# Patient Record
Sex: Female | Born: 1955 | Race: White | Hispanic: No | Marital: Married | Smoking: Never smoker
Health system: Southern US, Community
[De-identification: ages and names within clinical notes are randomized; demographics above are authoritative.]

## PROBLEM LIST (undated history)

## (undated) DIAGNOSIS — E119 Type 2 diabetes mellitus without complications: Secondary | ICD-10-CM

## (undated) DIAGNOSIS — Z923 Personal history of irradiation: Secondary | ICD-10-CM

## (undated) DIAGNOSIS — F319 Bipolar disorder, unspecified: Secondary | ICD-10-CM

## (undated) DIAGNOSIS — Z460 Encounter for fitting and adjustment of spectacles and contact lenses: Secondary | ICD-10-CM

## (undated) DIAGNOSIS — F419 Anxiety disorder, unspecified: Secondary | ICD-10-CM

## (undated) DIAGNOSIS — F32A Depression, unspecified: Secondary | ICD-10-CM

## (undated) DIAGNOSIS — F329 Major depressive disorder, single episode, unspecified: Secondary | ICD-10-CM

## (undated) DIAGNOSIS — C50919 Malignant neoplasm of unspecified site of unspecified female breast: Secondary | ICD-10-CM

## (undated) DIAGNOSIS — M1990 Unspecified osteoarthritis, unspecified site: Secondary | ICD-10-CM

## (undated) HISTORY — DX: Malignant neoplasm of unspecified site of unspecified female breast: C50.919

## (undated) HISTORY — DX: Anxiety disorder, unspecified: F41.9

## (undated) HISTORY — DX: Major depressive disorder, single episode, unspecified: F32.9

## (undated) HISTORY — DX: Bipolar disorder, unspecified: F31.9

## (undated) HISTORY — DX: Depression, unspecified: F32.A

## (undated) HISTORY — PX: WISDOM TOOTH EXTRACTION: SHX21

---

## 1994-07-27 HISTORY — PX: ABDOMINAL HYSTERECTOMY: SHX81

## 1999-05-22 ENCOUNTER — Encounter: Payer: Self-pay | Admitting: Gynecology

## 1999-05-22 ENCOUNTER — Ambulatory Visit (HOSPITAL_COMMUNITY): Admission: RE | Admit: 1999-05-22 | Discharge: 1999-05-22 | Payer: Self-pay | Admitting: Gynecology

## 1999-05-29 ENCOUNTER — Encounter: Payer: Self-pay | Admitting: Gynecology

## 1999-05-29 ENCOUNTER — Ambulatory Visit (HOSPITAL_COMMUNITY): Admission: RE | Admit: 1999-05-29 | Discharge: 1999-05-29 | Payer: Self-pay | Admitting: Gynecology

## 2000-06-01 ENCOUNTER — Encounter: Admission: RE | Admit: 2000-06-01 | Discharge: 2000-06-01 | Payer: Self-pay | Admitting: Gynecology

## 2000-06-01 ENCOUNTER — Encounter: Payer: Self-pay | Admitting: Gynecology

## 2000-07-27 HISTORY — PX: HARDWARE REMOVAL: SHX979

## 2000-07-27 HISTORY — PX: FOOT SURGERY: SHX648

## 2000-09-29 ENCOUNTER — Other Ambulatory Visit: Admission: RE | Admit: 2000-09-29 | Discharge: 2000-09-29 | Payer: Self-pay | Admitting: Gynecology

## 2001-01-31 ENCOUNTER — Observation Stay (HOSPITAL_COMMUNITY): Admission: RE | Admit: 2001-01-31 | Discharge: 2001-02-01 | Payer: Self-pay | Admitting: Orthopedic Surgery

## 2001-01-31 ENCOUNTER — Encounter: Payer: Self-pay | Admitting: Orthopedic Surgery

## 2001-05-03 ENCOUNTER — Ambulatory Visit (HOSPITAL_COMMUNITY): Admission: RE | Admit: 2001-05-03 | Discharge: 2001-05-03 | Payer: Self-pay | Admitting: Orthopedic Surgery

## 2001-05-03 ENCOUNTER — Encounter: Payer: Self-pay | Admitting: Orthopedic Surgery

## 2001-06-03 ENCOUNTER — Encounter: Payer: Self-pay | Admitting: Gynecology

## 2001-06-03 ENCOUNTER — Encounter: Admission: RE | Admit: 2001-06-03 | Discharge: 2001-06-03 | Payer: Self-pay | Admitting: Gynecology

## 2001-06-03 ENCOUNTER — Ambulatory Visit (HOSPITAL_COMMUNITY): Admission: RE | Admit: 2001-06-03 | Discharge: 2001-06-03 | Payer: Self-pay | Admitting: *Deleted

## 2001-11-09 ENCOUNTER — Other Ambulatory Visit: Admission: RE | Admit: 2001-11-09 | Discharge: 2001-11-09 | Payer: Self-pay | Admitting: Obstetrics & Gynecology

## 2001-11-14 ENCOUNTER — Encounter: Payer: Self-pay | Admitting: Obstetrics & Gynecology

## 2001-11-14 ENCOUNTER — Encounter: Admission: RE | Admit: 2001-11-14 | Discharge: 2001-11-14 | Payer: Self-pay | Admitting: Obstetrics & Gynecology

## 2002-11-28 ENCOUNTER — Other Ambulatory Visit: Admission: RE | Admit: 2002-11-28 | Discharge: 2002-11-28 | Payer: Self-pay | Admitting: Obstetrics & Gynecology

## 2004-01-03 ENCOUNTER — Other Ambulatory Visit: Admission: RE | Admit: 2004-01-03 | Discharge: 2004-01-03 | Payer: Self-pay | Admitting: Obstetrics & Gynecology

## 2005-05-04 ENCOUNTER — Other Ambulatory Visit: Admission: RE | Admit: 2005-05-04 | Discharge: 2005-05-04 | Payer: Self-pay | Admitting: Obstetrics & Gynecology

## 2008-11-29 ENCOUNTER — Encounter: Admission: RE | Admit: 2008-11-29 | Discharge: 2008-11-29 | Payer: Self-pay | Admitting: Obstetrics & Gynecology

## 2010-01-14 ENCOUNTER — Encounter: Admission: RE | Admit: 2010-01-14 | Discharge: 2010-01-14 | Payer: Self-pay | Admitting: Obstetrics & Gynecology

## 2010-10-14 ENCOUNTER — Other Ambulatory Visit: Payer: Self-pay | Admitting: Obstetrics & Gynecology

## 2010-10-14 DIAGNOSIS — N6320 Unspecified lump in the left breast, unspecified quadrant: Secondary | ICD-10-CM

## 2010-10-16 ENCOUNTER — Other Ambulatory Visit: Payer: Self-pay

## 2010-10-21 ENCOUNTER — Ambulatory Visit
Admission: RE | Admit: 2010-10-21 | Discharge: 2010-10-21 | Disposition: A | Discharge status: Home / Self Care | Payer: BC Managed Care – PPO | Source: Ambulatory Visit | Attending: Obstetrics & Gynecology | Admitting: Obstetrics & Gynecology

## 2010-10-21 ENCOUNTER — Other Ambulatory Visit: Payer: Self-pay | Admitting: Obstetrics & Gynecology

## 2010-10-21 DIAGNOSIS — N6320 Unspecified lump in the left breast, unspecified quadrant: Secondary | ICD-10-CM

## 2010-12-12 NOTE — Op Note (Signed)
[place]  Patient:    Caitlin Mcneil, Caitlin Mcneil                        MRN: [id number] Proc. Date: 01/31/01 Adm. Date:  [id number] Attending:  Leonides Grills A                           Operative Report  PREOPERATIVE DIAGNOSES: 1. Left hypermobile hallux valgus. 2. Left hallux interphalangeus. 3. Left second hammer toe.  POSTOPERATIVE DIAGNOSES: 1. Left hypermobile hallux valgus. 2. Left hallux interphalangeus. 3. Left second hammer toe.  OPERATION: 1. Left modified ________ procedure. 2. Left Akin osteotomy. 3. Second left dorsal MTP joint capsulotomy. 4. Second left proximal phalanx head resection. 5. Second left FDL to proximal phalanx transfer. 6. Second left EDB to EDL transfer. 7. Local bone graft.  ANESTHESIA:  General endotracheal tube.  SURGEON:  Sherri Rad, M.D.  ASSISTANT:  French Ana Shuford, P.A.-C.  ESTIMATED BLOOD LOSS:  Minimal.  TOURNIQUET TIME:  120 minutes.  COMPLICATIONS:  None.  DISPOSITION:  Stable to PR.  INDICATIONS:  The patient is a 56 year old female who has had bilateral forefoot pain.  She has had pain since February of 2000, and she has tried all conservative management which includes wider toe box shoes and anti-inflammatories.  She has had the second metatarsophalangeal joint injected before and she has had rigid-type deformity of the second hammer toe. She was consented for the above procedure.  All risks and benefits were explained which include infection, nerve or vessel injury, pain, numbness, nonunion, malunion, DVT, recurrence of deformity, and all questions were answered.  DESCRIPTION OF PROCEDURE:  The patient was brought to the operating room and placed in the supine position after adequate general endotracheal tube anesthesia was administered as well as vancomycin 1 g IV piggyback.  The left lower extremity was then prepped and draped in a sterile manner over a proximal placed thigh tourniquet.   A longitudinal incision through the first web space just lateral to the extensor hallucis longus was then made. Dissection was carried down to the first tarsometatarsal joint with an iris scissor, protecting the EHB and EHL retracted medially.  The first tarsometatarsal joint was then opened.  Cartilage was then removed on both sides of the joint.  An osteotomy was then made through the medial cuneiform laterally to remove the lateral wedge to decrease the first and second intermetatarsal angle.  Once this was done, the laminar spreader was then placed into the joint and the plantar aspect of the joint was then visualized.  The capsule was intact.  There was no bone left plantarly that would cause a dorsiflexion deformity at the tarsometatarsal joint.  The joint was then reduced.  A two point reduction clamp was then used to hold this reduction and then a 1.6 mm K-wire was then placed to hold this in the proper position.  After this was done, this was verified under C-arm guidance in the AP and lateral planes, as well as palpated to see if in fact the toe was plantar flexed enough.  It seemed enough and the forefoot seemed to be in a very well-corrected position.  We then dissected through the same incision, though distally and lateral to the first metatarsophalangeal joint capsule was then identified.  A Freer elevator was used to elevate the soft tissues off the lateral aspect of the capsule.  Adductor hallucis was identified.  Just superior to this, a capsulotomy was made laterally.  The first metatarsal head was then translated laterally through the capsular opening.  The adductor tendon was not released.  This had an excellent correction of the hallux valgus.  This had excellent correction of the hallux valgus deformity.  The first tarsometatarsal joint arthrodesis was then repositioned again by backing out the K-wire and removing the clamp, repositioned the clamp, and the K-wire  with the head in the proper position.  Once this was done, the K-wire was then fired across the holding reduction and again this was visualized under C-arm guidance.  The sesamoids were then reduced back underneath the metatarsal head.  There is still persistent interphalangeus deformity of the great toe.  There was also slight angle to the joint itself and it was decided at that point that I did not want to perform a distal metatarsal osteotomy for the fear to cause AVN of the metatarsal head.  It was at this point that an an Akin osteotomy was then made through the proximal phalanx.  This had an excellent correction of the hallux interphalangeus.  This was fixed with two 1.6 mm K-wires in a crisscross manner across the osteotomy.  Then, a bur was then used to create a notch at the base of the first metatarsal, and a ___ mm glide hole was then made into the base of the first metatarsal.  Once this was made in the first metatarsal, then a purchase hole was made into the medial cuneiform.  A 36 mm fully threaded 3.5 mm cortical screw was chosen.  After this was chosen, this was placed and had excellent compression of the first metatarsophalangeal joint fusion.  It was then at this point that the derotational screw was placed between the base of the first and second metatarsal.  This was done in a lag fashion again to close down slightly the lateral aspect of the osteotomy site.  This had held the joint in excellent position.  This again was a 3.5 mm fully threaded cortical screw was used.  We then performed a dorsal incision over the second toe.  Dissection was carried down through soft tissue with the iris scissor.  The EDB and EDL tendons to the second toe were identified.  It was cut longitudinally between the two tendons.  The EDL was tenotomized proximally and medially and the EDB was tenotomized distal laterally at the PIP joint.  The tendons were retracted laterally and medially  respectively and a dorsal capsulotomy of the second metatarsophalangeal joint was then made with collateral release both medially and laterally.   It was at this point that the head of the proximal phalanx was then removed. After that, a longitudinal incision through the plantar plane in the proximal interphalangeal joint was then made.  The flexor digitorum longus was identified, pulled through the wound, and cut.  This was then passed dorsally. A split in the midline of the tendon was then made and the slips of the tendon were passed both medially and laterally around the base of the proximal phalanx underneath the neurovascular bundle.  Once this was done, a K-wire was then placed in an antegrade manner through the middle and distal phalanx and at the tip of the toe.  It was then passed retrograde through the proximal phalanx.  It was at this point that the toe was held in reduced position.  The K-wire was then fired across the second metatarsophalangeal joint, and held in the  proper position with the ankle in neutral position.  With the ankle in neutral position at this point, the FDL tendon slips respectively were sewn together, transferring it to the proximal phalanx. the EDB was transferred then to EDL and sewn to the FDL tendon as well in this region.  The wounds were copiously irrigated with normal saline.  A ________ with bone graft obtained locally were then placed at the first metatarsophalangeal joint osteotomy site.  Final C-arm views were obtained in the AP and lateral position to verify the proper position of the fixation and alignment of the joints.  Earlier, I failed to mention this, a medial incision was made over the first metatarsophalangeal joint.  Dissection was carried down to capsule and ellipse of the capsule was made, and at the time of the end of the procedure, the ellipse was then closed in a vest-over-pants manner with further correction of the hallux  valgus slightly.  The wound was copiously irrigated with normal saline.  The subcutaneous was closed with 3-0 Vicryl.  The skin was closed with 3-0 nylon.  A sterile dressing was applied.  A Jones dressing was applied.  The patient was stable to PR.  POSTOPERATIVE COURSE:  The patient will remain on weightbearing for eight weeks and then weightbearing as tolerated in the Cam walker boot.  We will remove the K-wire of the second toe in five to six weeks.  We will remove the hardware in the proximal phalanx osteotomy as well as the derotational screw, most likely at three to four months. DD:  01/31/01 TD:  02/01/01 Job: [id number]

## 2010-12-12 NOTE — Op Note (Signed)
[facility]  Patient:    Caitlin Mcneil, Caitlin Mcneil Visit Number: [id number] MRN: [id number]          Service Type: DSU Location: DAY Attending Physician:  Sherri Rad Proc. Date: 05/03/01 Admit Date:  05/03/2001                             Operative Report  PREOPERATIVE DIAGNOSES:  Retained hardware, three months status post a left first tarsometatarsal joint correctional arthrodesis, modified McBride bunionectomy, and Aiken osteotomy.  POSTOPERATIVE DIAGNOSES:  Retained hardware, three months status post a left first tarsometatarsal joint correctional arthrodesis, modified McBride bunionectomy, and Aiken osteotomy.  OPERATION:  Derotational screw and K-wire removal deep left foot.  ANESTHESIA:  General endotracheal tube.  SURGEON:  Sherri Rad, M.D.  ASSISTANT:  French Ana Shuford, P.A.-C.  ESTIMATED BLOOD LOSS:  Minimal.  TOURNIQUET:  None.  COMPLICATIONS:  None.  DISPOSITION:  Stable to PR.  INDICATION:  This is a 55 year old female, who is now three months status post a left first tarsometatarsal joint correctional arthrodesis, modified McBride bunionectomy, and Aiken osteotomy.  She presents today for hardware removal. She was explained the risks, including infection, neurovascular injury, possible nonunion, malunion, loss of reduction, possible reimplantation, stiffness were all explained.  Questions were answered.  DESCRIPTION OF PROCEDURE:  The patient was brought to the operating room and placed in the supine position.  After adequate general endotracheal tube anesthesia had been administered as well as vancomycin IV piggyback, the left lower extremity was then prepped and draped in a sterile manner.  A thigh tourniquet was never used.  A medial incision over the head of the derotational screw through the old incision was made.  Screw was removed with the small fragment screwdriver without difficulty.  Screw was intact.  A lateral  incision through the previous wound dorsally was then made.  Toe was plantar flexed, K-wire was identified and pulled out, again, without difficulty.  It was intact without any evidence of failure.  Medial incision over the previous medial wound was then made directly to K-wire.  K-wire was then removed with the needle-nose pliers.  K-wire was intact without evidence of failure.  The wounds were copiously irrigated with normal saline.  Skin and fascia was closed with 4-0 nylon stitch.  Sterile dressing was applied.  The patient was stable to PR. Attending Physician:  Sherri Rad DD:  05/03/01 TD:  05/03/01 Job: [id number]

## 2010-12-12 NOTE — H&P (Signed)
[facility]  Patient:    Caitlin Mcneil, Caitlin Mcneil                        MRN: [id number] Adm. Date:  01/31/01 Attending:  Sherri Rad, M.D. Dictator:   Colleen P. Mahar, P.A.                         History and Physical  DATE OF BIRTH:  [birth year].  CHIEF COMPLAINT:  Left foot and toe pain.  HISTORY OF PRESENT ILLNESS:  Patient has been followed in our clinic for left foot hypermobile hallux valgus with forefoot metatarsalgia.  She obtained more comfortable shoes as well as the _____ temporary orthotic.  She has had persistent bunion pain to the point now that she had previously where it is interfering with her life to the point that she cannot do what she wants to do and needs to do.  She has tried wider shoes and sneakers without any relief. She can only walk for a short period of time at this point, and she has to sit down because of her pain.  This is interfering with her activities of daily living, and she is very frustrated at this point and ready to undergo surgical correction of this problem.  Risks and benefits were discussed with this patient by Dr. Lestine Box, and the patient still wanted to undergo the surgical procedure.  PAST MEDICAL HISTORY: 1. Chronic sinus infections. 2. Bipolar disorder.  PAST SURGICAL HISTORY: 1. Complete hysterectomy in 1996. 2. Tubal ligation in 1989.  ALLERGIES:  She has numerous allergies: 1. PENICILLIN, where she had an anaphylactic, "near-fatal reaction." 2. CEFTIN causes a rash. 3. DOXYCYCLINE causes a rash. 4. LEVAQUIN, rash. 5. ENTEX LA causes a rash. 6. SULFA causes hives and a rash. 7. ASPIRIN causes a rash. 8. ZITHROMAX causes a rash. 9. BIAXIN, rash. 10. IBUPROFEN, rash. 11. TEGRETOL, patient indicated this caused her white blood cell count to fall     drastically. 12. PIROXICAM causes a rash. 13. INDOCIN causes a rash.  MEDICATIONS:  Claritin D the 24 hour tablets one tablet q.d. as  needed, Lamictal 25 mg tablets two tablets q.h.s.  She takes a hormone, Vivelle 0.075 mg patch apply one patch two times weekly.  Estrace vaginal cream 2 g applied vaginally two times weekly.  Testosterone 4% PLO, apply small amount daily. She takes Benadryl 25 mg one time q.h.s. as needed.  FAMILY HISTORY:  Significant for Alzheimers disease, myocardial infarction, coronary artery disease, bipolar disorder, and colon cancer.  REVIEW OF SYSTEMS:  She denied any fevers, chills, night sweats, or bleeding tendencies.  Denied blurred vision, double vision, seizures, headache, or paralysis.  Denies shortness of breath, productive cough, hemoptysis.  Denied chest pain, angina, orthopnea, claudication.  Denied nausea and vomiting, constipation, diarrhea, melena, or bloody stool.  Denied dysuria, hematuria, discharge.  Denied any paresthesias or numbness in the extremities as well as any weakness in the muscles of the extremities.  SOCIAL HISTORY:  Patient denied any tobacco use, denied alcohol use.  She is married and has one 61 year old son.  PHYSICAL EXAMINATION:  VITAL SIGNS:  Blood pressure is 138/96, respirations 14 and unlabored, pulse is 84 and regular.  GENERAL:  The patient is a 55 year old white female in no acute distress.  She is alert and oriented, pleasant, and cooperative to examination.  HEENT:  Head is normocephalic, atraumatic.  Extraocular movement is  intact.  NECK:  No bruits appreciated.  Soft and supple to palpation.  No thyromegaly appreciated.  CHEST:  Clear to auscultation anterior and posterior bilaterally.  BREASTS:  Not pertinent, not examined.  CARDIAC:  Normal S1, S2, regular rate and rhythm.  No murmurs, gallops, or rubs appreciated.  ABDOMEN:  Positive bowel sounds throughout and nontender, nondistended, soft to palpation, no organomegaly appreciated.  GENITOURINARY:  Not pertinent, not examined.  EXTREMITIES:  Left foot:  She has an obvious  hypermobile hallux valgus with a prominent metatarsal head dorsal medially.  She has a transfer callus that is underneath the first metatarsal head on the left foot.  There is a slight rim callus on the tip of the left great toe.  She has a palpable dorsalis pedis and posterior tibialis pulse.  She has slight decreased sensation to light touch over the dorsal medial surface of the great toe as well as underneath the medial plantar nerve area.  The remaining portion of the foot is neurovascularly intact.  She does have a hammer toe on the second toe.  SKIN:  As noted above.  IMAGING STUDIES:  X-rays were reviewed and show a hallux valgus deformity, which has metatarsus primus varus.  There is a question if she has a DMMA angle that may need to be corrected at the time of the procedure with a distal metatarsal osteotomy.  IMPRESSION: 1. Left foot, hypermobile hallux valgus and second hammer toe. 2. Bipolar disorder. 3. Chronic sinus infections. 4. Penicillin allergy causing anaphylactic reaction. 5. Please also note that the patient did write a letter saying that due to    her significant allergy list, she has taken Keflex in the past for her    sinus infections and has not had any problems with that.  The patients    primary care physician is Dr. Georgina Pillion at [facility].  Her    psychiatrist is Dr. Archer Asa.  PLAN:  Admit to [facility] for 23-hour observation on January 31, 2001, and for the surgical correction of the above-listed problem by Dr. Leonides Grills. DD:  01/20/01 TD:  01/20/01 Job: [id number]

## 2012-07-27 DIAGNOSIS — Z923 Personal history of irradiation: Secondary | ICD-10-CM

## 2012-07-27 HISTORY — DX: Personal history of irradiation: Z92.3

## 2013-02-28 ENCOUNTER — Other Ambulatory Visit: Payer: Self-pay | Admitting: Obstetrics & Gynecology

## 2013-02-28 DIAGNOSIS — R928 Other abnormal and inconclusive findings on diagnostic imaging of breast: Secondary | ICD-10-CM

## 2013-03-15 ENCOUNTER — Ambulatory Visit
Admission: RE | Admit: 2013-03-15 | Discharge: 2013-03-15 | Disposition: A | Discharge status: Home / Self Care | Payer: BC Managed Care – PPO | Source: Ambulatory Visit | Attending: Obstetrics & Gynecology | Admitting: Obstetrics & Gynecology

## 2013-03-15 ENCOUNTER — Other Ambulatory Visit: Payer: Self-pay | Admitting: Obstetrics & Gynecology

## 2013-03-15 DIAGNOSIS — R921 Mammographic calcification found on diagnostic imaging of breast: Secondary | ICD-10-CM

## 2013-03-15 DIAGNOSIS — R928 Other abnormal and inconclusive findings on diagnostic imaging of breast: Secondary | ICD-10-CM

## 2013-03-30 ENCOUNTER — Ambulatory Visit
Admission: RE | Admit: 2013-03-30 | Discharge: 2013-03-30 | Disposition: A | Discharge status: Home / Self Care | Payer: BC Managed Care – PPO | Source: Ambulatory Visit | Attending: Obstetrics & Gynecology | Admitting: Obstetrics & Gynecology

## 2013-03-30 DIAGNOSIS — C50919 Malignant neoplasm of unspecified site of unspecified female breast: Secondary | ICD-10-CM

## 2013-03-30 DIAGNOSIS — R921 Mammographic calcification found on diagnostic imaging of breast: Secondary | ICD-10-CM

## 2013-03-30 HISTORY — PX: BREAST BIOPSY: SHX20

## 2013-03-30 HISTORY — DX: Malignant neoplasm of unspecified site of unspecified female breast: C50.919

## 2013-03-31 ENCOUNTER — Telehealth: Payer: Self-pay | Admitting: *Deleted

## 2013-03-31 ENCOUNTER — Other Ambulatory Visit: Payer: Self-pay | Admitting: Obstetrics & Gynecology

## 2013-03-31 DIAGNOSIS — D0511 Intraductal carcinoma in situ of right breast: Secondary | ICD-10-CM

## 2013-03-31 NOTE — Telephone Encounter (Signed)
Received call from patient and confirmed [facility] appt for 04/05/13 at 12N.  No questions or concerns at this time. Instructions and contact information given.

## 2013-04-04 ENCOUNTER — Ambulatory Visit
Admission: RE | Admit: 2013-04-04 | Discharge: 2013-04-04 | Disposition: A | Discharge status: Home / Self Care | Payer: BC Managed Care – PPO | Source: Ambulatory Visit | Attending: Obstetrics & Gynecology | Admitting: Obstetrics & Gynecology

## 2013-04-04 DIAGNOSIS — D0511 Intraductal carcinoma in situ of right breast: Secondary | ICD-10-CM

## 2013-04-05 ENCOUNTER — Encounter: Payer: Self-pay | Admitting: Oncology

## 2013-04-05 ENCOUNTER — Encounter: Payer: Self-pay | Admitting: *Deleted

## 2013-04-05 ENCOUNTER — Ambulatory Visit
Admission: RE | Admit: 2013-04-05 | Discharge: 2013-04-05 | Disposition: A | Discharge status: Home / Self Care | Payer: BC Managed Care – PPO | Source: Ambulatory Visit | Attending: Radiation Oncology | Admitting: Radiation Oncology

## 2013-04-05 ENCOUNTER — Ambulatory Visit (HOSPITAL_BASED_OUTPATIENT_CLINIC_OR_DEPARTMENT_OTHER): Payer: BC Managed Care – PPO | Admitting: Oncology

## 2013-04-05 ENCOUNTER — Ambulatory Visit (HOSPITAL_BASED_OUTPATIENT_CLINIC_OR_DEPARTMENT_OTHER): Payer: BC Managed Care – PPO | Admitting: Surgery

## 2013-04-05 ENCOUNTER — Ambulatory Visit (HOSPITAL_BASED_OUTPATIENT_CLINIC_OR_DEPARTMENT_OTHER): Payer: BC Managed Care – PPO

## 2013-04-05 ENCOUNTER — Ambulatory Visit: Payer: BC Managed Care – PPO | Attending: Surgery | Admitting: Physical Therapy

## 2013-04-05 ENCOUNTER — Other Ambulatory Visit: Payer: Self-pay | Admitting: Emergency Medicine

## 2013-04-05 ENCOUNTER — Telehealth: Payer: Self-pay | Admitting: Oncology

## 2013-04-05 ENCOUNTER — Other Ambulatory Visit (HOSPITAL_BASED_OUTPATIENT_CLINIC_OR_DEPARTMENT_OTHER): Payer: BC Managed Care – PPO | Admitting: Lab

## 2013-04-05 VITALS — BP 126/83 | HR 65 | Temp 98.3°F | Resp 20 | Ht 63.0 in | Wt 168.2 lb

## 2013-04-05 DIAGNOSIS — C50511 Malignant neoplasm of lower-outer quadrant of right female breast: Secondary | ICD-10-CM

## 2013-04-05 DIAGNOSIS — C50919 Malignant neoplasm of unspecified site of unspecified female breast: Secondary | ICD-10-CM

## 2013-04-05 DIAGNOSIS — IMO0001 Reserved for inherently not codable concepts without codable children: Secondary | ICD-10-CM | POA: Insufficient documentation

## 2013-04-05 DIAGNOSIS — Z01818 Encounter for other preprocedural examination: Secondary | ICD-10-CM | POA: Insufficient documentation

## 2013-04-05 DIAGNOSIS — C50519 Malignant neoplasm of lower-outer quadrant of unspecified female breast: Secondary | ICD-10-CM

## 2013-04-05 DIAGNOSIS — D0511 Intraductal carcinoma in situ of right breast: Secondary | ICD-10-CM

## 2013-04-05 DIAGNOSIS — R293 Abnormal posture: Secondary | ICD-10-CM | POA: Insufficient documentation

## 2013-04-05 DIAGNOSIS — D0590 Unspecified type of carcinoma in situ of unspecified breast: Secondary | ICD-10-CM

## 2013-04-05 LAB — COMPREHENSIVE METABOLIC PANEL (CC13)
ALT: 9 U/L (ref 0–55)
Albumin: 3.9 g/dL (ref 3.5–5.0)
Alkaline Phosphatase: 49 U/L (ref 40–150)
BUN: 22.5 mg/dL (ref 7.0–26.0)
Calcium: 9.1 mg/dL (ref 8.4–10.4)
Creatinine: 0.7 mg/dL (ref 0.6–1.1)
Glucose: 113 mg/dl (ref 70–140)
Potassium: 4.1 mEq/L (ref 3.5–5.1)

## 2013-04-05 LAB — CBC WITH DIFFERENTIAL/PLATELET
BASO%: 0.1 % (ref 0.0–2.0)
EOS%: 0.1 % (ref 0.0–7.0)
Eosinophils Absolute: 0 10*3/uL (ref 0.0–0.5)
HCT: 39.4 % (ref 34.8–46.6)
MCH: 30.7 pg (ref 25.1–34.0)
MCV: 91.6 fL (ref 79.5–101.0)
MONO#: 0.5 10*3/uL (ref 0.1–0.9)
NEUT#: 6.6 10*3/uL — ABNORMAL HIGH (ref 1.5–6.5)
NEUT%: 76.4 % (ref 38.4–76.8)
lymph#: 1.5 10*3/uL (ref 0.9–3.3)

## 2013-04-05 NOTE — Patient Instructions (Signed)
Sentinel Lymph Node Analysis in Breast Cancer Treatment WHAT IS A LYMPH NODE? Lymph nodes are little glands that lie along the lymph channels and serve to trap infections in the body. These are the small vessel-like structures that carry the fluid (lymph) from body tissues away to be filtered. These are the "glands" that feel swollen in the neck when you or your child has a sore throat. These glands in your armpit are where breast cancer tends to spread first. WHAT IS SENTINEL LYMPH NODE ANALYSIS? Sentinel lymph node study is a new cancer diagnostic procedure. It aims to look at the most likely first spread of breast cancer. It involves looking at the lymph node or nodes where breast cancer is likely to travel next.  PROCEDURE  A small amount of blue dye and radioactive tracer are injected around the tumor in the breast. The dye and tracer follow the same path that a spreading cancer would be likely to follow. With the use of a Conservation officer, nature, the radioactive tracer can be located in the lymph node that is the gatekeeper to other lymph nodes in the armpit. The sentinel lymph node is the first lymph node in a chain of lymph nodes that drain the lymph from the breast. The blue dye enables the surgeon to identify this sentinel node. This node can be removed and examined. If no cancer is found in this node, no further removal of lymph nodes is recommended. In this case, the spread of cancer to the other lymph nodes is rare. This eliminates any more surgery in the armpit and risk of complications. If the lymph node shows spread of the cancer from the breast, the other lymph nodes in the armpit are removed and analyzed. This helps the doctor and patient decide how much more surgery is needed and if chemotherapy and/or radiation treatment is necessary following the surgery.  BENEFITS  The pathology tests for this procedure are much more sensitive than was previously available.  This technique is thought to be a  major improvement in the treatment of breast cancer.  This procedure allows many patients to avoid the effects of more extensive underarm lymph node removal and risk of complications (infection, bleeding, or severe arm swelling).  Survival rates from breast cancer are better and the risk of complications isreduced. Document Released: 07/13/2005 Document Revised: 10/05/2011 Document Reviewed: 03/29/2007 [facility] Patient Information 2014 [place], [place]. Lumpectomy, Breast Conserving Surgery A lumpectomy is breast surgery that removes only part of the breast. Another name used may be partial mastectomy. The amount removed varies. Make sure you understand how much of your breast will be removed. Reasons for a lumpectomy:  Any solid breast mass.  Grouped significant nodularity that may be confused with a solitary breast mass. Lumpectomy is the most common form of breast cancer surgery today. The surgeon removes the portion of your breast which contains the tumor (cancer). This is the lump. Some normal tissue around the lump is also removed to be sure that all the tumor has been removed.  If cancer cells are found in the margins where the breast tissue was removed, your surgeon will do more surgery to remove the remaining cancer tissue. This is called re-excision surgery. Radiation and/or chemotherapy treatments are often given following a lumpectomy to kill any cancer cells that could possibly remain.  REASONS YOU MAY NOT BE ABLE TO HAVE BREAST CONSERVING SURGERY:  The tumor is located in more than one place.  Your breast is small and the  tumor is large so the breast would be disfigured.  The entire tumor removal is not successful with a lumpectomy.  You cannot commit to a full course of chemotherapy, radiation therapy or are pregnant and cannot have radiation.  You have previously had radiation to the breast to treat cancer. HOW A LUMPECTOMY IS PERFORMED If overnight nursing is not required  following a biopsy, a lumpectomy can be performed as a same-day surgery. This can be done in a hospital, clinic, or surgical center. The anesthesia used will depend on your surgeon. They will discuss this with you. A general anesthetic keeps you sleeping through the procedure. LET YOUR CAREGIVERS KNOW ABOUT THE FOLLOWING:  Allergies  Medications taken including herbs, eye drops, over the counter medications, and creams.  Use of steroids (by mouth or creams)  Previous problems with anesthetics or Novocaine.  Possibility of pregnancy, if this applies  History of blood clots (thrombophlebitis)  History of bleeding or blood problems.  Previous surgery  Other health problems BEFORE THE PROCEDURE You should be present one hour prior to your procedure unless directed otherwise.  AFTER THE PROCEDURE  After surgery, you will be taken to the recovery area where a nurse will watch and check your progress. Once you're awake, stable, and taking fluids well, barring other problems you will be allowed to go home.  Ice packs applied to your operative site may help with discomfort and keep the swelling down.  A small rubber drain may be placed in the breast for a couple of days to prevent a hematoma from developing in the breast.  A pressure dressing may be applied for 24 to 48 hours to prevent bleeding.  Keep the wound dry.  You may resume a normal diet and activities as directed. Avoid strenuous activities affecting the arm on the side of the biopsy site such as tennis, swimming, heavy lifting (more than 10 pounds) or pulling.  Bruising in the breast is normal following this procedure.  Wearing a bra - even to bed - may be more comfortable and also help keep the dressing on.  Change dressings as directed.  Only take over-the-counter or prescription medicines for pain, discomfort, or fever as directed by your caregiver. Call for your results as instructed by your surgeon. Remember it is  your responsibility to get the results of your lumpectomy if your surgeon asked you to follow-up. Do not assume everything is fine if you have not heard from your caregiver. SEEK MEDICAL CARE IF:   There is increased bleeding (more than a small spot) from the wound.  You notice redness, swelling, or increasing pain in the wound.  Pus is coming from wound.  An unexplained oral temperature above 102 F (38.9 C) develops.  You notice a foul smell coming from the wound or dressing. SEEK IMMEDIATE MEDICAL CARE IF:   You develop a rash.  You have difficulty breathing.  You have any allergic problems. Document Released: 08/24/2006 Document Revised: 10/05/2011 Document Reviewed: 11/25/2006 [facility] Patient Information 2014 [place], [place].

## 2013-04-05 NOTE — Progress Notes (Signed)
Checked in new patient with no financial issues. Mail, phone and email and I gave [employer].

## 2013-04-05 NOTE — Patient Instructions (Addendum)
Proceed with surgery first  I will see you back in 2 months  BRCA-1 and BRCA-2 BRCA-1 and BRCA-2 are 2 genes that are linked with hereditary breast and ovarian cancers. About 200,000 women are diagnosed with invasive breast cancer each year and about 23,000 with ovarian cancer (according to the American Cancer Society). Of these cancers, about 5% to 10% will be due to a mutation in one of the BRCA genes. Men can also inherit an increased risk of developing breast cancer, primarily from an alteration in the BRCA-2 gene.  Individuals with mutations in BRCA1 or BRCA2 have significantly elevated risks for breast cancer (up to 80% lifetime risk), ovarian cancer (up to 40% lifetime risk), bilateral breast cancer and other types of cancers. BRCA mutations are inherited and passed from generation to generation. One half of the time, they are passed from the father's side of the family.  The DNA in white blood cells is used to detect mutations in the BRCA genes. While the gene products (proteins) of the BRCA genes act only in breast and ovarian tissue, the genes are present in every cell of the body and blood is the most easily accessible source of that DNA. PREPARATION FOR TEST The test for BRCA mutations is done on a blood sample collected by needle from a vein in the arm. The test does not require surgical biopsy of breast or ovarian tissue.  NORMAL FINDINGS No genetic mutations. Ranges for normal findings may vary among different laboratories and hospitals. You should always check with your doctor after having lab work or other tests done to discuss the meaning of your test results and whether your values are considered within normal limits. MEANING OF TEST  Your caregiver will go over the test results with you and discuss the importance and meaning of your results, as well as treatment options and the need for additional tests if necessary. OBTAINING THE TEST RESULTS It is your responsibility to obtain  your test results. Ask the lab or department performing the test when and how you will get your results. OTHER THINGS TO KNOW Your test results may have implications for other family members. When one member of a family is tested for BRCA mutations, issues often arise about how or whether to share this information with other family members. Seek advice from a genetic counselor about communication of result with your family members.  Pre and post test consultation with a health care provider knowledgeable about genetic testing cannot be overemphasized.  There are many issues to be considered when preparing for a genetic test and upon learning the results, and a genetic counselor has the knowledge and experience to help you sort through them.  If the BRCA test is positive, the options include increased frequency of check-ups (e.g., mammography, blood tests for CA-125, or transvaginal ultrasonography); medications that could reduce risk (e.g., oral contraceptives or tamoxifen); or surgical removal of the ovaries or breasts. There are a number of variables involved and it is important to discuss your options with your doctor and genetic counselor. Research studies have reported that for every 1000 women negative for BRCA mutations, between 12 and 45 of them will develop breast cancer by age 28 and between 3 and 4 will develop ovarian cancer by age 58. The risk increases with age. The test can be ordered by a doctor, preferably by one who can also offer genetic counseling. The blood sample will be sent to a laboratory that specializes in BRCA testing. The American  Society of Clinical Oncology and the National Breast Cancer Coalition encourage women seeking the test to participate in long-term outcome studies to help gather information on the effectiveness of different check-up and treatment options. Document Released: 08/06/2004 Document Revised: 10/05/2011 Document Reviewed: 06/18/2008 [facility] Patient  Information 2014 [place], [place].  Breast Cancer, Early Stage, Surgery Choices Breast cancer is the most common cancer, except for skin cancer. It is the second most common cause of death, except for lung cancer, in women. The risk of a woman developing breast cancer is 12.5%. Breast cancer in women younger than 56 years old is rare. Most of these cancer cases have spread to the breast from another part of the body (metastatic). Finding out that you have breast cancer is an emotional shock and is difficult to understand, because it is an overwhelming, serious, and life-threatening disease. You will need to make important decisions about how you want it treated.  As a woman with early-stage breast cancer (DCIS or Stage I, IIA, IIB, IIIA, or IV) you may be able to choose which type of breast surgery to have. Your caregiver will help you understand what stage you are in. Often, your choices are:  Removal of the cancerous part(s) of the breast (breast-sparing surgery).  Lumpectomy.  Partial/Segmental mastectomy.  Removal of the whole breast (simple mastectomy). Research shows that women with early-stage breast cancer who have breast-sparing surgery along with radiation therapy live as long as those who have a mastectomy. Most women with breast cancer will lead long, healthy lives after treatment.  Reconstructive surgery. This type of surgery is to make the breast and nipple area as normal looking as it was before the mastectomy. It is usually done by a plastic surgeon at the same time as the mastectomy. There are several types of reconstructive surgery that should be discussed with your caregiver and plastic surgeon.  Lymph node surgery.  Sentinel (removing those lymph nodes in the armpit that breast cancer spreads to first).  Axillary lymph node dissection (removing all the lymph nodes in the armpit). TREATMENT Treatment for breast cancer usually begins a few weeks after diagnosis. In these weeks,  you should meet with a surgeon, learn the facts about your surgery choices, treatment options, get a second opinion, and think about what is important to you.  Treatment choices include:  Surgery.  Radiation.  Chemotherapy.  Medicines, hormones.  Reconstructive breast surgery.  Any combination of the above treatments. Choose which kind of surgery to have. If radiation, chemotherapy, or hormone therapy is considered, this also should be discussed before the surgery. Radical breast surgery is rarely performed now. Usually, lumpectomy, partial/segmental mastectomy, simple mastectomy in combination with radiation, chemotherapy, and/or hormone treatment is recommended. Clinical trial protocols (specific treatment plan with surgery and radiation, chemotherapy, and hormones) for breast cancer have been put in place in hospitals, universities, medical schools, and cancer centers all over the country. These patients are followed for several years to find out what treatment gives the best results for the protocol given, for the different stages of breast cancer.  Most women want to make this choice with help from their caregiver. After all, the kind of surgery you have will affect how you look and feel. But it is often hard to decide what to do. The more information you have, the better the decision you can make.  Talk to a surgeon about your breast cancer surgery choices. Find out what happens during surgery, types of problems that sometimes occur, and  other kinds of treatment (if any) you will need after surgery. Be sure to ask a lot of questions and learn as much as you can. You may also wish to talk with family members, friends, or others who have had breast cancer surgery, radiation therapy, chemotherapy, or hormone therapy.  After talking with a surgeon, you may want a second opinion. This means talking with another doctor or surgeon who might tell you about other treatment options. They may simply  give you information that can help you feel better about the choice you are making. Do not worry about hurting your surgeon's feelings. It is common practice to get a second opinion, and some insurance companies require it. Plus, it is better to get a second opinion than to worry that you have made the wrong choice. STAGES OF BREAST CANCER Staging of breast cancer depends on the size of the tumor, if and how far it has spread, lymph node involvement in the armpits, and whether it has spread to other parts of the body. If you are unsure of the stage of your cancer, ask your caregiver. The following are the stages of breast cancer:  Stage 0: This means you either have DCIS or LCIS.  DCIS (Ductal Carcinoma in Situ) is very early breast cancer. It is often too small to form a lump. Your caregiver may refer to DCIS as noninvasive cancer.  LCIS (Lobular Carcinoma in Situ) is not cancer, but it may increase the chance that you will get breast cancer. Talk with your caregiver about treatment options, if you are diagnosed with LCIS.  The 5 year survival rate in this stage is almost 100%.  Stage I:  Your cancer is less than 1 inch across (2 centimeters) or about the size of a quarter. The cancer is only in the breast. It has not spread to lymph nodes or other parts of your body.  Stage IIA:  No cancer is found in your breast. However, cancer is found in the lymph nodes under your arm, or  Your cancer is 1 inch (2 centimeters) or smaller. It has spread to 3 lymph nodes or less (the lymph nodes under your arm), or  Your cancer is about 1-2 inches (2-5 centimeters). It has not spread to the lymph nodes under your arm.  Stage IIB:  Your cancer is about 1-2 inches (2-5 centimeters). It has spread to the lymph nodes under your arm, or  Your cancer is larger than 2 inches (5 centimeters). It has not spread to the lymph nodes under your arm.  The 5 year survival rate is 85 to 90%.  Stage IIIA:  No  cancer is found in the breast. It is found in lymph nodes under your arm. The lymph nodes are attached to each other, or  Your cancer is 2 inches (5 centimeters) or smaller. It has spread to lymph nodes under your arm. The lymph nodes are attached to each other, or  Your cancer is larger than 2 inches (5 centimeters) and has spread to lymph nodes under your arm, and they are not attached to each other.  Your cancer is smaller than 2 inches (5 centimeters) and has spread to lymph nodes above your collarbone.  Inflammatory cancer of the breast (red rash, tender and swollen breasts) is in the stage III category.  The 5 year survival rate is 45 to 67%.  Stage IV:  Cancer cells have spread to other parts of the body.  The 5 year survival  rate is about 20%. ABOUT LYMPH NODES  Lymph nodes are part of your body's immune system, which helps fight infection and disease. Lymph nodes are small, round, and clustered (like a bunch of grapes) throughout your body.  Axillary lymph nodes are in the area under your arm. Breast cancer may spread to these lymph nodes first, even when the tumor in the breast is small. This is why most surgeons take out some of these lymph nodes at the time of breast surgery.  Lymphedema is swelling caused by a buildup of lymph fluid. You may have this type of swelling in your arm, if your lymph nodes are taken out with surgery or damaged by radiation therapy.  Lymphedema can show up soon after surgery. The symptoms are often mild and last for a short time.  Lymphedema can show up months or even years after cancer treatment is over. Often, lymphedema develops after an insect bite, minor injury, or burn on the arm where your lymph nodes were removed. Sometimes, this can be painful. One way to reduce the swelling is to work with a caregiver who specializes in rehabilitation, or with a physical therapist.  Sentinel lymph node biopsy is surgery to remove as few lymph nodes as  possible from under the arm. The surgeon first injects a dye in the breast to see which lymph nodes the breast tumor drains into. Then, he or she removes these nodes to see if they have any cancer. If there is no cancer, the surgeon may leave the other lymph nodes in place. This surgery is fairly new and is being studied experimentally. Talk with your surgeon if you want to learn more. COMPARE YOUR CHOICES  BREAST-SPARING SURGERY  Is this surgery right for me?   Breast-sparing surgery with radiation is a safe choice for most women who have early-stage breast cancer. This means that your cancer is DCIS or at Stage I, IIA, IIB, or IIIA. What are the names of the different kinds of surgery?  Lumpectomy.  Partial mastectomy.  Breast-sparing surgery.  Segmental mastectomy.  Simple mastectomy.  Sentinel lymph node removal.  Axillary lymph node excision. What doctors am I likely to see?  Oncologist.  Surgeon.  Radiation Oncologist.  Chemotherapy Oncologist. What will my breast look like after surgery?  Your breast should look a lot like it did before surgery. But if your tumor is large, your breast may look different or smaller after breast-sparing surgery. Will I have feeling in the area around my breast?  Yes. You should still have feeling in your breast, nipple, and areola (dark area around your nipple). Will I have pain after the surgery?  You may have pain after surgery. Talk with your caregiver about ways to control this pain. What other problems can I expect?  You may feel very tired after radiation therapy. You may get swelling in your arm (lymphedema). Will I need more surgery?  Maybe. You may need more surgery to remove lymph nodes from under your arm. Also, if the surgeon does not remove all your cancer the first time, you may need more surgery. What other types of treatment will I need?  You may need radiation therapy, given almost every day for 5 to 8 weeks. You  may also need chemotherapy, hormone therapy, or both. Will insurance pay for my surgery?  Check with your insurance company to find out how much it pays for breast cancer surgery and other needed treatments. Will the type of surgery I have  affect how long I live?  Women with early-stage breast cancer who have breast-sparing surgery followed by radiation live just as long as women who have a mastectomy. Most women with breast cancer will lead long, healthy lives after treatment. What are the chances that my cancer will come back after surgery?  About 10% of women who have breast-sparing surgery along with radiation therapy get cancer in the same breast within 12 years. If this happens, you will need a mastectomy, but it will not affect how long you live. MASTECTOMY SURGERY Is this surgery right for me?   Mastectomy is a safe choice for women who have early-stage breast cancer (DCIS, Stage I, IIA, IIB, or IIIA). You may need a mastectomy if:  You have small breasts and a large tumor.  You have cancer in more than one part of your breast.  The tumor is under the nipple.  You do not have access to radiation therapy. What are the names of the different kinds of surgery?  Total mastectomy.  Modified radical mastectomy (not usually done now).  Double mastectomy.  Simple mastectomy. What doctors am I likely to see?  Oncologist.  Surgeon.  Radiation Oncologist.  Chemotherapy Oncologist. What will my breast look like after surgery?  Your breast and nipple will be removed. You will have a flat chest on the side of your body where the breast was removed. Will I have feeling in the area around my breast?  Maybe. After surgery, you will have no feeling (numb) in your chest wall and maybe also under your arm. This numb feeling should go away in 1-2 years, but it will never feel like it did before. Also, the skin where your breast was may feel tight. Will I have pain after the  surgery?  You may have pain after surgery. Talk with your caregiver about ways to control this pain. What other problems can I expect?  You may have pain in your neck or back. You may feel out of balance, if you had large breasts and do not have reconstruction surgery. You may get swelling in your arm (lymphedema). Will I need more surgery?  Maybe. You may need surgery to remove lymph nodes from under your arm. Also, if you have problems after your mastectomy, you may need to see your surgeon for treatment. What other types of treatment will I need?  You may also need chemotherapy, hormone therapy, or radiation therapy. Some women get all 3 types of therapy or any combination of the 3. Will insurance pay for my surgery?  Check with your insurance company to find out how much it pays for breast cancer surgery and other needed treatments. Will the type of surgery I have affect how long I live?  Women with early-stage breast cancer who have a mastectomy live as long as women who have breast-sparing surgery followed by radiation therapy. Most women with breast cancer will lead long, healthy lives after treatment. What are the chances that my cancer will come back after surgery?  About 5% of women who have a mastectomy will get cancer on the same side of their chest within 12 years. MASTECTOMY AND BREAST RECONSTRUCTION SURGERY  Is this surgery right for me?  If you have a mastectomy, you might also want breast reconstruction surgery.  You can choose to have reconstruction surgery at the same time as your mastectomy.  You can wait and have it at a later date. What are the names of the different kinds  of surgery?  Breast implant.  Tissue flap surgery.  Reconstructive plastic surgery. What doctors am I likely to see?  Oncologist.  Surgeon.  Radiation Oncologist.  Reconstructive Plastic Surgeon.  Chemotherapy Oncologist. What will my breast look like after surgery?  Although  you will have a breast-like shape, your breast will not look the same as it did before surgery. Will I have feeling in the area around my breast?  No. The area around your breast will always be numb (have no feeling). Will I have pain after the surgery?  You are likely to have pain after major surgery, such as mastectomy and reconstruction surgery. There are many ways to deal with pain. Let your caregiver know if you need relief from pain. What other problems can I expect?  It may take you many weeks or even months to recover from breast reconstruction surgery. If you have an implant, you may get infections, pain, or hardness. Also, you may not like how your breast-like shape looks. You may need more surgery if your implant breaks or leaks. If you have tissue flap surgery, you may lose strength in the part of your body where the flap came from. You may get swelling in your arm (lymphedema). Will I need more surgery?  Yes. You will need surgery at least 2 more times to build a new breast-like shape. With implants, you may need more surgery months or years later. You may also need surgery to remove lymph nodes from under your arm. What other types of treatment will I need?  You may need chemotherapy, hormone therapy, or radiation therapy. Some women get all 3 types of therapy or any combination of the 3. Will insurance pay for my surgery?  Check with your insurance company to find out if it pays for breast reconstruction surgery. You should also ask if your insurance will pay for problems that may result from breast reconstruction surgery. Will the type of surgery I have affect how long I live?  Women with early-stage breast cancer who have a mastectomy live the same amount of time as women who have breast-sparing surgery followed by radiation therapy. Most women with breast cancer will lead long, healthy lives after treatment. What are the chances that my cancer will come back after  surgery?  About 5% of women who have a mastectomy will get cancer on the same side of their chest within 12 years. Breast reconstruction surgery does not affect the chances of your cancer coming back. Where can I learn more about coping with life after cancer? To learn more about life after cancer, you might want to read "Facing Forward: Life After Cancer Treatment." You can get this booklet at [employer] or by calling 1-800-4-CANCER. THINK ABOUT WHAT IS IMPORTANT TO YOU   After you have talked with your surgeon and learned the facts, you may also want to talk with your spouse or partner, family, friends, or other women who have had breast cancer surgery. The more you know about breast cancer, the treatment, and what happens afterward, the more informed you will be and the easier it will be for you to make good decisions that are important to you.  Think about what is important to you. Here are some questions to think about:  Do I want to get a second opinion?  How important is it to me how my breast looks after cancer surgery?  How important is it to me how my breast feels after cancer surgery?  If  I have breast-sparing surgery or more extensive surgery, am I willing to get radiation, hormone and/or chemotherapy?  If I have a mastectomy, do I also want breast reconstruction surgery?  If I have breast reconstruction surgery, do I want it at the same time as my mastectomy?  What treatment does my insurance cover, and what do I need to pay for?  Who would I like to talk with about my surgery, radiation, hormone, and chemotherapy choices?  What else do I want to know, do, or learn before I make my choice about breast cancer surgery?  After I have learned all I could and have talked with my surgeon, I will make a choice that feels right for me.  A patient who takes the time to be well-informed will feel more comfortable about her decisions regarding surgery, and will feel  better about the procedure following the surgery.  Coping and Support:  Be open and willing to talk to your family and friends about your cancer. Family and friends can be your best support group, to help you work through your concerns and worries.  Discussing your thoughts, concerns, and intimacy issues with your spouse or partner is very important and helpful.  Join support groups to share and learn how others with breast cancer cope and deal with their cancer. The American Cancer Society can help you find support groups in your area.  Breast cancer may affect your confidence and feelings about being a total woman. It may interfere with your intimate relationship with your partner. Counseling may be necessary to help you overcome these feelings.  Talk to a counselor, your clergyperson, psychologist, or psychiatrist.  Talk to a medical social worker, if you have financial questions or problems.  Do not be afraid to ask for help, especially during your treatment.  Learn to be as independent as possible, as soon as possible. Document Released: 10/03/2003 Document Revised: 10/05/2011 Document Reviewed: 06/10/2009 [facility] Patient Information 2014 [place], [place].

## 2013-04-05 NOTE — Progress Notes (Signed)
Patient ID: Kadeshia Kasparian, female   DOB: 01-11-1956, 57 y.o.   MRN: [id number]  No chief complaint on file.   HPI Bryona Foxworthy is a 57 y.o. female.  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  No other symptoms. HPI  Past Medical History  Diagnosis Date  . Bipolar 1 disorder   . Anxiety   . Depression     Past Surgical History  Procedure Laterality Date  . Abdominal hysterectomy    . Foot surgery      Family History  Problem Relation Age of Onset  . Colon cancer Father     Social History History  Substance Use Topics  . Smoking status: Never Smoker   . Smokeless tobacco: Not on file  . Alcohol Use: No    Allergies  Allergen Reactions  . Aspirin   . Penicillins     Current Outpatient Prescriptions  Medication Sig Dispense Refill  . Acetaminophen (TYLENOL ARTHRITIS PAIN PO) Take 2 tablets by mouth 2 (two) times daily.      Marland Kitchen estradiol (MINIVELLE) 0.075 MG/24HR Place 1 patch onto the skin 2 (two) times a week.      . Estradiol (VAGIFEM) 10 MCG TABS vaginal tablet Place vaginally 3 (three) times a week.      . pregabalin (LYRICA) 100 MG capsule Take 100 mg by mouth 2 (two) times daily.      . temazepam (RESTORIL) 15 MG capsule Take 30 mg by mouth at bedtime as needed for sleep.      . ziprasidone (GEODON) 40 MG capsule Take 40 mg by mouth daily with supper.      . zolpidem (AMBIEN CR) 12.5 MG CR tablet Take 12.5 mg by mouth at bedtime as needed for sleep.       No current facility-administered medications for this visit.    Review of Systems Review of Systems  Constitutional: Negative for fever, chills and unexpected weight change.  HENT: Negative for hearing loss, congestion, sore throat, trouble swallowing and voice change.   Eyes: Negative for visual disturbance.  Respiratory: Negative for cough and wheezing.   Cardiovascular: Negative for chest pain, palpitations and leg swelling.  Gastrointestinal: Negative for  nausea, vomiting, abdominal pain, diarrhea, constipation, blood in stool, abdominal distention and anal bleeding.  Genitourinary: Negative for hematuria, vaginal bleeding and difficulty urinating.  Musculoskeletal: Negative for arthralgias.  Skin: Negative for rash and wound.  Neurological: Negative for seizures, syncope and headaches.  Hematological: Negative for adenopathy. Does not bruise/bleed easily.  Psychiatric/Behavioral: Negative for confusion.    There were no vitals taken for this visit.  Physical Exam Physical Exam  Constitutional: She is oriented to person, place, and time. She appears well-developed and well-nourished.  HENT:  Head: Normocephalic and atraumatic.  Eyes: EOM are normal. Pupils are equal, round, and reactive to light.  Neck: Normal range of motion. Neck supple.  Cardiovascular: Normal rate and regular rhythm.   Pulmonary/Chest: Effort normal and breath sounds normal. Right breast exhibits no inverted nipple, no mass, no nipple discharge, no skin change and no tenderness. Left breast exhibits no inverted nipple, no mass, no nipple discharge, no skin change and no tenderness. Breasts are symmetrical.    Musculoskeletal: Normal range of motion.  Lymphadenopathy:    She has no cervical adenopathy.  Neurological: She is alert and oriented to person, place, and time.  Skin: Skin is warm and dry.  Psychiatric: She has a normal  mood and affect. Her behavior is normal. Judgment and thought content normal.    Data Reviewed Clinical Data: 57 year old female with abnormal screening  mammogram - right breast calcifications. Also palpable left breast  lump discovered on self examination.  DIGITAL DIAGNOSTIC BILATERAL MAMMOGRAM AND LEFT BREAST ULTRASOUND:  Comparison: 02/23/2013 screening mammogram and prior mammograms  dating back to 10/20/2007  Findings:  ACR Breast Density Category d: The breast tissue is extremely  dense, which lowers the sensitivity of  mammography.  Magnification views of the right breast demonstrate a 2 x 5 mm  group of heterogeneous calcifications within the anterior lower  outer right breast. No definite associated mass is noted. There is  A circumscribed oval mass within the upper left breast is present.  On physical exam, a firm palpable mobile mass is identified at the  10 o'clock position of the left breast 4 cm from the nipple.  Ultrasound is performed, showing a 1.9 x 1.6 x 1.8 cm benign simple  cyst at the 10 o'clock position of the left breast 4 cm from the  nipple, corresponding to the area of patient concern.  IMPRESSION:  Indeterminate calcifications within the anterior lower outer right  breast - tissue sampling is recommended to exclude DCIS.  Benign simple cyst in the upper inner left breast, corresponding to  the area of palpable concern.  BI-RADS CATEGORY 4: Suspicious abnormality - biopsy should be  considered.  RECOMMENDATION:  Stereotactic guided right breast biopsy, which will be scheduled by  our office.  I have discussed the findings and recommendations with the patient.  Results were also provided in writing at the conclusion of the  visit. If applicable, a reminder letter will be sent to the  patient regarding the next appointment.  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  discueed other options and reconstruction.The procedure has been discussed with the patient. Alternatives to surgery have been discussed with the patient.  Risks of surgery include bleeding,  Infection,  Seroma formation, death,  and the need for further surgery.   The patient understands and wishes to proceed.Sentinel lymph node mapping and dissection has been discussed with the patient.  Risk of bleeding,  Infection,  Seroma formation,   Additional procedures,,  Shoulder weakness ,  Shoulder stiffness,  Nerve and blood vessel injury and reaction to the mapping dyes have been discussed.  Alternatives to surgery have been discussed with the patient.  The patient agrees to proceed.       Arlee Santosuosso A. 04/05/2013, 3:16 PM

## 2013-04-05 NOTE — Telephone Encounter (Signed)
, °

## 2013-04-05 NOTE — Progress Notes (Signed)
Caitlin Mcneil [id number] July 15, 1956 57 y.o. 04/05/2013 3:41 PM  CC  Freddy Finner, MD [address] [place] [address] Dr. Harriette Bouillon Dr. Dorothy Puffer  REASON FOR CONSULTATION:  57 year old female with new diagnosis of breast cancer of lower-outer quadrant of the right breast. Patient was seen in the multidisciplinary breast clinic for discussion of treatment options.  STAGE:   Breast cancer of lower-outer quadrant of right female breast   Primary site: Breast (Right)   Staging method: AJCC 7th Edition   Clinical: Stage 0 (Tis (DCIS), N0, cM0)   Summary: Stage 0 (Tis (DCIS), N0, cM0)  REFERRING PHYSICIAN: Dr. [name] Cornett  HISTORY OF PRESENT ILLNESS:  Caitlin Mcneil is a 57 y.o. female.  With bipolar disorder and neuropathy. Patient had a screening mammogram performed that showed calcifications in the right breast. She had magnification views of the right breast that showed 2 x 5 mm of the calcifications within the lower outer quadrant. A circumscribed oval mass was also present in the upper left breast representing a benign or nipple cyst on ultrasound. 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  Patient had an MRI scan attempted but it was not performed due to claustrophobia.  Her pathology and radiology were reviewed at the multidisciplinary breast conference. She is now seen in the multidisciplinary breast clinic for discussion of treatment options.   Past Medical History: Past Medical History  Diagnosis Date  . Bipolar 1 disorder   . Anxiety   . Depression     Past Surgical History: Past Surgical History  Procedure Laterality Date  . Abdominal hysterectomy    . Foot surgery      Family History: Family History  Problem Relation Age of Onset  . Colon cancer Father     Social History History   Substance Use Topics  . Smoking status: Never Smoker   . Smokeless tobacco: Not on file  . Alcohol Use: No    Allergies: Allergies  Allergen Reactions  . Aspirin   . Penicillins     Current Medications: Current Outpatient Prescriptions  Medication Sig Dispense Refill  . Acetaminophen (TYLENOL ARTHRITIS PAIN PO) Take 2 tablets by mouth 2 (two) times daily.      Marland Kitchen estradiol (MINIVELLE) 0.075 MG/24HR Place 1 patch onto the skin 2 (two) times a week.      . Estradiol (VAGIFEM) 10 MCG TABS vaginal tablet Place vaginally 3 (three) times a week.      . pregabalin (LYRICA) 100 MG capsule Take 100 mg by mouth 2 (two) times daily.      . temazepam (RESTORIL) 15 MG capsule Take 30 mg by mouth at bedtime as needed for sleep.      . ziprasidone (GEODON) 40 MG capsule Take 40 mg by mouth daily with supper.      . zolpidem (AMBIEN CR) 12.5 MG CR tablet Take 12.5 mg by mouth at bedtime as needed for sleep.       No current facility-administered medications for this visit.    OB/GYN History:menarche at age 27 patient is postmenopausal she has been on hormone replacement therapy for 18 years. She is currently using them and I asked her to discontinue. Patient's first live birth was at 69.  Fertility Discussion:not applicable Prior History of Cancer:no prior  Health Maintenance:  Colonoscopyjust 2007 Bone Density2013 Last PAP smearup to  date  ECOG PERFORMANCE STATUS: 1 - Symptomatic but completely ambulatory  Genetic Counseling/testing:no  REVIEW OF SYSTEMS: Comprehensive review of systems was obtained. It was significant for anxiety depression phobia as bruising easily as well as arthritis. Patient also complains of fatigue pain in her feet. Remainder of the 14 point review of systems was negative.  PHYSICAL EXAMINATION: Blood pressure 126/83, pulse 65, temperature 98.3 F (36.8 C), temperature source Oral, resp. rate 20, height 5\' 3"  (1.6 m), weight 168 lb 3.2 oz (76.295  kg).  AVW:UJWJX, healthy, no distress, well nourished, well developed and anxious SKIN: skin color, texture, turgor are normal HEAD: Normocephalic EYES: PERRLA, EOMI, Conjunctiva are pink and non-injected EARS: External ears normal OROPHARYNX:no exudate, no erythema and lips, buccal mucosa, and tongue normal  NECK: no adenopathy, thyroid normal size, non-tender, without nodularity LYMPH:  no palpable lymphadenopathy, no hepatosplenomegaly BREAST:breasts appear normal, no suspicious masses, no skin or nipple changes or axillary nodes LUNGS: clear to auscultation  HEART: regular rate & rhythm ABDOMEN:abdomen soft, non-tender, obese, normal bowel sounds and no masses or organomegaly BACK: Back symmetric, no curvature., No CVA tenderness EXTREMITIES:no edema, no clubbing, no cyanosis  NEURO: alert & oriented x 3 with fluent speech, no focal motor/sensory deficits, gait normal     STUDIES/RESULTS: US Breast Left  2013-03-29   *RADIOLOGY REPORT*  Clinical Data:  57 year old female with abnormal screening mammogram - right breast calcifications.  Also palpable left breast lump discovered on self examination.  DIGITAL DIAGNOSTIC BILATERAL MAMMOGRAM  AND LEFT BREAST ULTRASOUND:  Comparison:  02/23/2013 screening mammogram and prior mammograms dating back to 10/20/2007  Findings:  ACR Breast Density Category d:  The breast tissue is extremely dense, which lowers the sensitivity of mammography.  Magnification views of the right breast demonstrate a 2 x 5 mm group of heterogeneous calcifications within the anterior lower outer right breast.  No definite associated mass is noted. There is A circumscribed oval mass within the upper left breast is present.  On physical exam, a firm palpable mobile mass is identified at the 10 o'clock position of the left breast 4 cm from the nipple.  Ultrasound is performed, showing a 1.9 x 1.6 x 1.8 cm benign simple cyst at the 10 o'clock position of the left breast 4 cm  from the nipple, corresponding to the area of patient concern.  IMPRESSION: Indeterminate calcifications within the anterior lower outer right breast - tissue sampling is recommended to exclude DCIS.  Benign simple cyst in the upper inner left breast, corresponding to the area of palpable concern.  BI-RADS CATEGORY 4:  Suspicious abnormality - biopsy should be considered.  RECOMMENDATION: Stereotactic guided right breast biopsy, which will be scheduled by our office.  I have discussed the findings and recommendations with the patient. Results were also provided in writing at the conclusion of the visit.  If applicable, a reminder letter will be sent to the patient regarding the next appointment.   Original Report Authenticated By: Harmon Pier, M.D.   Mm Digital Diagnostic Bilat  March 29, 2013   *RADIOLOGY REPORT*  Clinical Data:  57 year old female with abnormal screening mammogram - right breast calcifications.  Also palpable left breast lump discovered on self examination.  DIGITAL DIAGNOSTIC BILATERAL MAMMOGRAM  AND LEFT BREAST ULTRASOUND:  Comparison:  02/23/2013 screening mammogram and prior mammograms dating back to 10/20/2007  Findings:  ACR Breast Density Category d:  The breast tissue is extremely dense, which lowers the sensitivity of mammography.  Magnification views of the right breast  demonstrate a 2 x 5 mm group of heterogeneous calcifications within the anterior lower outer right breast.  No definite associated mass is noted. There is A circumscribed oval mass within the upper left breast is present.  On physical exam, a firm palpable mobile mass is identified at the 10 o'clock position of the left breast 4 cm from the nipple.  Ultrasound is performed, showing a 1.9 x 1.6 x 1.8 cm benign simple cyst at the 10 o'clock position of the left breast 4 cm from the nipple, corresponding to the area of patient concern.  IMPRESSION: Indeterminate calcifications within the anterior lower outer right breast -  tissue sampling is recommended to exclude DCIS.  Benign simple cyst in the upper inner left breast, corresponding to the area of palpable concern.  BI-RADS CATEGORY 4:  Suspicious abnormality - biopsy should be considered.  RECOMMENDATION: Stereotactic guided right breast biopsy, which will be scheduled by our office.  I have discussed the findings and recommendations with the patient. Results were also provided in writing at the conclusion of the visit.  If applicable, a reminder letter will be sent to the patient regarding the next appointment.   Original Report Authenticated By: Harmon Pier, M.D.   Mm Rt Breast Bx W Loc Dev 1st Lesion Image Bx Spec Stereo Guide  04/04/2013   **ADDENDUM** CREATED: 04/04/2013 16:26:18  The patient returned on 04/04/2013 after an unsuccessful attempt at MRI due to claustrophobia. She stated that she did not feel that she would be able to undergo an MRI of the breasts with medication. She returned due to a concern about the degree of bruising in her right breast and a palpable hematoma.  She also requested removal of the stitch.  On physical examination, the patient has a large area of bruising involving the majority of the inferior half of the right breast. The right breast is also approximately 50% larger than the left breast.  She reported that the breasts are normally approximately symmetrical in size.  She also has an approximately 8 x 5 cm palpable hematoma in the inferior right breast, centered slightly laterally.  Post clip placement mammogram images of the right breast were obtained today.  These demonstrate a T-shaped biopsy marker clip approximately 2 cm superior and lateral to the location of the biopsied microcalcifications.  None of the targeted calcifications remain following biopsy.  There are residual loosely grouped tiny microcalcifications in that portion of the breast, 6 mm medial and inferior to the biopsy marker clip, measuring 1.2 cm in maximum diameter.  The  stitch was removed from the lower outer portion of the right breast.  The wound demonstrates normal healing with no extrusion of fluid.  She will be seen in the Multidisciplinary Clinic tomorrow.  **END ADDENDUM** SIGNED BY: Londell Moh. Azucena Kuba, M.D.  03/31/2013   **ADDENDUM** CREATED: 03/31/2013 11:36:47  Addendum by Dr. Christiana Pellant on 03/31/2013 at 11:36 a.m.  I spoke with the patient by telephone to discuss pathology results. Pathology demonstrates high-grade DCIS, which is concordant with the imaging appearance.  Multidisciplinary clinic is scheduled 04/05/2013.  MRI appointment will be scheduled but has not yet been confirmed.  The patient will return [facility]/06/2013 to [facility] for mammograms of the right breast and removal of the stitch.  All questions were answered.  She reports no problems at the biopsy site overnight.  **END ADDENDUM** SIGNED BY: Harrel Lemon, M.D.  03/30/2013   *RADIOLOGY REPORT*  Clinical Data:  Suspicious right breast calcifications,  lower outer quadrant  STEREOTACTIC-GUIDED VACUUM ASSISTED BIOPSY OF THE RIGHT BREAST AND SPECIMEN RADIOGRAPH  Comparison: Previous exams.  I met with the patient and we discussed the procedure of stereotactic-guided biopsy, including benefits and alternatives. We discussed the high likelihood of a successful procedure. We discussed the risks of the procedure, including infection, bleeding, tissue injury, clip migration, and inadequate sampling. Informed, written consent was given. The usual time-out protocol was performed immediately prior to the procedure.  Using sterile technique and 2% Lidocaine as local anesthetic, under stereotactic guidance, a 9 gauge vacuum-assisted device was used to perform core needle biopsy of right lower outer quadrant calcifications using a lateral to medial approach.  Specimen radiograph was performed, showing calcifications in the biopsy samples.  Specimens with calcifications are identified for pathology.  At the  conclusion of the procedure, a T shaped tissue marker clip was deployed into the biopsy cavity.  Follow-up 2-view mammogram was not performed because the patient had prolonged bleeding despite manual compression and application of hemostatic topical material.  This resolved after placement of a single stitch.  IMPRESSION: Stereotactic-guided biopsy of right lower outer quadrant calcifications with T shaped clip placement.  Pathology is pending. No apparent complications.  We will contact the patient with pathology results and she has a follow-up appointment on 04/06/2013 at 3:30 p.m. to evaluate wound healing and remove the stitch.   Original Report Authenticated By: Christiana Pellant, M.D.     LABS:    Chemistry      Component Value Date/Time   NA 142 04/05/2013 1223   K 4.1 04/05/2013 1223   CO2 28 04/05/2013 1223   BUN 22.5 04/05/2013 1223   CREATININE 0.7 04/05/2013 1223      Component Value Date/Time   CALCIUM 9.1 04/05/2013 1223   ALKPHOS 49 04/05/2013 1223   AST 13 04/05/2013 1223   ALT 9 04/05/2013 1223   BILITOT 0.56 04/05/2013 1223      Lab Results  Component Value Date   WBC 8.6 04/05/2013   HGB 13.2 04/05/2013   HCT 39.4 04/05/2013   MCV 91.6 04/05/2013   PLT 146 04/05/2013   PATHOLOGY: ADDITIONAL INFORMATION: PROGNOSTIC INDICATORS - ACIS Results: IMMUNOHISTOCHEMICAL AND MORPHOMETRIC ANALYSIS BY THE AUTOMATED CELLULAR IMAGING SYSTEM (ACIS) Estrogen Receptor: 0%, NEGATIVE Progesterone Receptor: 0%, NEGATIVE COMMENT: The negative hormone receptor study(ies) in this case have an internal positive control. REFERENCE RANGE ESTROGEN RECEPTOR NEGATIVE <1% POSITIVE =>1% PROGESTERONE RECEPTOR NEGATIVE <1% POSITIVE =>1% All controls stained appropriately Pecola Leisure MD Pathologist, Electronic Signature ( Signed 04/05/2013) FINAL DIAGNOSIS Diagnosis Breast, right, needle core biopsy, LOQ - DUCTAL CARCINOMA IN SITU WITH CALCIFICATIONS. - FIBROCYSTIC CHANGES WITH  CALCIFICATIONS. - SEE COMMENT. 1 of 2 Duplicate copy FINAL for Caitlin Mcneil, Caitlin Mcneil ([id number]) Microscopic Comment The carcinoma is high grade. Estrogen receptor and progesterone receptor studies will be performed and the results reported separately. The results were called to [facility] on 03/31/2013. 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 Patient is also found to have a simple cyst in the right side. Patient is seen in the [facility] clinic for discussion of treatment options. She is a good candidate for a lumpectomy with sentinel lymph node biopsy.  #2 she was also seen by Dr. Dorothy Puffer to discuss radiation therapy adjuvantly.  #  3 because patient's tumor is estrogen receptor negative she would not be a candidate for antiestrogen therapy. But certainly she would be eligible for a clinical study NSABP B. 43. We will discuss this when she returns to [place].  Clinical Trial Eligibility: yes Multidisciplinary conference discussion yes     PLAN:    #1 patient will proceed with lumpectomy sentinel lymph node biopsy initially by Dr. Luisa Hart.  #2 I will see her back after her surgery. She will be referred to Dr. Dorothy Puffer for adjuvant radiation therapy        Discussion: Patient is being treated per NCCN breast cancer care guidelines appropriate for stage.0   Thank you so much for allowing me to participate in the care of Caitlin Mcneil. I will continue to follow up the patient with you and assist in her care.  All questions were answered. The patient knows to call the clinic with any problems, questions or concerns. We can certainly see the patient much sooner if necessary.  I spent 55 minutes counseling the patient face to face. The total  time spent in the appointment was 60 minutes.  Drue Second, MD Medical/Oncology [facility] [phone] (beeper) [phone] (Office)  04/05/2013, 3:41 PM

## 2013-04-07 ENCOUNTER — Other Ambulatory Visit: Payer: BC Managed Care – PPO

## 2013-04-09 NOTE — Progress Notes (Signed)
Radiation Oncology         [phone] ________________________________  Name: Caitlin Mcneil MRN: [id number]  Date: 04/05/2013  DOB: 08-14-1955  MV:HQIO,N RONALD, MD  Cornett, Clovis Pu., MD     Drue Second, MD  REFERRING PHYSICIAN: Luisa Hart, Clovis Pu., MD   DIAGNOSIS: The encounter diagnosis was Breast cancer of lower-outer quadrant of right female breast.   HISTORY OF PRESENT ILLNESS::Caitlin Mcneil is a 57 y.o. female who is seen for an initial consultation visit. The patient was noted to have some suspicious calcifications within the right breast on recent screening mammogram. She states that she has obtained mammograms annually in the past. Magnification views of the right breast demonstrated a 2 x 5 mm group of calcifications within the lower outer quadrant of the right breast. A circumscribed oval mass was also present in the upper left breast which represented a benign simple cyst on ultrasound. Biopsy was performed on the suspicious mass with a stereotactic biopsy. 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 Receptor studies have indicated that the tumor is estrogen negative and progesterone negative.  An MRI scan was attempted but this had to be discontinued due to claustrophobia. The patient is not interested in proceeding with another attempt. Her case was discussed this in multidisciplinary conference this morning and she is seen today in multidisciplinary clinic.   PREVIOUS RADIATION THERAPY: No   PAST MEDICAL HISTORY:  has a past medical history of Bipolar 1 disorder; Anxiety; and Depression.     PAST SURGICAL HISTORY: Past Surgical History  Procedure Laterality Date  . Abdominal hysterectomy    . Foot surgery       FAMILY HISTORY: family history includes Colon cancer in her father.   SOCIAL HISTORY:  reports that she has never smoked. She does not have any smokeless tobacco history on file. She reports that she does not drink alcohol  or use illicit drugs.   ALLERGIES: Aspirin and Penicillins   MEDICATIONS:  Current Outpatient Prescriptions  Medication Sig Dispense Refill  . Acetaminophen (TYLENOL ARTHRITIS PAIN PO) Take 2 tablets by mouth 2 (two) times daily.      Marland Kitchen estradiol (MINIVELLE) 0.075 MG/24HR Place 1 patch onto the skin 2 (two) times a week.      . Estradiol (VAGIFEM) 10 MCG TABS vaginal tablet Place vaginally 3 (three) times a week.      . pregabalin (LYRICA) 100 MG capsule Take 100 mg by mouth 2 (two) times daily.      . temazepam (RESTORIL) 15 MG capsule Take 30 mg by mouth at bedtime as needed for sleep.      . ziprasidone (GEODON) 40 MG capsule Take 40 mg by mouth daily with supper.      . zolpidem (AMBIEN CR) 12.5 MG CR tablet Take 12.5 mg by mouth at bedtime as needed for sleep.       No current facility-administered medications for this encounter.     REVIEW OF SYSTEMS:  A 15 point review of systems is documented in the electronic medical record. This was obtained by the nursing staff. However, I reviewed this with the patient to discuss relevant findings and make appropriate changes.  Pertinent items are noted in HPI.    PHYSICAL EXAM:  vitals were not taken for this visit.  General: Well-developed, in no acute distress HEENT: Normocephalic, atraumatic; oral cavity clear Neck: Supple without any lymphadenopathy Cardiovascular: Regular rate and rhythm Respiratory: Clear to auscultation bilaterally Breasts:  A hematoma  is present within the right breast with significant bruising. No skin change or nipple change/inversion. No axillary adenopathy on the right. Benign exam on the left in terms of left breast and left axilla GI: Soft, nontender, normal bowel sounds Extremities: No edema present Neuro: No focal deficits     LABORATORY DATA:  Lab Results  Component Value Date   WBC 8.6 04/05/2013   HGB 13.2 04/05/2013   HCT 39.4 04/05/2013   MCV 91.6 04/05/2013   PLT 146 04/05/2013   Lab  Results  Component Value Date   NA 142 04/05/2013   K 4.1 04/05/2013   CO2 28 04/05/2013   Lab Results  Component Value Date   ALT 9 04/05/2013   AST 13 04/05/2013   ALKPHOS 49 04/05/2013   BILITOT 0.56 04/05/2013      RADIOGRAPHY: US Breast Left  03/15/2013   *RADIOLOGY REPORT*  Clinical Data:  57 year old female with abnormal screening mammogram - right breast calcifications.  Also palpable left breast lump discovered on self examination.  DIGITAL DIAGNOSTIC BILATERAL MAMMOGRAM  AND LEFT BREAST ULTRASOUND:  Comparison:  02/23/2013 screening mammogram and prior mammograms dating back to 10/20/2007  Findings:  ACR Breast Density Category d:  The breast tissue is extremely dense, which lowers the sensitivity of mammography.  Magnification views of the right breast demonstrate a 2 x 5 mm group of heterogeneous calcifications within the anterior lower outer right breast.  No definite associated mass is noted. There is A circumscribed oval mass within the upper left breast is present.  On physical exam, a firm palpable mobile mass is identified at the 10 o'clock position of the left breast 4 cm from the nipple.  Ultrasound is performed, showing a 1.9 x 1.6 x 1.8 cm benign simple cyst at the 10 o'clock position of the left breast 4 cm from the nipple, corresponding to the area of patient concern.  IMPRESSION: Indeterminate calcifications within the anterior lower outer right breast - tissue sampling is recommended to exclude DCIS.  Benign simple cyst in the upper inner left breast, corresponding to the area of palpable concern.  BI-RADS CATEGORY 4:  Suspicious abnormality - biopsy should be considered.  RECOMMENDATION: Stereotactic guided right breast biopsy, which will be scheduled by our office.  I have discussed the findings and recommendations with the patient. Results were also provided in writing at the conclusion of the visit.  If applicable, a reminder letter will be sent to the patient regarding the  next appointment.   Original Report Authenticated By: Harmon Pier, M.D.   Mm Digital Diagnostic Bilat  03/15/2013   *RADIOLOGY REPORT*  Clinical Data:  57 year old female with abnormal screening mammogram - right breast calcifications.  Also palpable left breast lump discovered on self examination.  DIGITAL DIAGNOSTIC BILATERAL MAMMOGRAM  AND LEFT BREAST ULTRASOUND:  Comparison:  02/23/2013 screening mammogram and prior mammograms dating back to 10/20/2007  Findings:  ACR Breast Density Category d:  The breast tissue is extremely dense, which lowers the sensitivity of mammography.  Magnification views of the right breast demonstrate a 2 x 5 mm group of heterogeneous calcifications within the anterior lower outer right breast.  No definite associated mass is noted. There is A circumscribed oval mass within the upper left breast is present.  On physical exam, a firm palpable mobile mass is identified at the 10 o'clock position of the left breast 4 cm from the nipple.  Ultrasound is performed, showing a 1.9 x 1.6 x 1.8 cm benign simple  cyst at the 10 o'clock position of the left breast 4 cm from the nipple, corresponding to the area of patient concern.  IMPRESSION: Indeterminate calcifications within the anterior lower outer right breast - tissue sampling is recommended to exclude DCIS.  Benign simple cyst in the upper inner left breast, corresponding to the area of palpable concern.  BI-RADS CATEGORY 4:  Suspicious abnormality - biopsy should be considered.  RECOMMENDATION: Stereotactic guided right breast biopsy, which will be scheduled by our office.  I have discussed the findings and recommendations with the patient. Results were also provided in writing at the conclusion of the visit.  If applicable, a reminder letter will be sent to the patient regarding the next appointment.   Original Report Authenticated By: Harmon Pier, M.D.   Mm Rt Breast Bx W Loc Dev 1st Lesion Image Bx Spec Stereo Guide  04/04/2013    **ADDENDUM** CREATED: 04/04/2013 16:26:18  The patient returned on 04/04/2013 after an unsuccessful attempt at MRI due to claustrophobia. She stated that she did not feel that she would be able to undergo an MRI of the breasts with medication. She returned due to a concern about the degree of bruising in her right breast and a palpable hematoma.  She also requested removal of the stitch.  On physical examination, the patient has a large area of bruising involving the majority of the inferior half of the right breast. The right breast is also approximately 50% larger than the left breast.  She reported that the breasts are normally approximately symmetrical in size.  She also has an approximately 8 x 5 cm palpable hematoma in the inferior right breast, centered slightly laterally.  Post clip placement mammogram images of the right breast were obtained today.  These demonstrate a T-shaped biopsy marker clip approximately 2 cm superior and lateral to the location of the biopsied microcalcifications.  None of the targeted calcifications remain following biopsy.  There are residual loosely grouped tiny microcalcifications in that portion of the breast, 6 mm medial and inferior to the biopsy marker clip, measuring 1.2 cm in maximum diameter.  The stitch was removed from the lower outer portion of the right breast.  The wound demonstrates normal healing with no extrusion of fluid.  She will be seen in the Multidisciplinary Clinic tomorrow.  **END ADDENDUM** SIGNED BY: Londell Moh. Azucena Kuba, M.D.  03/31/2013   **ADDENDUM** CREATED: 03/31/2013 11:36:47  Addendum by Dr. Christiana Pellant on 03/31/2013 at 11:36 a.m.  I spoke with the patient by telephone to discuss pathology results. Pathology demonstrates high-grade DCIS, which is concordant with the imaging appearance.  Multidisciplinary clinic is scheduled 04/05/2013.  MRI appointment will be scheduled but has not yet been confirmed.  The patient will return 04/06/2013 to the [facility] for mammograms of the right breast and removal of the stitch.  All questions were answered.  She reports no problems at the biopsy site overnight.  **END ADDENDUM** SIGNED BY: Harrel Lemon, M.D.  03/30/2013   *RADIOLOGY REPORT*  Clinical Data:  Suspicious right breast calcifications, lower outer quadrant  STEREOTACTIC-GUIDED VACUUM ASSISTED BIOPSY OF THE RIGHT BREAST AND SPECIMEN RADIOGRAPH  Comparison: Previous exams.  I met with the patient and we discussed the procedure of stereotactic-guided biopsy, including benefits and alternatives. We discussed the high likelihood of a successful procedure. We discussed the risks of the procedure, including infection, bleeding, tissue injury, clip migration, and inadequate sampling. Informed, written consent was given. The usual time-out protocol was performed immediately prior  to the procedure.  Using sterile technique and 2% Lidocaine as local anesthetic, under stereotactic guidance, a 9 gauge vacuum-assisted device was used to perform core needle biopsy of right lower outer quadrant calcifications using a lateral to medial approach.  Specimen radiograph was performed, showing calcifications in the biopsy samples.  Specimens with calcifications are identified for pathology.  At the conclusion of the procedure, a T shaped tissue marker clip was deployed into the biopsy cavity.  Follow-up 2-view mammogram was not performed because the patient had prolonged bleeding despite manual compression and application of hemostatic topical material.  This resolved after placement of a single stitch.  IMPRESSION: Stereotactic-guided biopsy of right lower outer quadrant calcifications with T shaped clip placement.  Pathology is pending. No apparent complications.  We will contact the patient with pathology results and she has a follow-up appointment on 04/06/2013 at 3:30 p.m. to evaluate wound healing and remove the stitch.   Original Report Authenticated By: Christiana Pellant,  M.D.       IMPRESSION: The patient has a new diagnosis of DCIS of the right breast. This represented a small subcentimeter area on mammogram. The patient has been seen in multidisciplinary breast clinic today and is felt to be a good candidate for breast conservation treatment. She is seeing Dr. Lavenia Atlas today who has discussed proceeding with a lumpectomy with sentinel lymph node biopsy. Next  I have discussed the potential benefit of adjuvant radiotherapy for her setting in conjunction with a lumpectomy. I would anticipate a 6-1/2 week course of treatment. We discussed the possible side effects and risks of treatment and all of her questions were answered. She does wish to proceed with this treatment plan.   PLAN: The patient will proceed with a lumpectomy in the near future with Dr. Lavenia Atlas. I look forward to seeing her postoperatively to evaluate her at that time in terms of her recovery from surgery and 2 further discuss and coordinate an anticipated course of adjuvant radiotherapy.    I spent 60  minutes face to face with the patient and more than 50% of that time was spent in counseling and/or coordination of care.    ________________________________   Radene Gunning, MD, PhD

## 2013-04-18 ENCOUNTER — Telehealth: Payer: Self-pay | Admitting: *Deleted

## 2013-04-18 ENCOUNTER — Other Ambulatory Visit (INDEPENDENT_AMBULATORY_CARE_PROVIDER_SITE_OTHER): Payer: Self-pay

## 2013-04-18 DIAGNOSIS — D0511 Intraductal carcinoma in situ of right breast: Secondary | ICD-10-CM

## 2013-04-18 NOTE — Telephone Encounter (Signed)
Spoke to pt concerning [facility].  Pt had some questions pertaining to when pre-op testing would occur and if she would speak to the anesthesiologist.  Informed pt she would have testing based on the anesthesiologists recommendations.  Discussed when radiation would start approximately 4-6 weeks after surgery.  Pt denies questions regarding dx or treatment care plan.  Encourage pt to call with further needs or concerns.  Received verbal understanding.  Contact information given.

## 2013-04-25 ENCOUNTER — Encounter (HOSPITAL_BASED_OUTPATIENT_CLINIC_OR_DEPARTMENT_OTHER): Payer: Self-pay | Admitting: *Deleted

## 2013-04-25 NOTE — Progress Notes (Signed)
No labs needed-very anxious-got started on klonopin

## 2013-04-27 ENCOUNTER — Encounter: Payer: Self-pay | Admitting: *Deleted

## 2013-04-27 NOTE — Progress Notes (Addendum)
[facility] Psychosocial Distress Screening Clinical Social Work  Clinical Social Work was referred by distress screening protocol.  The patient scored a 7 on the Psychosocial Distress Thermometer which indicates moderate distress. Clinical Social Worker phoned Pt to assess for distress and other psychosocial needs. CSW left vm for Pt and awaits return phone call.    Clinical Social Worker follow up needed: yes  If yes, follow up plan: CSW will attempt to see at subsequent appointments.  CSW received return phone call. Pt anxious about radiation as she had difficulties getting her MRI. She reports to now have anti-anxiety meds to assist her. CSW attempted to problem solve with Pt and discussed ways she could de-stress prior to radiation. Pt plans to bring a picture of her granddaughter. CSW reviewed [facility] resources to assist. Pt also had concerns that she would HAVE to continue working per her MD. Pt was able to take FMLA through her work and does not feel she can to tx and work currently. Her work is fine with her decision.  Pt agrees to call us as needed and was very appreciative of support today.   Caitlin Salvage, LCSW Clinical Social Worker [facility] Wednesday, Thursday and Friday Phone: [phone] Fax: [phone]

## 2013-05-02 ENCOUNTER — Encounter (HOSPITAL_BASED_OUTPATIENT_CLINIC_OR_DEPARTMENT_OTHER): Payer: Self-pay | Admitting: Anesthesiology

## 2013-05-02 ENCOUNTER — Ambulatory Visit
Admission: RE | Admit: 2013-05-02 | Discharge: 2013-05-02 | Disposition: A | Discharge status: Home / Self Care | Payer: BC Managed Care – PPO | Source: Ambulatory Visit | Attending: Surgery | Admitting: Surgery

## 2013-05-02 ENCOUNTER — Encounter (HOSPITAL_BASED_OUTPATIENT_CLINIC_OR_DEPARTMENT_OTHER): Payer: Self-pay | Admitting: *Deleted

## 2013-05-02 ENCOUNTER — Ambulatory Visit (HOSPITAL_BASED_OUTPATIENT_CLINIC_OR_DEPARTMENT_OTHER)
Admission: RE | Admit: 2013-05-02 | Discharge: 2013-05-02 | Disposition: A | Discharge status: Home / Self Care | Payer: BC Managed Care – PPO | Source: Ambulatory Visit | Attending: Surgery | Admitting: Surgery

## 2013-05-02 ENCOUNTER — Ambulatory Visit (HOSPITAL_BASED_OUTPATIENT_CLINIC_OR_DEPARTMENT_OTHER): Payer: BC Managed Care – PPO | Admitting: Anesthesiology

## 2013-05-02 ENCOUNTER — Encounter (HOSPITAL_BASED_OUTPATIENT_CLINIC_OR_DEPARTMENT_OTHER)
Admission: RE | Disposition: A | Discharge status: Home / Self Care | Payer: Self-pay | Source: Ambulatory Visit | Attending: Surgery

## 2013-05-02 ENCOUNTER — Encounter (HOSPITAL_COMMUNITY)
Admission: RE | Admit: 2013-05-02 | Discharge: 2013-05-02 | Disposition: A | Discharge status: Home / Self Care | Payer: BC Managed Care – PPO | Source: Ambulatory Visit | Attending: Surgery | Admitting: Surgery

## 2013-05-02 DIAGNOSIS — D0511 Intraductal carcinoma in situ of right breast: Secondary | ICD-10-CM

## 2013-05-02 DIAGNOSIS — D0590 Unspecified type of carcinoma in situ of unspecified breast: Secondary | ICD-10-CM | POA: Insufficient documentation

## 2013-05-02 DIAGNOSIS — F3130 Bipolar disorder, current episode depressed, mild or moderate severity, unspecified: Secondary | ICD-10-CM | POA: Insufficient documentation

## 2013-05-02 DIAGNOSIS — F411 Generalized anxiety disorder: Secondary | ICD-10-CM | POA: Insufficient documentation

## 2013-05-02 DIAGNOSIS — Z01818 Encounter for other preprocedural examination: Secondary | ICD-10-CM | POA: Insufficient documentation

## 2013-05-02 HISTORY — DX: Encounter for fitting and adjustment of spectacles and contact lenses: Z46.0

## 2013-05-02 HISTORY — PX: BREAST LUMPECTOMY: SHX2

## 2013-05-02 HISTORY — PX: BREAST LUMPECTOMY WITH NEEDLE LOCALIZATION AND AXILLARY SENTINEL LYMPH NODE BX: SHX5760

## 2013-05-02 SURGERY — BREAST LUMPECTOMY WITH NEEDLE LOCALIZATION AND AXILLARY SENTINEL LYMPH NODE BX
Anesthesia: General | Site: Breast | Laterality: Right | Wound class: Clean

## 2013-05-02 MED ORDER — OXYCODONE HCL 5 MG/5ML PO SOLN: 5.0000 mg | Freq: Once | ORAL | Status: DC | PRN

## 2013-05-02 MED ORDER — LACTATED RINGERS IV SOLN
INTRAVENOUS | Status: DC
  Administered 2013-05-02 (×2): via INTRAVENOUS

## 2013-05-02 MED ORDER — VANCOMYCIN HCL IN DEXTROSE 1-5 GM/200ML-% IV SOLN
1000.0000 mg | INTRAVENOUS | Status: AC
  Administered 2013-05-02 (×2): 1000 mg via INTRAVENOUS

## 2013-05-02 MED ORDER — LIDOCAINE HCL (CARDIAC) 20 MG/ML IV SOLN
INTRAVENOUS | Status: DC | PRN
  Administered 2013-05-02: 60 mg via INTRAVENOUS

## 2013-05-02 MED ORDER — BUPIVACAINE-EPINEPHRINE 0.25% -1:200000 IJ SOLN
INTRAMUSCULAR | Status: DC | PRN
  Administered 2013-05-02: 10 mL

## 2013-05-02 MED ORDER — CHLORHEXIDINE GLUCONATE 4 % EX LIQD: 1.0000 "application " | Freq: Once | CUTANEOUS | Status: DC

## 2013-05-02 MED ORDER — SCOPOLAMINE 1 MG/3DAYS TD PT72
1.0000 | MEDICATED_PATCH | TRANSDERMAL | Status: DC
  Administered 2013-05-02: 1.5 mg via TRANSDERMAL

## 2013-05-02 MED ORDER — DEXAMETHASONE SODIUM PHOSPHATE 4 MG/ML IJ SOLN
INTRAMUSCULAR | Status: DC | PRN
  Administered 2013-05-02: 10 mg via INTRAVENOUS

## 2013-05-02 MED ORDER — MIDAZOLAM HCL 5 MG/5ML IJ SOLN
INTRAMUSCULAR | Status: DC | PRN
  Administered 2013-05-02: 1 mg via INTRAVENOUS

## 2013-05-02 MED ORDER — TECHNETIUM TC 99M SULFUR COLLOID FILTERED
1.0000 | Freq: Once | INTRAVENOUS | Status: AC | PRN
  Administered 2013-05-02: 1 via INTRADERMAL

## 2013-05-02 MED ORDER — EPHEDRINE SULFATE 50 MG/ML IJ SOLN
INTRAMUSCULAR | Status: DC | PRN
  Administered 2013-05-02 (×2): 10 mg via INTRAVENOUS

## 2013-05-02 MED ORDER — MIDAZOLAM HCL 2 MG/2ML IJ SOLN
1.0000 mg | INTRAMUSCULAR | Status: DC | PRN
  Administered 2013-05-02: 1 mg via INTRAVENOUS

## 2013-05-02 MED ORDER — FENTANYL CITRATE 0.05 MG/ML IJ SOLN
50.0000 ug | INTRAMUSCULAR | Status: DC | PRN
  Administered 2013-05-02: 50 ug via INTRAVENOUS

## 2013-05-02 MED ORDER — OXYCODONE HCL 5 MG PO TABS: 5.0000 mg | ORAL_TABLET | Freq: Once | ORAL | Status: DC | PRN

## 2013-05-02 MED ORDER — HYDROMORPHONE HCL PF 1 MG/ML IJ SOLN
0.2500 mg | INTRAMUSCULAR | Status: DC | PRN
  Administered 2013-05-02 (×2): 0.5 mg via INTRAVENOUS

## 2013-05-02 MED ORDER — PROPOFOL 10 MG/ML IV BOLUS
INTRAVENOUS | Status: DC | PRN
  Administered 2013-05-02: 200 mg via INTRAVENOUS

## 2013-05-02 MED ORDER — ONDANSETRON HCL 4 MG/2ML IJ SOLN
INTRAMUSCULAR | Status: DC | PRN
  Administered 2013-05-02: 4 mg via INTRAMUSCULAR

## 2013-05-02 MED ORDER — FENTANYL CITRATE 0.05 MG/ML IJ SOLN
INTRAMUSCULAR | Status: DC | PRN
  Administered 2013-05-02: 50 ug via INTRAVENOUS
  Administered 2013-05-02: 25 ug via INTRAVENOUS

## 2013-05-02 MED ORDER — OXYCODONE-ACETAMINOPHEN 5-325 MG PO TABS: 1.0000 | ORAL_TABLET | ORAL | Status: DC | PRN

## 2013-05-02 MED ORDER — METOCLOPRAMIDE HCL 5 MG/ML IJ SOLN: 10.0000 mg | Freq: Once | INTRAMUSCULAR | Status: DC | PRN

## 2013-05-02 SURGICAL SUPPLY — 60 items
ADH SKN CLS APL DERMABOND .7 (GAUZE/BANDAGES/DRESSINGS) ×2
APPLIER CLIP 9.375 MED OPEN (MISCELLANEOUS)
APR CLP MED 9.3 20 MLT OPN (MISCELLANEOUS)
BINDER BREAST LRG (GAUZE/BANDAGES/DRESSINGS) IMPLANT
BINDER BREAST MEDIUM (GAUZE/BANDAGES/DRESSINGS) IMPLANT
BINDER BREAST XLRG (GAUZE/BANDAGES/DRESSINGS) IMPLANT
BINDER BREAST XXLRG (GAUZE/BANDAGES/DRESSINGS) IMPLANT
BLADE SURG 15 STRL LF DISP TIS (BLADE) ×1 IMPLANT
BLADE SURG 15 STRL SS (BLADE) ×1
BLADE SURG ROTATE 9660 (MISCELLANEOUS) IMPLANT
CANISTER SUCTION 1200CC (MISCELLANEOUS) ×2 IMPLANT
CHLORAPREP W/TINT 26ML (MISCELLANEOUS) ×2 IMPLANT
CLIP APPLIE 9.375 MED OPEN (MISCELLANEOUS) IMPLANT
CLOTH BEACON ORANGE TIMEOUT ST (SAFETY) ×2 IMPLANT
COVER MAYO STAND STRL (DRAPES) ×2 IMPLANT
COVER PROBE W GEL 5X96 (DRAPES) ×2 IMPLANT
COVER TABLE BACK 60X90 (DRAPES) ×2 IMPLANT
DECANTER SPIKE VIAL GLASS SM (MISCELLANEOUS) IMPLANT
DERMABOND ADVANCED (GAUZE/BANDAGES/DRESSINGS) ×2
DERMABOND ADVANCED .7 DNX12 (GAUZE/BANDAGES/DRESSINGS) ×2 IMPLANT
DEVICE DUBIN W/COMP PLATE 8390 (MISCELLANEOUS) IMPLANT
DRAIN CHANNEL 19F RND (DRAIN) IMPLANT
DRAIN HEMOVAC 1/8 X 5 (WOUND CARE) IMPLANT
DRAPE LAPAROSCOPIC ABDOMINAL (DRAPES) ×2 IMPLANT
DRAPE UTILITY XL STRL (DRAPES) ×2 IMPLANT
ELECT COATED BLADE 2.86 ST (ELECTRODE) ×2 IMPLANT
ELECT REM PT RETURN 9FT ADLT (ELECTROSURGICAL) ×2
ELECTRODE REM PT RTRN 9FT ADLT (ELECTROSURGICAL) ×1 IMPLANT
EVACUATOR SILICONE 100CC (DRAIN) IMPLANT
GAUZE SPONGE 4X4 12PLY STRL LF (GAUZE/BANDAGES/DRESSINGS) IMPLANT
GLOVE BIOGEL PI IND STRL 7.0 (GLOVE) ×2 IMPLANT
GLOVE BIOGEL PI IND STRL 8 (GLOVE) ×1 IMPLANT
GLOVE BIOGEL PI INDICATOR 7.0 (GLOVE) ×2
GLOVE BIOGEL PI INDICATOR 8 (GLOVE) ×1
GLOVE ECLIPSE 6.5 STRL STRAW (GLOVE) ×2 IMPLANT
GLOVE ECLIPSE 8.0 STRL XLNG CF (GLOVE) ×2 IMPLANT
GOWN PREVENTION PLUS XLARGE (GOWN DISPOSABLE) ×2 IMPLANT
HEMOSTAT SURGICEL 2X14 (HEMOSTASIS) IMPLANT
KIT MARKER MARGIN INK (KITS) ×2 IMPLANT
NDL SAFETY ECLIPSE 18X1.5 (NEEDLE) ×1 IMPLANT
NEEDLE HYPO 18GX1.5 SHARP (NEEDLE) ×1
NEEDLE HYPO 25X1 1.5 SAFETY (NEEDLE) ×4 IMPLANT
NS IRRIG 1000ML POUR BTL (IV SOLUTION) ×2 IMPLANT
PACK BASIN DAY SURGERY FS (CUSTOM PROCEDURE TRAY) ×2 IMPLANT
PENCIL BUTTON HOLSTER BLD 10FT (ELECTRODE) ×2 IMPLANT
PIN SAFETY STERILE (MISCELLANEOUS) IMPLANT
SLEEVE SCD COMPRESS KNEE MED (MISCELLANEOUS) ×2 IMPLANT
SPONGE LAP 18X18 X RAY DECT (DISPOSABLE) IMPLANT
SPONGE LAP 4X18 X RAY DECT (DISPOSABLE) ×2 IMPLANT
STAPLER VISISTAT 35W (STAPLE) IMPLANT
SUT ETHILON 3 0 PS 1 (SUTURE) IMPLANT
SUT MNCRL AB 3-0 PS2 18 (SUTURE) ×4 IMPLANT
SUT SILK 2 0 SH (SUTURE) IMPLANT
SUT VICRYL 3-0 CR8 SH (SUTURE) ×2 IMPLANT
SYR BULB 3OZ (MISCELLANEOUS) ×2 IMPLANT
SYR CONTROL 10ML LL (SYRINGE) ×4 IMPLANT
TOWEL OR 17X24 6PK STRL BLUE (TOWEL DISPOSABLE) ×2 IMPLANT
TOWEL OR NON WOVEN STRL DISP B (DISPOSABLE) ×2 IMPLANT
TUBE CONNECTING 20X1/4 (TUBING) ×2 IMPLANT
YANKAUER SUCT BULB TIP NO VENT (SUCTIONS) ×2 IMPLANT

## 2013-05-02 NOTE — Transfer of Care (Signed)
Immediate Anesthesia Transfer of Care Note  Patient: Caitlin Mcneil  Procedure(s) Performed: Procedure(s): BREAST LUMPECTOMY WITH NEEDLE LOCALIZATION AND AXILLARY SENTINEL LYMPH NODE BX (Right)  Patient Location: PACU  Anesthesia Type:General  Level of Consciousness: awake and patient cooperative  Airway & Oxygen Therapy: Patient Spontanous Breathing and Patient connected to face mask oxygen  Post-op Assessment: Report given to PACU RN and Post -op Vital signs reviewed and stable  Post vital signs: Reviewed and stable  Complications: No apparent anesthesia complications

## 2013-05-02 NOTE — H&P (View-Only) (Signed)
Patient ID: Caitlin Mcneil, female   DOB: [birth year], 57 y.o.   MRN: [id number]  No chief complaint on file.   HPI Caitlin Mcneil is a 57 y.o. female.  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  No other symptoms. HPI  Past Medical History  Diagnosis Date  . Bipolar 1 disorder   . Anxiety   . Depression     Past Surgical History  Procedure Laterality Date  . Abdominal hysterectomy    . Foot surgery      Family History  Problem Relation Age of Onset  . Colon cancer Father     Social History History  Substance Use Topics  . Smoking status: Never Smoker   . Smokeless tobacco: Not on file  . Alcohol Use: No    Allergies  Allergen Reactions  . Aspirin   . Penicillins     Current Outpatient Prescriptions  Medication Sig Dispense Refill  . Acetaminophen (TYLENOL ARTHRITIS PAIN PO) Take 2 tablets by mouth 2 (two) times daily.      Marland Kitchen estradiol (MINIVELLE) 0.075 MG/24HR Place 1 patch onto the skin 2 (two) times a week.      . Estradiol (VAGIFEM) 10 MCG TABS vaginal tablet Place vaginally 3 (three) times a week.      . pregabalin (LYRICA) 100 MG capsule Take 100 mg by mouth 2 (two) times daily.      . temazepam (RESTORIL) 15 MG capsule Take 30 mg by mouth at bedtime as needed for sleep.      . ziprasidone (GEODON) 40 MG capsule Take 40 mg by mouth daily with supper.      . zolpidem (AMBIEN CR) 12.5 MG CR tablet Take 12.5 mg by mouth at bedtime as needed for sleep.       No current facility-administered medications for this visit.    Review of Systems Review of Systems  Constitutional: Negative for fever, chills and unexpected weight change.  HENT: Negative for hearing loss, congestion, sore throat, trouble swallowing and voice change.   Eyes: Negative for visual disturbance.  Respiratory: Negative for cough and wheezing.   Cardiovascular: Negative for chest pain, palpitations and leg swelling.  Gastrointestinal: Negative for  nausea, vomiting, abdominal pain, diarrhea, constipation, blood in stool, abdominal distention and anal bleeding.  Genitourinary: Negative for hematuria, vaginal bleeding and difficulty urinating.  Musculoskeletal: Negative for arthralgias.  Skin: Negative for rash and wound.  Neurological: Negative for seizures, syncope and headaches.  Hematological: Negative for adenopathy. Does not bruise/bleed easily.  Psychiatric/Behavioral: Negative for confusion.    There were no vitals taken for this visit.  Physical Exam Physical Exam  Constitutional: She is oriented to person, place, and time. She appears well-developed and well-nourished.  HENT:  Head: Normocephalic and atraumatic.  Eyes: EOM are normal. Pupils are equal, round, and reactive to light.  Neck: Normal range of motion. Neck supple.  Cardiovascular: Normal rate and regular rhythm.   Pulmonary/Chest: Effort normal and breath sounds normal. Right breast exhibits no inverted nipple, no mass, no nipple discharge, no skin change and no tenderness. Left breast exhibits no inverted nipple, no mass, no nipple discharge, no skin change and no tenderness. Breasts are symmetrical.    Musculoskeletal: Normal range of motion.  Lymphadenopathy:    She has no cervical adenopathy.  Neurological: She is alert and oriented to person, place, and time.  Skin: Skin is warm and dry.  Psychiatric: She has a normal  mood and affect. Her behavior is normal. Judgment and thought content normal.    Data Reviewed Clinical Data: 57 year old female with abnormal screening  mammogram - right breast calcifications. Also palpable left breast  lump discovered on self examination.  DIGITAL DIAGNOSTIC BILATERAL MAMMOGRAM AND LEFT BREAST ULTRASOUND:  Comparison: 02/23/2013 screening mammogram and prior mammograms  dating back to 10/20/2007  Findings:  ACR Breast Density Category d: The breast tissue is extremely  dense, which lowers the sensitivity of  mammography.  Magnification views of the right breast demonstrate a 2 x 5 mm  group of heterogeneous calcifications within the anterior lower  outer right breast. No definite associated mass is noted. There is  A circumscribed oval mass within the upper left breast is present.  On physical exam, a firm palpable mobile mass is identified at the  10 o'clock position of the left breast 4 cm from the nipple.  Ultrasound is performed, showing a 1.9 x 1.6 x 1.8 cm benign simple  cyst at the 10 o'clock position of the left breast 4 cm from the  nipple, corresponding to the area of patient concern.  IMPRESSION:  Indeterminate calcifications within the anterior lower outer right  breast - tissue sampling is recommended to exclude DCIS.  Benign simple cyst in the upper inner left breast, corresponding to  the area of palpable concern.  BI-RADS CATEGORY 4: Suspicious abnormality - biopsy should be  considered.  RECOMMENDATION:  Stereotactic guided right breast biopsy, which will be scheduled by  our office.  I have discussed the findings and recommendations with the patient.  Results were also provided in writing at the conclusion of the  visit. If applicable, a reminder letter will be sent to the  patient regarding the next appointment.  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  discueed other options and reconstruction.The procedure has been discussed with the patient. Alternatives to surgery have been discussed with the patient.  Risks of surgery include bleeding,  Infection,  Seroma formation, death,  and the need for further surgery.   The patient understands and wishes to proceed.Sentinel lymph node mapping and dissection has been discussed with the patient.  Risk of bleeding,  Infection,  Seroma formation,   Additional procedures,,  Shoulder weakness ,  Shoulder stiffness,  Nerve and blood vessel injury and reaction to the mapping dyes have been discussed.  Alternatives to surgery have been discussed with the patient.  The patient agrees to proceed.       Ethyn Schetter A. 04/05/2013, 3:16 PM

## 2013-05-02 NOTE — Anesthesia Preprocedure Evaluation (Signed)
Anesthesia Evaluation  Patient identified by MRN, date of birth, ID band Patient awake    Reviewed: Allergy & Precautions, H&P , NPO status , Patient's Chart, lab work & pertinent test results, reviewed documented beta blocker date and time   Airway Mallampati: II TM Distance: >3 FB Neck ROM: full    Dental   Pulmonary neg pulmonary ROS,  breath sounds clear to auscultation        Cardiovascular negative cardio ROS  Rhythm:regular     Neuro/Psych PSYCHIATRIC DISORDERS Anxiety Depression Bipolar Disorder negative neurological ROS     GI/Hepatic negative GI ROS, Neg liver ROS,   Endo/Other  negative endocrine ROS  Renal/GU negative Renal ROS  negative genitourinary   Musculoskeletal   Abdominal   Peds  Hematology negative hematology ROS (+)   Anesthesia Other Findings See surgeon's H&P   Reproductive/Obstetrics negative OB ROS                           Anesthesia Physical Anesthesia Plan  ASA: II  Anesthesia Plan: General   Post-op Pain Management:    Induction: Intravenous  Airway Management Planned: LMA  Additional Equipment:   Intra-op Plan:   Post-operative Plan:   Informed Consent: I have reviewed the patients History and Physical, chart, labs and discussed the procedure including the risks, benefits and alternatives for the proposed anesthesia with the patient or authorized representative who has indicated his/her understanding and acceptance.   Dental Advisory Given  Plan Discussed with: CRNA and Surgeon  Anesthesia Plan Comments:         Anesthesia Quick Evaluation

## 2013-05-02 NOTE — Anesthesia Postprocedure Evaluation (Signed)
Anesthesia Post Note  Patient: Caitlin Mcneil  Procedure(s) Performed: Procedure(s) (LRB): BREAST LUMPECTOMY WITH NEEDLE LOCALIZATION AND AXILLARY SENTINEL LYMPH NODE BX (Right)  Anesthesia type: General  Patient location: PACU  Post pain: Pain level controlled  Post assessment: Patient's Cardiovascular Status Stable  Last Vitals:  Filed Vitals:   05/02/13 1232  BP: 132/80  Pulse: 73  Temp: 37 C  Resp: 16    Post vital signs: Reviewed and stable  Level of consciousness: alert  Complications: No apparent anesthesia complications

## 2013-05-02 NOTE — Brief Op Note (Signed)
05/02/2013  10:58 AM  PATIENT:  Caitlin Mcneil  57 y.o. female  PRE-OPERATIVE DIAGNOSIS:  DCIS   POST-OPERATIVE DIAGNOSIS:  ductal carcinoma insitu right  PROCEDURE:  Procedure(s): BREAST LUMPECTOMY WITH NEEDLE LOCALIZATION AND AXILLARY SENTINEL LYMPH NODE BX (Right)  SURGEON:  Surgeon(s) and Role:    * Aodhan Scheidt A. Zymiere Trostle, MD - Primary    ASSISTANTS: none   ANESTHESIA:   local and general  EBL:  Total I/O In: 1400 [I.V.:1400] Out: -   BLOOD ADMINISTERED:none  DRAINS: none   LOCAL MEDICATIONS USED:  BUPIVICAINE   SPECIMEN:  Source of Specimen:  right breast and axilla  DISPOSITION OF SPECIMEN:  PATHOLOGY  COUNTS:  YES  TOURNIQUET:  * No tourniquets in log *  DICTATION: .Other Dictation: Dictation Number [id number]  PLAN OF CARE: Discharge to home after PACU  PATIENT DISPOSITION:  PACU - hemodynamically stable.   Delay start of Pharmacological VTE agent (>24hrs) due to surgical blood loss or risk of bleeding: not applicable

## 2013-05-02 NOTE — Progress Notes (Signed)
Emotional support during breast injections °

## 2013-05-02 NOTE — Interval H&P Note (Signed)
History and Physical Interval Note:  05/02/2013 9:08 AM  Caitlin Mcneil  has presented today for surgery, with the diagnosis of DCIS   The various methods of treatment have been discussed with the patient and family. After consideration of risks, benefits and other options for treatment, the patient has consented to  Procedure(s): BREAST LUMPECTOMY WITH NEEDLE LOCALIZATION AND AXILLARY SENTINEL LYMPH NODE BX (Right) as a surgical intervention .  The patient's history has been reviewed, patient examined, no change in status, stable for surgery.  I have reviewed the patient's chart and labs.  Questions were answered to the patient's satisfaction.     Sanchez Hemmer A.

## 2013-05-03 ENCOUNTER — Encounter (HOSPITAL_BASED_OUTPATIENT_CLINIC_OR_DEPARTMENT_OTHER): Payer: Self-pay | Admitting: Surgery

## 2013-05-03 LAB — POCT HEMOGLOBIN-HEMACUE: Hemoglobin: 14.3 g/dL (ref 12.0–15.0)

## 2013-05-03 NOTE — Op Note (Signed)
Caitlin Mcneil, Caitlin Mcneil               ACCOUNT NO.:  [id number]  MEDICAL RECORD NO.:  [id number]  LOCATION:  [facility]                          FACILITY:  [facility]  PHYSICIAN:  [name] A. Marquavis Hannen, M.D.DATE OF BIRTH:  [birth year]  DATE OF PROCEDURE:  05/02/2013 DATE OF DISCHARGE:                              OPERATIVE REPORT   PREOPERATIVE DIAGNOSIS:  Right breast ductal carcinoma in situ, high grade.  POSTOPERATIVE DIAGNOSIS:  Right breast ductal carcinoma in situ, high grade.  PROCEDURE: 1. Right breast needle localized lumpectomy. 2. Right axillary sentinel lymph node mapping.  SURGEON:  [name] A. Otilia Kareem, MD  ANESTHESIA:  LMA with 0.25% bupivacaine with epinephrine 20 mL used.  EBL:  Minimal.  SPECIMEN: 1. Right breast mass. 2. Two right axillary sentinel nodes to pathology.  DRAINS:  None.  EBL:  Minimal.  IV FLUIDS:  Approximately 800 mL.  INDICATIONS FOR PROCEDURE:  The patient is a 57 year old female found to have a 1.2 cm focus on her right breast ductal carcinoma in situ.  This was high grade.  We discussed options of breast conservation versus mastectomy.  We discussed the utility of lymph node mapping with circumstance.  Given that she was high grade DCIS I recommended doing a sentinel lymph node at the same time.  Risk, benefits, and alternative therapies were discussed in this portion of the consultation.  Risk of bleeding, infection, nerve injury, numbness, deformity of the breast, seroma formation, arm stiffness, right arm swelling, decreased range of motion requiring physical therapy were all discussed preoperatively. She understood all of the above and agreed to proceed.  DESCRIPTION OF PROCEDURE:  The patient was met in the holding area. Questions were answered.  She underwent wire localization at the [facility] [place], and subsequent injection of technetium sulfur colloid in the holding area under sedation.  She was  then taken back to the operating  room after the correct site was marked.  She was placed supine and general anesthesia was initiated.  A time-out was done.  The right breast was then prepped and draped in sterile fashion.  Neoprobe was used and a hot spot was identified in the right axilla.  A 2 cm incision was made in the right axilla and dissection was carried down and 2 hot nodes were identified.  Backgrounds were approached after that.  Irrigation was used.  These were sent to pathology for evaluation.  Lumpectomy was then done.  Wire into the breast just under the nipple under 6 o'clock position.  Curvilinear incision was made along the border of the nipple.  All tissue around the wires was excised and sent to pathology in the lower central quadrant.  Irrigation was used.  There was an old hematoma there.  Radiograph revealed the clip and wire to be in the specimen with the calcifications.  Clips were placed.  This was then irrigated and found to be hemostatic.  It was closed with 3-0 Vicryl and 4-0 Monocryl.  The axilla was examined and found to be hemostatic.  Surgicel was placed into the cavity.  It was then closed in the deep layer with 3-0 Vicryl and 4-0 Monocryl stitch. Dermabond applied.  Breast binder  applied.  All final counts of sponge, needle, and instruments were found to be correct at this portion of the case.  A 20 mL of local anesthesia was used with both incisions.  The patient was then extubated and taken to recovery in satisfactory condition.     Alin Hutchins A. Aubrianne Molyneux, M.D.     TAC/MEDQ  D:  05/02/2013  T:  05/03/2013  Job:  [id number]

## 2013-05-04 ENCOUNTER — Encounter (INDEPENDENT_AMBULATORY_CARE_PROVIDER_SITE_OTHER): Payer: Self-pay | Admitting: Surgery

## 2013-05-08 ENCOUNTER — Encounter: Payer: Self-pay | Admitting: *Deleted

## 2013-05-08 NOTE — Progress Notes (Signed)
Mailed after appt letter to pt. 

## 2013-05-11 ENCOUNTER — Encounter: Payer: Self-pay | Admitting: *Deleted

## 2013-05-11 NOTE — Progress Notes (Unsigned)
Location of Breast Cancer:Right lower outer quadrant   Histology per Pathology Report: 03/30/13:DiagnosisBreast, right, needle core biopsy, LOQ- DUCTAL CARCINOMA IN SITU WITH CALCIFICATIONS.- FIBROCYSTIC CHANGES WITH CALCIFICATIONS  Receptor Status: ER(-), PR (-), Her2-neu (- )  Did patient present with symptoms (if so, please note symptoms) or was this found on screening mammography?:screening  Past/Anticipated interventions by surgeon, if any:  05/02/13:Diagnosis1. Lymph node, sentinel, biopsy, Right axillary, #1- THERE IS NO EVIDENCE OF CARCINOMA IN 1 OF 1 LYMPH NODE. (0/1).- SEE COMMENT. 2. Lymph node, sentinel, biopsy, Right axillary, #2- THERE IS NO EVIDENCE OF CARCINOMA IN 1 OF 1 LYMPH NODE. (0/1).- SEE COMMENT.3. Lymph node, sentinel, biopsy, Right axillary, #3- THERE IS NO EVIDENCE OF CARCINOMA IN 1 OF 1 LYMPH NODE (0/1).- SEE COMMENT.4. Breast, lumpectomy, Right- INVASIVE DUCTAL CARCINOMA, GRADE I/III, SPANNING AT LEAST 0.35 CM.- DUCTAL CARCINOMA IN SITU, HIGH GRADE. - THE SURGICAL RESECTION MARGINS ARE NEGATIVE FOR CARCINOMA.Dr.Thomas Cornett  Post=op check appt 05/15/13   Past/Anticipated interventions by medical oncology, if any: Chemotherapy seen [facility] 04/05/13 Dr.khan, clinical trial NSABP B.43, follow up Dr.Khan 06/12/13  Lymphedema issues, if any:  no  Pain issues, if AVW:UJWJ, soreness slight  SAFETY ISSUES:  Prior radiation? No  Pacemaker/ICD? no  Possible current pregnancy?no  Is the patient on methotrexate? no  Current Complaints / other details: seen in [facility] 04/05/13:   Hx, Bi-polar disorder, anxiety/depression  Claustrophobia,  Father colon cancer,non smoker Tieasha Larsen, Gloriann Loan, RN 05/11/2013,3:25 PM

## 2013-05-15 ENCOUNTER — Ambulatory Visit
Admission: RE | Admit: 2013-05-15 | Discharge: 2013-05-15 | Disposition: A | Discharge status: Home / Self Care | Payer: BC Managed Care – PPO | Source: Ambulatory Visit | Attending: Radiation Oncology | Admitting: Radiation Oncology

## 2013-05-15 ENCOUNTER — Encounter: Payer: Self-pay | Admitting: Radiation Oncology

## 2013-05-15 ENCOUNTER — Encounter (INDEPENDENT_AMBULATORY_CARE_PROVIDER_SITE_OTHER): Payer: Self-pay | Admitting: Surgery

## 2013-05-15 ENCOUNTER — Ambulatory Visit (INDEPENDENT_AMBULATORY_CARE_PROVIDER_SITE_OTHER): Payer: BC Managed Care – PPO | Admitting: Surgery

## 2013-05-15 VITALS — BP 123/83 | HR 76 | Temp 98.3°F | Resp 20 | Ht 63.25 in | Wt 172.0 lb

## 2013-05-15 VITALS — BP 110/80 | HR 76 | Temp 97.0°F | Resp 14 | Ht 63.25 in | Wt 170.2 lb

## 2013-05-15 DIAGNOSIS — Z79899 Other long term (current) drug therapy: Secondary | ICD-10-CM | POA: Insufficient documentation

## 2013-05-15 DIAGNOSIS — C50511 Malignant neoplasm of lower-outer quadrant of right female breast: Secondary | ICD-10-CM

## 2013-05-15 DIAGNOSIS — Z9889 Other specified postprocedural states: Secondary | ICD-10-CM

## 2013-05-15 DIAGNOSIS — Z171 Estrogen receptor negative status [ER-]: Secondary | ICD-10-CM | POA: Insufficient documentation

## 2013-05-15 DIAGNOSIS — C50919 Malignant neoplasm of unspecified site of unspecified female breast: Secondary | ICD-10-CM | POA: Insufficient documentation

## 2013-05-15 NOTE — Progress Notes (Signed)
Patient returns after right breast lumpectomy. She also had sentinel lymph node mapping done. She is doing well. Final pathology showsEstrogen Receptor: 0%, NEGATIVE Progesterone Receptor: 0%, NEGATIVE Proliferation Marker Ki67: 14% COMMENT: The negative hormone receptor study(ies) in this case have an internal positive control. REFERENCE RANGE ESTROGEN RECEPTOR NEGATIVE <1% POSITIVE =>1% PROGESTERONE RECEPTOR NEGATIVE <1% POSITIVE =>1% All controls stained appropriately Pecola Leisure MD Pathologist, Electronic Signature ( Signed 05/10/2013) 4. CHROMOGENIC IN-SITU HYBRIDIZATION Results: HER-2/NEU BY CISH - NO AMPLIFICATION OF HER-2 DETECTED. RESULT RATIO OF HER2: CEP 17 SIGNALS 1.67 1 of 4 FINAL for Caitlin Mcneil, Caitlin Mcneil [phone]) ADDITIONAL INFORMATION:(continued) AVERAGE HER2 COPY NUMBER PER CELL 2.00 REFERENCE RANGE NEGATIVE HER2/Chr17 Ratio <2.0 and Average HER2 copy number <4.0 EQUIVOCAL HER2/Chr17 Ratio <2.0 and Average HER2 copy number 4.0 and <6.0 POSITIVE HER2/Chr17 Ratio >=2.0 and/or Average HER2 copy number >=6.0 Pecola Leisure MD Pathologist, Electronic Signature ( Signed 05/09/2013) FINAL DIAGNOSIS Diagnosis 1. Lymph node, sentinel, biopsy, Right axillary, #1 - THERE IS NO EVIDENCE OF CARCINOMA IN 1 OF 1 LYMPH NODE. (0/1). - SEE COMMENT. 2. Lymph node, sentinel, biopsy, Right axillary, #2 - THERE IS NO EVIDENCE OF CARCINOMA IN 1 OF 1 LYMPH NODE. (0/1). - SEE COMMENT. 3. Lymph node, sentinel, biopsy, Right axillary, #3 - THERE IS NO EVIDENCE OF CARCINOMA IN 1 OF 1 LYMPH NODE (0/1). - SEE COMMENT. 4. Breast, lumpectomy, Right - INVASIVE DUCTAL CARCINOMA, GRADE I/III, SPANNING AT LEAST 0.35 CM. - DUCTAL CARCINOMA IN SITU, HIGH GRADE. - THE SURGICAL RESECTION MARGINS ARE NEGATIVE FOR CARCINOMA. - SEE ONCOLOGY TABLE BELOW. Microscopic Comment 4. BREAST, INVASIVE TUMOR, WITH LYMPH NODE SAMPLING Specimen, including laterality and lymph node sampling (sentinel,  non-sentinel): Right breast and three sentinel nodes. Procedure: Needle localized lumpectomy and sentinel lymph node resection. Histologic type: Ductal. Grade: II Tubule formation: 3 Nuclear pleomorphism: 2 Mitotic:1 Tumor size (glass slide measurement): 0.35 cm, see comment. Margins: Negative for carcinoma. Invasive, distance to closest margin: Greater than 0.2 cm to all margins (gross measurement). In-situ, distance to closest margin: Greater than 0.2 cm to all margins (gross measurement). Lymphovascular invasion: Not identified. Ductal carcinoma in situ: Present. Grade: High grade. Extensive intraductal component: Yes. Lobular neoplasia: Not identified. Tumor focality: Unifocal. 2 of 4 FINAL for Caitlin Mcneil, Caitlin Mcneil ([id number]) Microscopic Comment(continued) Treatment effect: N/A. Extent of tumor: Tumor confined to breast parenchyma. Lymph nodes: Examined: 3 Sentinel 0 Non-sentinel 3 Total Lymph nodes with metastasis: 0 Breast prognostic profile: Will be performed on the invasive component of the current case and results reported separately. Non-neoplastic breast: Healing biopsy site. TNM: pT1a, pN0 Comments: The invasive tumor cells are positive for cytokeratin AE1/AE3 and E-Cadherin, supporting the above diagnosis. The invasive component spans at least 0.35 cm, but is present at a non-margin tissue edge, and therefore the span may be greater. Immunohistochemical stains performed on parts 1-3 fail to highlight the presence of cytokeratin positive tumor cells. (JBK:ecj 05/03/2013) Pecola Leisure MD Pathologist, Electronic Signature (Case signed 05/04/2013)   Exam: Incisions clean dry and intact right breast. Right axilla without signs of swelling or infection.  Impression: T1 A. N0 MX triple negative right breast cancer with background of DCIS  Plan: She is seeing radiation oncology today. She'll need to see oncology as well. Return in 3 months.

## 2013-05-15 NOTE — Progress Notes (Signed)
Radiation Oncology         [id number]) [phone] ________________________________  Name: Caitlin Mcneil MRN: [id number]  Date: 05/15/2013  DOB: 05-Apr-1956  Follow-Up Visit Note  CC: Freddy Finner, MD  Cornett, Clovis Pu., MD Regino Bellow, MD  Diagnosis:   Invasive ductal carcinoma of the right breast, T1 A. N0 M0, triple negative  Interval Since Last Radiation:  Not applicable   Narrative:  The patient returns today for routine follow-up.  The patient was originally seen in multidisciplinary breast clinic. The patient was diagnosed originally with DCIS of the right breast and proceeded with a lumpectomy on 05/02/2013. Final pathology revealed a small area of invasive ductal carcinoma, grade 1, in the setting of high grade DCIS. The surgical margins were negative. The invasive disease measured 0.35 cm. Receptor studies have indicated that the tumor is ER negative, PR negative, HER-2/neu negative.   The patient has been recovering well from surgery and presents today for discussion of adjuvant radiotherapy. She is scheduled to see Dr. Park Breed in medical oncology on 06/02/2013.                               ALLERGIES:  is allergic to penicillins and aspirin.  Meds: Current Outpatient Prescriptions  Medication Sig Dispense Refill  . Acetaminophen (TYLENOL ARTHRITIS PAIN PO) Take 2 tablets by mouth 2 (two) times daily.      . cholecalciferol (VITAMIN D) 1000 UNITS tablet Take 1,000 Units by mouth daily.      . clonazePAM (KLONOPIN) 0.5 MG tablet Take 0.5 mg by mouth 2 (two) times daily as needed for anxiety.      . Estradiol (VAGIFEM) 10 MCG TABS vaginal tablet Place vaginally 3 (three) times a week.      . Loperamide HCl (IMODIUM PO) Take 2 tablets by mouth daily as needed.       . pregabalin (LYRICA) 100 MG capsule Take 100 mg by mouth 2 (two) times daily.      . temazepam (RESTORIL) 15 MG capsule Take 30 mg by mouth at bedtime as needed for sleep.      . ziprasidone (GEODON) 40 MG capsule Take 40  mg by mouth daily with supper.      . zolpidem (AMBIEN CR) 12.5 MG CR tablet Take 12.5 mg by mouth at bedtime as needed for sleep.      . pseudoephedrine (SUDAFED) 30 MG tablet Take 30 mg by mouth every 4 (four) hours as needed for congestion.       No current facility-administered medications for this encounter.    Physical Findings: The patient is in no acute distress. Patient is alert and oriented.  height is 5' 3.25" (1.607 m) and weight is 172 lb (78.019 kg). Her oral temperature is 98.3 F (36.8 C). Her blood pressure is 123/83 and her pulse is 76. Her respiration is 20. .   A well-healed surgical incision is present within the right breast and the right axilla. No sign of infection.  Lab Findings: Lab Results  Component Value Date   WBC 8.6 04/05/2013   HGB 14.3 05/02/2013   HCT 39.4 04/05/2013   MCV 91.6 04/05/2013   PLT 146 04/05/2013     Radiographic Findings: Nm Sentinel Node Inj-no Rpt (breast)  05/02/2013   CLINICAL DATA: DCIS   Sulfur colloid was injected intradermally by the nuclear medicine  technologist for breast cancer sentinel node localization.    Mm Rt  Plc Breast Loc Dev   1st Lesion  Inc Mammo Guide  05/02/2013   *RADIOLOGY REPORT*  Clinical Data: Preoperative localization for surgical excision of right breast DCIS.  NEEDLE LOCALIZATION WITH MAMMOGRAPHIC GUIDANCE AND SPECIMEN RADIOGRAPH  Comparison: Previous exam(s)  Patient presents for needle localization prior to surgical excision of right breast DCIS. I met with the patient and we discussed the procedure of needle localization including benefits and alternatives. We discussed the high likelihood of a successful procedure. We discussed the risks of the procedure, including infection, bleeding, tissue injury, and further surgery. Informed, written consent was given. The usual time-out protocol was performed immediately prior to the procedure.  Using mammographic guidance, sterile technique, 2% lidocaine and a 5 cm  modified Kopans needle, the clip and residual calcifications were localized using a lateral approach. The films are marked for Dr. Luisa Hart.  Specimen radiograph confirms the clip, residual calcifications, and intact wire to be present in the tissue sample. The specimen is marked for pathology.  IMPRESSION: Needle localization of the right breast as discussed above. No apparent complications.   Original Report Authenticated By: Rolla Plate, M.D.    Impression:    The patient is status post a lumpectomy - final pathology revealed a small triple negative invasive ductal carcinoma, T1 A. N0 M0. The patient was originally seen for DCIS. She is appropriate to proceed with adjuvant radiotherapy at the appropriate time. We discussed this including the expected benefit once again of adjuvant radiotherapy as well as the possible side effects and risks. All of her questions were answered.  The patient's stage did increase to stage I and because this is triple negative we need to get a final decision on chemotherapy prior to proceeding with treatment.  Plan:   I will contact Dr. Park Breed about the patient's final pathology and then we can schedule the patient to return to our clinic at the appropriate time. I anticipate treating the patient with 6-1/2 weeks of radiotherapy. She may benefit from a prone breast technique although she is not sure that she can tolerate this. We will see how she does at the time of simulation.    Radene Gunning, M.D., Ph.D.

## 2013-05-15 NOTE — Patient Instructions (Addendum)
Return 3 months.  No restrictions.

## 2013-05-15 NOTE — Progress Notes (Signed)
Please see the Nurse Progress Note in the MD Initial Consult Encounter for this patient. 

## 2013-05-15 NOTE — Progress Notes (Signed)
No c/o pain, low energy but not working now,vitals wnl, 99% room air, slight soreness right breast, incisions well healed, seen by Dr. Jannette Spanner this am 1:28 PM

## 2013-05-18 ENCOUNTER — Telehealth: Payer: Self-pay | Admitting: Emergency Medicine

## 2013-05-18 NOTE — Telephone Encounter (Signed)
Left message with patient for appointment on 10/27 at 1:00 with Dr Welton Flakes.

## 2013-05-22 ENCOUNTER — Ambulatory Visit (HOSPITAL_BASED_OUTPATIENT_CLINIC_OR_DEPARTMENT_OTHER): Payer: BC Managed Care – PPO | Admitting: Oncology

## 2013-05-22 ENCOUNTER — Encounter: Payer: Self-pay | Admitting: Oncology

## 2013-05-22 VITALS — BP 130/84 | HR 78 | Temp 97.7°F | Resp 18

## 2013-05-22 DIAGNOSIS — C50519 Malignant neoplasm of lower-outer quadrant of unspecified female breast: Secondary | ICD-10-CM

## 2013-05-22 DIAGNOSIS — Z170 Estrogen receptor positive status [ER+]: Secondary | ICD-10-CM

## 2013-05-22 DIAGNOSIS — C50511 Malignant neoplasm of lower-outer quadrant of right female breast: Secondary | ICD-10-CM

## 2013-05-22 NOTE — Progress Notes (Signed)
Caitlin Mcneil [id number] 08/26/1955 57 y.o. 05/22/2013 2:08 PM  CC  Freddy Finner, MD [address] [place] [address] Dr. Harriette Bou[address] Dr. 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 Patient was seen in the multidisciplinary breast clinic for discussion of treatment options.  STAGE:   Breast cancer of lower-outer quadrant of right female breast   Primary site: Breast (Right)   Staging method: AJCC 7th Edition   Clinical: Stage 0 (Tis (DCIS), N0, cM0)   Summary: Stage 0 (Tis (DCIS), N0, cM0) Pathologic staging: T1 N0(stage I)  REFERRING PHYSICIAN: Dr. [name] Cornett  HISTORY OF PRESENT ILLNESS:  Caitlin Mcneil is a 57 y.o. female.  With bipolar disorder and neuropathy.   #1 Patient had a screening mammogram performed that showed calcifications in the right breast. She had magnification views of the right breast that showed 2 x 5 mm of the calcifications within the lower outer quadrant. A circumscribed oval mass was also present in the upper left breast representing a benign or nipple cyst on ultrasound. 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  #2 Patient had an MRI scan attempted but it was not performed due to claustrophobia.  Her pathology and radiology were reviewed at the multidisciplinary breast conference. She is now seen in the multidisciplinary breast clinic for discussion of treatment options.  #3 patient is now status post right lumpectomy with sentinel lymph node biopsy on 05/02/2013. Pathology did reveal a T1 N0 disease. The invasive component was 0.35 cm. Intermediate grade. 3 sentinel nodes were negative. Tumor was triple negative.  Interval history: Patient is seen in followup after her surgery. She is accompanied by  her husband. Overall she feels well. She tolerated the surgery well she's had a good cosmetic result. She denies any nausea vomiting fevers chills night sweats headaches shortness of breath chest pains palpitations no myalgias and arthralgias. Remainder of the 10 point review of systems is negative.  Current therapy: Proceed with radiation therapy  Past Medical History: Past Medical History  Diagnosis Date  . Bipolar 1 disorder   . Anxiety   . Depression   . Contact lens/glasses fitting     wears contacts or glasses  . Breast cancer 03/30/13    right breast    Past Surgical History: Past Surgical History  Procedure Laterality Date  . Foot surgery  2002    lt hammer toe-reconstruction  . Abdominal hysterectomy  1996  . Wisdom tooth extraction    . Hardware removal  2002    lt  foot hardware out  . Breast lumpectomy with needle localization and axillary sentinel lymph node bx Right 05/02/2013    Procedure: BREAST LUMPECTOMY WITH NEEDLE LOCALIZATION AND AXILLARY SENTINEL LYMPH NODE BX;  Surgeon: Clovis Pu. Cornett, MD;  Location: [facility];  Service: General;  Laterality: Right;  . Breast biopsy Right 03/30/13    Ductal carcinoma in situ/calcifications,loq    Family History: Family History  Problem Relation Age of Onset  . Colon cancer Father     Social History History  Substance Use Topics  . Smoking status: Never Smoker   . Smokeless tobacco: Never Used  . Alcohol Use: No    Allergies: Allergies  Allergen Reactions  . Penicillins Anaphylaxis  . Aspirin Rash  Current Medications: Current Outpatient Prescriptions  Medication Sig Dispense Refill  . Acetaminophen (TYLENOL ARTHRITIS PAIN PO) Take 2 tablets by mouth 2 (two) times daily.      . cholecalciferol (VITAMIN D) 1000 UNITS tablet Take 1,000 Units by mouth daily.      . clonazePAM (KLONOPIN) 0.5 MG tablet Take 0.5 mg by mouth 2 (two) times daily as needed for anxiety.      . Estradiol (VAGIFEM)  10 MCG TABS vaginal tablet Place vaginally 3 (three) times a week.      . pregabalin (LYRICA) 100 MG capsule Take 100 mg by mouth 2 (two) times daily.      . temazepam (RESTORIL) 15 MG capsule Take 30 mg by mouth at bedtime as needed for sleep.      . ziprasidone (GEODON) 40 MG capsule Take 40 mg by mouth daily with supper.      . zolpidem (AMBIEN CR) 12.5 MG CR tablet Take 12.5 mg by mouth at bedtime as needed for sleep.      . Loperamide HCl (IMODIUM PO) Take 2 tablets by mouth daily as needed.       . pseudoephedrine (SUDAFED) 30 MG tablet Take 30 mg by mouth every 4 (four) hours as needed for congestion.       No current facility-administered medications for this visit.    OB/GYN History:menarche at age 89 patient is postmenopausal she has been on hormone replacement therapy for 18 years. She is currently using them and I asked her to discontinue. Patient's first live birth was at 55.  Fertility Discussion:not applicable Prior History of Cancer:no prior  Health Maintenance:  Colonoscopyjust 2007 Bone Density2013 Last PAP smearup to date  ECOG PERFORMANCE STATUS: 1 - Symptomatic but completely ambulatory  Genetic Counseling/testing:no  REVIEW OF SYSTEMS: Comprehensive review of systems was obtained. It was significant for anxiety depression phobia as bruising easily as well as arthritis. Patient also complains of fatigue pain in her feet. Remainder of the 14 point review of systems was negative.  PHYSICAL EXAMINATION: Blood pressure 130/84, pulse 78, temperature 97.7 F (36.5 C), resp. rate 18, SpO2 98.00%.  WUJ:WJXBJ, healthy, no distress, well nourished, well developed and anxious SKIN: skin color, texture, turgor are normal HEAD: Normocephalic EYES: PERRLA, EOMI, Conjunctiva are pink and non-injected EARS: External ears normal OROPHARYNX:no exudate, no erythema and lips, buccal mucosa, and tongue normal  NECK: no adenopathy, thyroid normal size, non-tender, without  nodularity LYMPH:  no palpable lymphadenopathy, no hepatosplenomegaly BREAST:breasts appear normal, no suspicious masses, no skin or nipple changes or axillary nodes LUNGS: clear to auscultation  HEART: regular rate & rhythm ABDOMEN:abdomen soft, non-tender, obese, normal bowel sounds and no masses or organomegaly BACK: Back symmetric, no curvature., No CVA tenderness EXTREMITIES:no edema, no clubbing, no cyanosis  NEURO: alert & oriented x 3 with fluent speech, no focal motor/sensory deficits, gait normal     STUDIES/RESULTS: US Breast Left  30-Mar-2013   *RADIOLOGY REPORT*  Clinical Data:  57 year old female with abnormal screening mammogram - right breast calcifications.  Also palpable left breast lump discovered on self examination.  DIGITAL DIAGNOSTIC BILATERAL MAMMOGRAM  AND LEFT BREAST ULTRASOUND:  Comparison:  02/23/2013 screening mammogram and prior mammograms dating back to 10/20/2007  Findings:  ACR Breast Density Category d:  The breast tissue is extremely dense, which lowers the sensitivity of mammography.  Magnification views of the right breast demonstrate a 2 x 5 mm group of heterogeneous calcifications within the anterior lower outer right breast.  No  definite associated mass is noted. There is A circumscribed oval mass within the upper left breast is present.  On physical exam, a firm palpable mobile mass is identified at the 10 o'clock position of the left breast 4 cm from the nipple.  Ultrasound is performed, showing a 1.9 x 1.6 x 1.8 cm benign simple cyst at the 10 o'clock position of the left breast 4 cm from the nipple, corresponding to the area of patient concern.  IMPRESSION: Indeterminate calcifications within the anterior lower outer right breast - tissue sampling is recommended to exclude DCIS.  Benign simple cyst in the upper inner left breast, corresponding to the area of palpable concern.  BI-RADS CATEGORY 4:  Suspicious abnormality - biopsy should be considered.   RECOMMENDATION: Stereotactic guided right breast biopsy, which will be scheduled by our office.  I have discussed the findings and recommendations with the patient. Results were also provided in writing at the conclusion of the visit.  If applicable, a reminder letter will be sent to the patient regarding the next appointment.   Original Report Authenticated By: Harmon Pier, M.D.   Mm Digital Diagnostic Bilat  03/15/2013   *RADIOLOGY REPORT*  Clinical Data:  57 year old female with abnormal screening mammogram - right breast calcifications.  Also palpable left breast lump discovered on self examination.  DIGITAL DIAGNOSTIC BILATERAL MAMMOGRAM  AND LEFT BREAST ULTRASOUND:  Comparison:  02/23/2013 screening mammogram and prior mammograms dating back to 10/20/2007  Findings:  ACR Breast Density Category d:  The breast tissue is extremely dense, which lowers the sensitivity of mammography.  Magnification views of the right breast demonstrate a 2 x 5 mm group of heterogeneous calcifications within the anterior lower outer right breast.  No definite associated mass is noted. There is A circumscribed oval mass within the upper left breast is present.  On physical exam, a firm palpable mobile mass is identified at the 10 o'clock position of the left breast 4 cm from the nipple.  Ultrasound is performed, showing a 1.9 x 1.6 x 1.8 cm benign simple cyst at the 10 o'clock position of the left breast 4 cm from the nipple, corresponding to the area of patient concern.  IMPRESSION: Indeterminate calcifications within the anterior lower outer right breast - tissue sampling is recommended to exclude DCIS.  Benign simple cyst in the upper inner left breast, corresponding to the area of palpable concern.  BI-RADS CATEGORY 4:  Suspicious abnormality - biopsy should be considered.  RECOMMENDATION: Stereotactic guided right breast biopsy, which will be scheduled by our office.  I have discussed the findings and recommendations with the  patient. Results were also provided in writing at the conclusion of the visit.  If applicable, a reminder letter will be sent to the patient regarding the next appointment.   Original Report Authenticated By: Harmon Pier, M.D.   Mm Rt Breast Bx W Loc Dev 1st Lesion Image Bx Spec Stereo Guide  04/04/2013   **ADDENDUM** CREATED: 04/04/2013 16:26:18  The patient returned on 04/04/2013 after an unsuccessful attempt at MRI due to claustrophobia. She stated that she did not feel that she would be able to undergo an MRI of the breasts with medication. She returned due to a concern about the degree of bruising in her right breast and a palpable hematoma.  She also requested removal of the stitch.  On physical examination, the patient has a large area of bruising involving the majority of the inferior half of the right breast. The right breast is  also approximately 50% larger than the left breast.  She reported that the breasts are normally approximately symmetrical in size.  She also has an approximately 8 x 5 cm palpable hematoma in the inferior right breast, centered slightly laterally.  Post clip placement mammogram images of the right breast were obtained today.  These demonstrate a T-shaped biopsy marker clip approximately 2 cm superior and lateral to the location of the biopsied microcalcifications.  None of the targeted calcifications remain following biopsy.  There are residual loosely grouped tiny microcalcifications in that portion of the breast, 6 mm medial and inferior to the biopsy marker clip, measuring 1.2 cm in maximum diameter.  The stitch was removed from the lower outer portion of the right breast.  The wound demonstrates normal healing with no extrusion of fluid.  She will be seen in the Multidisciplinary Clinic tomorrow.  **END ADDENDUM** SIGNED BY: Londell Moh. Azucena Kuba, M.D.  03/31/2013   **ADDENDUM** CREATED: 03/31/2013 11:36:47  Addendum by Dr. Christiana Pellant on 03/31/2013 at 11:36 a.m.  I spoke with the  patient by telephone to discuss pathology results. Pathology demonstrates high-grade DCIS, which is concordant with the imaging appearance.  Multidisciplinary clinic is scheduled 04/05/2013.  MRI appointment will be scheduled but has not yet been confirmed.  The patient will return 04/06/2013 to the [facility] for mammograms of the right breast and removal of the stitch.  All questions were answered.  She reports no problems at the biopsy site overnight.  **END ADDENDUM** SIGNED BY: Harrel Lemon, M.D.  03/30/2013   *RADIOLOGY REPORT*  Clinical Data:  Suspicious right breast calcifications, lower outer quadrant  STEREOTACTIC-GUIDED VACUUM ASSISTED BIOPSY OF THE RIGHT BREAST AND SPECIMEN RADIOGRAPH  Comparison: Previous exams.  I met with the patient and we discussed the procedure of stereotactic-guided biopsy, including benefits and alternatives. We discussed the high likelihood of a successful procedure. We discussed the risks of the procedure, including infection, bleeding, tissue injury, clip migration, and inadequate sampling. Informed, written consent was given. The usual time-out protocol was performed immediately prior to the procedure.  Using sterile technique and 2% Lidocaine as local anesthetic, under stereotactic guidance, a 9 gauge vacuum-assisted device was used to perform core needle biopsy of right lower outer quadrant calcifications using a lateral to medial approach.  Specimen radiograph was performed, showing calcifications in the biopsy samples.  Specimens with calcifications are identified for pathology.  At the conclusion of the procedure, a T shaped tissue marker clip was deployed into the biopsy cavity.  Follow-up 2-view mammogram was not performed because the patient had prolonged bleeding despite manual compression and application of hemostatic topical material.  This resolved after placement of a single stitch.  IMPRESSION: Stereotactic-guided biopsy of right lower outer quadrant  calcifications with T shaped clip placement.  Pathology is pending. No apparent complications.  We will contact the patient with pathology results and she has a follow-up appointment on 04/06/2013 at 3:30 p.m. to evaluate wound healing and remove the stitch.   Original Report Authenticated By: Christiana Pellant, M.D.     LABS:    Chemistry      Component Value Date/Time   NA 142 04/05/2013 1223   K 4.1 04/05/2013 1223   CO2 28 04/05/2013 1223   BUN 22.5 04/05/2013 1223   CREATININE 0.7 04/05/2013 1223      Component Value Date/Time   CALCIUM 9.1 04/05/2013 1223   ALKPHOS 49 04/05/2013 1223   AST 13 04/05/2013 1223   ALT 9 04/05/2013  1223   BILITOT 0.56 04/05/2013 1223      Lab Results  Component Value Date   WBC 8.6 04/05/2013   HGB 14.3 05/02/2013   HCT 39.4 04/05/2013   MCV 91.6 04/05/2013   PLT 146 04/05/2013   ADDITIONAL INFORMATION: 4. PROGNOSTIC INDICATORS - ACIS Results: IMMUNOHISTOCHEMICAL AND MORPHOMETRIC ANALYSIS BY THE AUTOMATED CELLULAR IMAGING SYSTEM (ACIS) Estrogen Receptor: 0%, NEGATIVE Progesterone Receptor: 0%, NEGATIVE Proliferation Marker Ki67: 14% COMMENT: The negative hormone receptor study(ies) in this case have an internal positive control. REFERENCE RANGE ESTROGEN RECEPTOR NEGATIVE <1% POSITIVE =>1% PROGESTERONE RECEPTOR NEGATIVE <1% POSITIVE =>1% All controls stained appropriately Pecola Leisure MD Pathologist, Electronic Signature ( Signed 05/10/2013) 4. CHROMOGENIC IN-SITU HYBRIDIZATION Results: HER-2/NEU BY CISH - NO AMPLIFICATION OF HER-2 DETECTED. RESULT RATIO OF HER2: CEP 17 SIGNALS 1.67 1 of 4 FINAL for SIEARRA, AMBERG [phone]) ADDITIONAL INFORMATION:(continued) AVERAGE HER2 COPY NUMBER PER CELL 2.00 REFERENCE RANGE NEGATIVE HER2/Chr17 Ratio <2.0 and Average HER2 copy number <4.0 EQUIVOCAL HER2/Chr17 Ratio <2.0 and Average HER2 copy number 4.0 and <6.0 POSITIVE HER2/Chr17 Ratio >=2.0 and/or Average HER2 copy number >=6.0 Pecola Leisure  MD Pathologist, Electronic Signature ( Signed 05/09/2013) FINAL DIAGNOSIS Diagnosis 1. Lymph node, sentinel, biopsy, Right axillary, #1 - THERE IS NO EVIDENCE OF CARCINOMA IN 1 OF 1 LYMPH NODE. (0/1). - SEE COMMENT. 2. Lymph node, sentinel, biopsy, Right axillary, #2 - THERE IS NO EVIDENCE OF CARCINOMA IN 1 OF 1 LYMPH NODE. (0/1). - SEE COMMENT. 3. Lymph node, sentinel, biopsy, Right axillary, #3 - THERE IS NO EVIDENCE OF CARCINOMA IN 1 OF 1 LYMPH NODE (0/1). - SEE COMMENT. 4. Breast, lumpectomy, Right - INVASIVE DUCTAL CARCINOMA, GRADE I/III, SPANNING AT LEAST 0.35 CM. - DUCTAL CARCINOMA IN SITU, HIGH GRADE. - THE SURGICAL RESECTION MARGINS ARE NEGATIVE FOR CARCINOMA. - SEE ONCOLOGY TABLE BELOW. Microscopic Comment 4. BREAST, INVASIVE TUMOR, WITH LYMPH NODE SAMPLING Specimen, including laterality and lymph node sampling (sentinel, non-sentinel): Right breast and three sentinel nodes. Procedure: Needle localized lumpectomy and sentinel lymph node resection. Histologic type: Ductal. Grade: II Tubule formation: 3 Nuclear pleomorphism: 2 Mitotic:1 Tumor size (glass slide measurement): 0.35 cm, see comment. Margins: Negative for carcinoma. Invasive, distance to closest margin: Greater than 0.2 cm to all margins (gross measurement). In-situ, distance to closest margin: Greater than 0.2 cm to all margins (gross measurement). Lymphovascular invasion: Not identified. Ductal carcinoma in situ: Present. Grade: High grade. Extensive intraductal component: Yes. Lobular neoplasia: Not identified. Tumor focality: Unifocal. 2 of 4 FINAL for SALIA, CANGEMI ([id number]) Microscopic Comment(continued) Treatment effect: N/A. Extent of tumor: Tumor confined to breast parenchyma. Lymph nodes: Examined: 3 Sentinel 0 Non-sentinel 3 Total Lymph nodes with metastasis: 0 Breast prognostic profile: Will be performed on the invasive component of the current case and results reported  separately. Non-neoplastic breast: Healing biopsy site. TNM: pT1a, pN0 Comments: The invasive tumor cells are positive for cytokeratin AE1/AE3 and E-Cadherin, supporting the above diagnosis. The invasive component spans at least 0.35 cm, but is present at a non-margin tissue edge, and therefore the span may be greater. Immunohistochemical stains performed on parts 1-3 fail to highlight the presence of cytokeratin positive tumor cells. 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 Patient is also found to have a simple cyst in the right side. Patient  is seen in the [facility] clinic for discussion of treatment options. She is a good candidate for a lumpectomy with sentinel lymph node biopsy.  #2 patient is now status post lumpectomy with sentinel lymph node biopsy. Her final pathology did reveal 0.35 centimeter and focus of invasive ductal carcinoma tumor was ER negative PR negative HER-2 negative with a proliferation marker Ki-[id number]%. 3 sentinel nodes were negative for metastatic disease.  #3 patient is not need chemotherapy due to small focus of invasive cancer. I have recommended that she proceed with radiation therapy     PLAN:     #1 proceed with radiation therapy. I have sent a message to Dr. Mitzi Hansen.  #2 I will see the patient back in 2 months time in followup.    Thank you so much for allowing me to participate in the care of Mardene Speak. I will continue to follow up the patient with you and assist in her care.  All questions were answered. The patient knows to call the clinic with any problems, questions or concerns. We can certainly see the patient much sooner if necessary.  I spent 15 minutes counseling the patient face to face. The total time spent in the  appointment was 25 minutes.  Drue Second, MD Medical/Oncology [facility] [phone] (beeper) [phone] (Office)  05/22/2013, 2:08 PM

## 2013-05-22 NOTE — Patient Instructions (Signed)
#  1 proceed with radiation therapy  #2 I will see you back in about 2 months time for followup

## 2013-05-22 NOTE — Telephone Encounter (Signed)
appts made and printed...td 

## 2013-05-25 NOTE — Telephone Encounter (Signed)
Spoke with patient and per Dr Milta Deiters instructions I notified patient that she does need to discontinue the Vagifem. Patient verbalized understanding and denied any further questions or concerns at this time.

## 2013-05-26 ENCOUNTER — Ambulatory Visit
Admission: RE | Admit: 2013-05-26 | Discharge: 2013-05-26 | Disposition: A | Discharge status: Home / Self Care | Payer: BC Managed Care – PPO | Source: Ambulatory Visit | Attending: Radiation Oncology | Admitting: Radiation Oncology

## 2013-05-26 DIAGNOSIS — IMO0002 Reserved for concepts with insufficient information to code with codable children: Secondary | ICD-10-CM | POA: Insufficient documentation

## 2013-05-26 DIAGNOSIS — Y842 Radiological procedure and radiotherapy as the cause of abnormal reaction of the patient, or of later complication, without mention of misadventure at the time of the procedure: Secondary | ICD-10-CM | POA: Insufficient documentation

## 2013-05-26 DIAGNOSIS — C50511 Malignant neoplasm of lower-outer quadrant of right female breast: Secondary | ICD-10-CM

## 2013-05-26 DIAGNOSIS — L539 Erythematous condition, unspecified: Secondary | ICD-10-CM | POA: Insufficient documentation

## 2013-05-26 DIAGNOSIS — Z510 Encounter for antineoplastic radiation therapy: Secondary | ICD-10-CM | POA: Insufficient documentation

## 2013-05-26 DIAGNOSIS — Z79899 Other long term (current) drug therapy: Secondary | ICD-10-CM | POA: Insufficient documentation

## 2013-05-26 DIAGNOSIS — L589 Radiodermatitis, unspecified: Secondary | ICD-10-CM | POA: Insufficient documentation

## 2013-05-26 DIAGNOSIS — Y836 Removal of other organ (partial) (total) as the cause of abnormal reaction of the patient, or of later complication, without mention of misadventure at the time of the procedure: Secondary | ICD-10-CM | POA: Insufficient documentation

## 2013-05-26 DIAGNOSIS — C50919 Malignant neoplasm of unspecified site of unspecified female breast: Secondary | ICD-10-CM | POA: Insufficient documentation

## 2013-05-26 NOTE — Progress Notes (Signed)
  Radiation Oncology         [phone]) [phone] ________________________________  Name: Caitlin Mcneil MRN: [id number]  Date: 05/26/2013  DOB: [birth year]  SIMULATION AND TREATMENT PLANNING NOTE  The patient presented for simulation prior to beginning her course of radiation treatment for her diagnosis of right-sided breast cancer. The patient was placed in a supine position on a breast board. A customized accuform device was also constructed and this complex treatment device will be used on a daily basis during her treatment. In this fashion, a CT scan was obtained through the chest area and an isocenter was placed near the chest wall within the right breast.  The patient will be planned to receive a course of radiation initially to a dose of 50.4 gray. This will consist of a whole breast radiotherapy technique. To accomplish this, 2 customized blocks have been designed which will correspond to medial and lateral whole breast tangent fields. This treatment will be accomplished at 1.8 gray per fraction. A complex isodose plan is requested to ensure that the breast target area is adequately covered dosimetrically. A forward planning technique will also be evaluated to determine if this approach improves the plan. It is anticipated that the patient will then receive a 10 gray boost to the seroma cavity which has been contoured. This will be accomplished at 2 gray per fraction. The final anticipated total dose therefore will correspond to 60.4 gray.   _______________________________   Radene Gunning, MD, PhD

## 2013-06-01 ENCOUNTER — Other Ambulatory Visit: Payer: Self-pay

## 2013-06-02 ENCOUNTER — Ambulatory Visit: Payer: BC Managed Care – PPO | Admitting: Radiation Oncology

## 2013-06-02 ENCOUNTER — Encounter: Payer: Self-pay | Admitting: Radiation Oncology

## 2013-06-02 ENCOUNTER — Ambulatory Visit
Admission: RE | Admit: 2013-06-02 | Discharge: 2013-06-02 | Disposition: A | Discharge status: Home / Self Care | Payer: BC Managed Care – PPO | Source: Ambulatory Visit | Attending: Radiation Oncology | Admitting: Radiation Oncology

## 2013-06-02 NOTE — Progress Notes (Signed)
Simulation Verification Note Outpatient; Right breast   The patient was brought to the treatment unit and placed in the planned treatment position. The clinical setup was verified. Then port films were obtained and uploaded to the radiation oncology medical record software.  The treatment beams were carefully compared against the planned radiation fields. The position location and shape of the radiation fields was reviewed. They targeted volume of tissue appears to be appropriately covered by the radiation beams. Organs at risk appear to be excluded as planned.  Based on my personal review, I approved the simulation verification. The patient's treatment will proceed as planned.  -----------------------------------  Caitlin Graham, MD  

## 2013-06-05 ENCOUNTER — Ambulatory Visit: Payer: BC Managed Care – PPO | Admitting: Radiation Oncology

## 2013-06-05 ENCOUNTER — Encounter: Payer: Self-pay | Admitting: Radiation Oncology

## 2013-06-05 ENCOUNTER — Ambulatory Visit
Admission: RE | Admit: 2013-06-05 | Discharge: 2013-06-05 | Disposition: A | Discharge status: Home / Self Care | Payer: BC Managed Care – PPO | Source: Ambulatory Visit | Attending: Radiation Oncology | Admitting: Radiation Oncology

## 2013-06-05 DIAGNOSIS — C50511 Malignant neoplasm of lower-outer quadrant of right female breast: Secondary | ICD-10-CM

## 2013-06-05 MED ORDER — ALRA NON-METALLIC DEODORANT (RAD-ONC)
1.0000 "application " | Freq: Once | TOPICAL | Status: AC
  Administered 2013-06-05: 1 via TOPICAL

## 2013-06-05 MED ORDER — RADIAPLEXRX EX GEL
Freq: Once | CUTANEOUS | Status: AC
  Administered 2013-06-05: 15:00:00 via TOPICAL

## 2013-06-05 NOTE — Progress Notes (Signed)
Patient education done, radiation therapy and you book given, my business card, radiaplex gel and alra deodorant  Given to patient, discusses skin irritation, fatigue, pain, will see MD weekly/prn,  Can call for any questions/concerns,teach back done 11:45 AM

## 2013-06-06 ENCOUNTER — Ambulatory Visit
Admission: RE | Admit: 2013-06-06 | Discharge: 2013-06-06 | Disposition: A | Discharge status: Home / Self Care | Payer: BC Managed Care – PPO | Source: Ambulatory Visit | Attending: Radiation Oncology | Admitting: Radiation Oncology

## 2013-06-06 ENCOUNTER — Ambulatory Visit: Payer: BC Managed Care – PPO

## 2013-06-07 ENCOUNTER — Ambulatory Visit: Payer: BC Managed Care – PPO

## 2013-06-07 ENCOUNTER — Ambulatory Visit
Admission: RE | Admit: 2013-06-07 | Discharge: 2013-06-07 | Disposition: A | Discharge status: Home / Self Care | Payer: BC Managed Care – PPO | Source: Ambulatory Visit | Attending: Radiation Oncology | Admitting: Radiation Oncology

## 2013-06-08 ENCOUNTER — Encounter: Payer: Self-pay | Admitting: Genetic Counselor

## 2013-06-08 ENCOUNTER — Other Ambulatory Visit: Payer: BC Managed Care – PPO | Admitting: Lab

## 2013-06-08 ENCOUNTER — Ambulatory Visit (HOSPITAL_BASED_OUTPATIENT_CLINIC_OR_DEPARTMENT_OTHER): Payer: BC Managed Care – PPO | Admitting: Genetic Counselor

## 2013-06-08 ENCOUNTER — Ambulatory Visit
Admission: RE | Admit: 2013-06-08 | Discharge: 2013-06-08 | Disposition: A | Discharge status: Home / Self Care | Payer: BC Managed Care – PPO | Source: Ambulatory Visit | Attending: Radiation Oncology | Admitting: Radiation Oncology

## 2013-06-08 ENCOUNTER — Ambulatory Visit: Payer: BC Managed Care – PPO

## 2013-06-08 DIAGNOSIS — C50519 Malignant neoplasm of lower-outer quadrant of unspecified female breast: Secondary | ICD-10-CM

## 2013-06-08 DIAGNOSIS — Z800 Family history of malignant neoplasm of digestive organs: Secondary | ICD-10-CM

## 2013-06-08 DIAGNOSIS — IMO0002 Reserved for concepts with insufficient information to code with codable children: Secondary | ICD-10-CM

## 2013-06-08 DIAGNOSIS — Z171 Estrogen receptor negative status [ER-]: Secondary | ICD-10-CM

## 2013-06-08 DIAGNOSIS — C50511 Malignant neoplasm of lower-outer quadrant of right female breast: Secondary | ICD-10-CM

## 2013-06-08 NOTE — Progress Notes (Signed)
Dr.  Drue Second requested a consultation for genetic counseling and risk assessment for Caitlin Mcneil, a 57 y.o. female, for discussion of her personal history of breast cancer and family history of colon cancer.  She presents to clinic today to discuss the possibility of a genetic predisposition to cancer, and to further clarify her risks, as well as her family members' risks for cancer.   HISTORY OF PRESENT ILLNESS: In 2014, at the age of 58, Caitlin Mcneil was diagnosed with triple negative breast cancer. This was treated with lumpectomy and radiation.  She is not having chemotherapy.  The patient has had a colonoscopy and did not have polyps.      Past Medical History  Diagnosis Date  . Bipolar 1 disorder   . Anxiety   . Depression   . Contact lens/glasses fitting     wears contacts or glasses  . Breast cancer 03/30/13    right breast    Past Surgical History  Procedure Laterality Date  . Foot surgery  2002    lt hammer toe-reconstruction  . Abdominal hysterectomy  1996  . Wisdom tooth extraction    . Hardware removal  2002    lt  foot hardware out  . Breast lumpectomy with needle localization and axillary sentinel lymph node bx Right 05/02/2013    Procedure: BREAST LUMPECTOMY WITH NEEDLE LOCALIZATION AND AXILLARY SENTINEL LYMPH NODE BX;  Surgeon: Clovis Pu. Cornett, MD;  Location: [facility];  Service: General;  Laterality: Right;  . Breast biopsy Right 03/30/13    Ductal carcinoma in situ/calcifications,loq    History   Social History  . Marital Status: Married    Spouse Name: N/A    Number of Children: 1  . Years of Education: N/A   Occupational History  .     Social History Main Topics  . Smoking status: Never Smoker   . Smokeless tobacco: Never Used  . Alcohol Use: No  . Drug Use: No  . Sexual Activity: Not Currently   Other Topics Concern  . None   Social History Narrative  . None    REPRODUCTIVE HISTORY AND PERSONAL RISK ASSESSMENT  FACTORS: Menarche was at age 12.   postmenopausal Uterus Intact: no Ovaries Intact: no G1P1A0, first live birth at age 9  She has not previously undergone treatment for infertility.   Oral Contraceptive use: 0 years   She has used HRT in the past.    FAMILY HISTORY:  We obtained a detailed, 4-generation family history.  Significant diagnoses are listed below: Family History  Problem Relation Age of Onset  . Colon cancer Father 48  . Alzheimer's disease Mother   . Alzheimer's disease Maternal Grandmother   . Cancer Maternal Grandfather     Cancer - NOS  . Breast cancer Other     maternal great aunt    Patient's maternal ancestors are of [place] and [place] descent, and paternal ancestors are of [place] descent. There is no reported Ashkenazi Jewish ancestry. There is no known consanguinity.  GENETIC COUNSELING ASSESSMENT: Caitlin Mcneil is a 57 y.o. female with a personal history of triple negative breast cancer which somewhat suggestive of a hereditary cancer syndrome and predisposition to cancer. We, therefore, discussed and recommended the following at today's visit.   DISCUSSION: We reviewed the characteristics, features and inheritance patterns of hereditary cancer syndromes. We also discussed genetic testing, including the appropriate family members to test, the process of testing, insurance coverage  and turn-around-time for results. We reviewed the most common forms of hereditary cancer syndromes based on triple negative status, including BRCA mutations and PALB2 mutations.  The patient and her husband are concerned about the combination of breast and colon cancer, which can sometimes be associated with CHEK2 mutations or Lynch syndrome.  We did discuss that the diagnosis of colon cancer occurred at a typical age of onset of cancer and therefore was not too concerning the light of a lack of family history of colon cancer.  Based on the triple negative breast cancer we  recommend the breast/ovarian cancer panel.  PLAN: After considering the risks, benefits, and limitations, Caitlin Mcneil provided informed consent to pursue genetic testing and the blood sample will be sent to [facility] for analysis of the Breast/Ovarian Cancer Panel. We discussed the implications of a positive, negative and/ or variant of uncertain significance genetic test result. Results should be available within approximately 3 weeks' time, at which point they will be disclosed by telephone to Caitlin Mcneil, as will any additional recommendations warranted by these results. Caitlin Mcneil will receive a summary of her genetic counseling visit and a copy of her results once available. This information will also be available in Epic. We encouraged Caitlin Mcneil to remain in contact with cancer genetics annually so that we can continuously update the family history and inform her of any changes in cancer genetics and testing that may be of benefit for her family. Caitlin Mcneil's questions were answered to her satisfaction today. Our contact information was provided should additional questions or concerns arise.  The patient was seen for a total of 60 minutes, greater than 50% of which was spent face-to-face counseling.  This note will also be sent to the referring provider via the electronic medical record. The patient will be supplied with a summary of this genetic counseling discussion as well as educational information on the discussed hereditary cancer syndromes following the conclusion of their visit.   Patient was discussed with Dr. Drue Second.   _______________________________________________________________________ For Office Staff:  Number of people involved in session: 2 Was an Intern/ student involved with case: no

## 2013-06-09 ENCOUNTER — Ambulatory Visit
Admission: RE | Admit: 2013-06-09 | Discharge: 2013-06-09 | Disposition: A | Discharge status: Home / Self Care | Payer: BC Managed Care – PPO | Source: Ambulatory Visit | Attending: Radiation Oncology | Admitting: Radiation Oncology

## 2013-06-09 ENCOUNTER — Ambulatory Visit: Payer: BC Managed Care – PPO

## 2013-06-09 VITALS — BP 119/83 | HR 79 | Temp 98.0°F | Wt 174.3 lb

## 2013-06-09 DIAGNOSIS — C50511 Malignant neoplasm of lower-outer quadrant of right female breast: Secondary | ICD-10-CM

## 2013-06-09 NOTE — Progress Notes (Signed)
Weekly under treat of radiation to right breast.Completed 5 of 28 treatments.No skin changes.

## 2013-06-09 NOTE — Progress Notes (Signed)
   Department of Radiation Oncology  Phone:  [phone] Fax:        [phone]  Weekly Treatment Note    Name: Caitlin Mcneil Date: 06/09/2013 MRN: [id number] DOB: 21-Aug-1955   Current dose: 9 Gy  Current fraction: 5   MEDICATIONS: Current Outpatient Prescriptions  Medication Sig Dispense Refill  . Acetaminophen (TYLENOL ARTHRITIS PAIN PO) Take 2 tablets by mouth 2 (two) times daily.      . cholecalciferol (VITAMIN D) 1000 UNITS tablet Take 1,000 Units by mouth daily.      . clonazePAM (KLONOPIN) 0.5 MG tablet Take 0.5 mg by mouth 2 (two) times daily as needed for anxiety.      . hyaluronate sodium (RADIAPLEXRX) GEL Apply 1 application topically 2 (two) times daily. Apply to affected area after rad txs and bedtime      . Loperamide HCl (IMODIUM PO) Take 2 tablets by mouth daily as needed.       . non-metallic deodorant Thornton Papas) MISC Apply 1 application topically daily as needed. Apply daily after rad txs      . pregabalin (LYRICA) 100 MG capsule Take 100 mg by mouth 2 (two) times daily.      . pseudoephedrine (SUDAFED) 30 MG tablet Take 30 mg by mouth every 4 (four) hours as needed for congestion.      . temazepam (RESTORIL) 15 MG capsule Take 30 mg by mouth at bedtime as needed for sleep.      . ziprasidone (GEODON) 40 MG capsule Take 40 mg by mouth daily with supper.      . zolpidem (AMBIEN CR) 12.5 MG CR tablet Take 12.5 mg by mouth at bedtime as needed for sleep.       No current facility-administered medications for this encounter.     ALLERGIES: Penicillins and Aspirin   LABORATORY DATA:  Lab Results  Component Value Date   WBC 8.6 04/05/2013   HGB 14.3 05/02/2013   HCT 39.4 04/05/2013   MCV 91.6 04/05/2013   PLT 146 04/05/2013   Lab Results  Component Value Date   NA 142 04/05/2013   K 4.1 04/05/2013   CO2 28 04/05/2013   Lab Results  Component Value Date   ALT 9 04/05/2013   AST 13 04/05/2013   ALKPHOS 49 04/05/2013   BILITOT 0.56 04/05/2013      NARRATIVE: Caitlin Mcneil was seen today for weekly treatment management. The chart was checked and the patient's films were reviewed. The patient did very well with her first week of treatment. No complaints. All of her questions were answered.  PHYSICAL EXAMINATION: weight is 174 lb 4.8 oz (79.062 kg). Her temperature is 98 F (36.7 C). Her blood pressure is 119/83 and her pulse is 79.        ASSESSMENT: The patient is doing satisfactorily with treatment.  PLAN: We will continue with the patient's radiation treatment as planned.

## 2013-06-12 ENCOUNTER — Ambulatory Visit: Payer: BC Managed Care – PPO

## 2013-06-12 ENCOUNTER — Ambulatory Visit
Admission: RE | Admit: 2013-06-12 | Discharge: 2013-06-12 | Disposition: A | Discharge status: Home / Self Care | Payer: BC Managed Care – PPO | Source: Ambulatory Visit | Attending: Radiation Oncology | Admitting: Radiation Oncology

## 2013-06-12 ENCOUNTER — Ambulatory Visit: Payer: BC Managed Care – PPO | Admitting: Oncology

## 2013-06-13 ENCOUNTER — Ambulatory Visit: Payer: BC Managed Care – PPO

## 2013-06-13 ENCOUNTER — Ambulatory Visit
Admission: RE | Admit: 2013-06-13 | Discharge: 2013-06-13 | Disposition: A | Discharge status: Home / Self Care | Payer: BC Managed Care – PPO | Source: Ambulatory Visit | Attending: Radiation Oncology | Admitting: Radiation Oncology

## 2013-06-14 ENCOUNTER — Ambulatory Visit
Admission: RE | Admit: 2013-06-14 | Discharge: 2013-06-14 | Disposition: A | Discharge status: Home / Self Care | Payer: BC Managed Care – PPO | Source: Ambulatory Visit | Attending: Radiation Oncology | Admitting: Radiation Oncology

## 2013-06-14 ENCOUNTER — Ambulatory Visit: Payer: BC Managed Care – PPO

## 2013-06-15 ENCOUNTER — Ambulatory Visit: Payer: BC Managed Care – PPO

## 2013-06-15 ENCOUNTER — Ambulatory Visit
Admission: RE | Admit: 2013-06-15 | Discharge: 2013-06-15 | Disposition: A | Discharge status: Home / Self Care | Payer: BC Managed Care – PPO | Source: Ambulatory Visit | Attending: Radiation Oncology | Admitting: Radiation Oncology

## 2013-06-16 ENCOUNTER — Ambulatory Visit
Admission: RE | Admit: 2013-06-16 | Discharge: 2013-06-16 | Disposition: A | Discharge status: Home / Self Care | Payer: BC Managed Care – PPO | Source: Ambulatory Visit | Attending: Radiation Oncology | Admitting: Radiation Oncology

## 2013-06-16 ENCOUNTER — Ambulatory Visit: Payer: BC Managed Care – PPO

## 2013-06-16 ENCOUNTER — Encounter: Payer: Self-pay | Admitting: Radiation Oncology

## 2013-06-16 VITALS — BP 114/77 | HR 77 | Temp 97.7°F | Resp 20 | Wt 174.4 lb

## 2013-06-16 DIAGNOSIS — C50511 Malignant neoplasm of lower-outer quadrant of right female breast: Secondary | ICD-10-CM

## 2013-06-16 NOTE — Progress Notes (Signed)
   Department of Radiation Oncology  Phone:  [phone] Fax:        [phone]  Weekly Treatment Note    Name: Caitlin Mcneil Date: 06/16/2013 MRN: [id number] DOB: 19-Nov-1955   Current dose: 18 Gy  Current fraction: 10   MEDICATIONS: Current Outpatient Prescriptions  Medication Sig Dispense Refill  . Acetaminophen (TYLENOL ARTHRITIS PAIN PO) Take 2 tablets by mouth 2 (two) times daily.      . cholecalciferol (VITAMIN D) 1000 UNITS tablet Take 1,000 Units by mouth daily.      . clonazePAM (KLONOPIN) 0.5 MG tablet Take 0.5 mg by mouth 2 (two) times daily as needed for anxiety.      . hyaluronate sodium (RADIAPLEXRX) GEL Apply 1 application topically 2 (two) times daily. Apply to affected area after rad txs and bedtime      . Loperamide HCl (IMODIUM PO) Take 2 tablets by mouth daily as needed.       . non-metallic deodorant Thornton Papas) MISC Apply 1 application topically daily as needed. Apply daily after rad txs      . pregabalin (LYRICA) 100 MG capsule Take 100 mg by mouth 2 (two) times daily.      . pseudoephedrine (SUDAFED) 30 MG tablet Take 30 mg by mouth every 4 (four) hours as needed for congestion.      . temazepam (RESTORIL) 15 MG capsule Take 30 mg by mouth at bedtime as needed for sleep.      . ziprasidone (GEODON) 40 MG capsule Take 40 mg by mouth daily with supper.      . zolpidem (AMBIEN CR) 12.5 MG CR tablet Take 12.5 mg by mouth at bedtime as needed for sleep.       No current facility-administered medications for this encounter.     ALLERGIES: Penicillins and Aspirin   LABORATORY DATA:  Lab Results  Component Value Date   WBC 8.6 04/05/2013   HGB 14.3 05/02/2013   HCT 39.4 04/05/2013   MCV 91.6 04/05/2013   PLT 146 04/05/2013   Lab Results  Component Value Date   NA 142 04/05/2013   K 4.1 04/05/2013   CO2 28 04/05/2013   Lab Results  Component Value Date   ALT 9 04/05/2013   AST 13 04/05/2013   ALKPHOS 49 04/05/2013   BILITOT 0.56 04/05/2013      NARRATIVE: Caitlin Mcneil was seen today for weekly treatment management. The chart was checked and the patient's films were reviewed. The patient states that she is doing very well. Minimal skin change. She is using skin cream daily.  PHYSICAL EXAMINATION: weight is 174 lb 6.4 oz (79.107 kg). Her oral temperature is 97.7 F (36.5 C). Her blood pressure is 114/77 and her pulse is 77. Her respiration is 20.        ASSESSMENT: The patient is doing satisfactorily with treatment.  PLAN: We will continue with the patient's radiation treatment as planned.

## 2013-06-16 NOTE — Progress Notes (Signed)
Weekly rad txs 10/33 completed, very little skin changes, uses radiaplex bid, eating and staying hydrated, no c/o pain 11:05 AM

## 2013-06-18 ENCOUNTER — Ambulatory Visit
Admission: RE | Admit: 2013-06-18 | Discharge: 2013-06-18 | Disposition: A | Discharge status: Home / Self Care | Payer: BC Managed Care – PPO | Source: Ambulatory Visit | Attending: Radiation Oncology | Admitting: Radiation Oncology

## 2013-06-19 ENCOUNTER — Encounter: Payer: Self-pay | Admitting: Radiation Oncology

## 2013-06-19 ENCOUNTER — Ambulatory Visit: Payer: BC Managed Care – PPO

## 2013-06-19 ENCOUNTER — Ambulatory Visit
Admission: RE | Admit: 2013-06-19 | Discharge: 2013-06-19 | Disposition: A | Discharge status: Home / Self Care | Payer: BC Managed Care – PPO | Source: Ambulatory Visit | Attending: Radiation Oncology | Admitting: Radiation Oncology

## 2013-06-19 VITALS — BP 111/68 | HR 78 | Temp 98.2°F | Ht 63.25 in | Wt 175.9 lb

## 2013-06-19 DIAGNOSIS — C50511 Malignant neoplasm of lower-outer quadrant of right female breast: Secondary | ICD-10-CM

## 2013-06-19 MED ORDER — RADIAPLEXRX EX GEL
Freq: Once | CUTANEOUS | Status: AC
  Administered 2013-06-19: 12:00:00 via TOPICAL

## 2013-06-19 NOTE — Progress Notes (Signed)
   Department of Radiation Oncology  Phone:  [phone] Fax:        [phone]  Weekly Treatment Note    Name: [name] Date: 06/19/2013 MRN: [id number] DOB: 12-14-1955   Current dose: 21.6 Gy  Current fraction: 12   MEDICATIONS: Current Outpatient Prescriptions  Medication Sig Dispense Refill  . Acetaminophen (TYLENOL ARTHRITIS PAIN PO) Take 2 tablets by mouth 2 (two) times daily.      . cholecalciferol (VITAMIN D) 1000 UNITS tablet Take 1,000 Units by mouth daily.      . clonazePAM (KLONOPIN) 0.5 MG tablet Take 0.5 mg by mouth 2 (two) times daily as needed for anxiety.      . hyaluronate sodium (RADIAPLEXRX) GEL Apply 1 application topically 2 (two) times daily. Apply to affected area after rad txs and bedtime      . Loperamide HCl (IMODIUM PO) Take 2 tablets by mouth daily as needed.       . non-metallic deodorant Thornton Papas) MISC Apply 1 application topically daily as needed. Apply daily after rad txs      . pregabalin (LYRICA) 100 MG capsule Take 100 mg by mouth 2 (two) times daily.      . pseudoephedrine (SUDAFED) 30 MG tablet Take 30 mg by mouth every 4 (four) hours as needed for congestion.      . temazepam (RESTORIL) 15 MG capsule Take 30 mg by mouth at bedtime as needed for sleep.      . ziprasidone (GEODON) 40 MG capsule Take 40 mg by mouth daily with supper.      . zolpidem (AMBIEN CR) 12.5 MG CR tablet Take 12.5 mg by mouth at bedtime as needed for sleep.       No current facility-administered medications for this encounter.     ALLERGIES: Penicillins and Aspirin   LABORATORY DATA:  Lab Results  Component Value Date   WBC 8.6 04/05/2013   HGB 14.3 05/02/2013   HCT 39.4 04/05/2013   MCV 91.6 04/05/2013   PLT 146 04/05/2013   Lab Results  Component Value Date   NA 142 04/05/2013   K 4.1 04/05/2013   CO2 28 04/05/2013   Lab Results  Component Value Date   ALT 9 04/05/2013   AST 13 04/05/2013   ALKPHOS 49 04/05/2013   BILITOT 0.56 04/05/2013      NARRATIVE: Caitlin Mcneil was seen today for weekly treatment management. The chart was checked and the patient's films were reviewed. The patient is doing well. She really has not noticed any changes in the breast and far. No problems with treatment otherwise.  PHYSICAL EXAMINATION: height is 5' 3.25" (1.607 m) and weight is 175 lb 14.4 oz (79.788 kg). Her temperature is 98.2 F (36.8 C). Her blood pressure is 111/68 and her pulse is 78.      some diffuse erythema is emerging in the treatment area.  ASSESSMENT: The patient is doing satisfactorily with treatment.  PLAN: We will continue with the patient's radiation treatment as planned.

## 2013-06-19 NOTE — Addendum Note (Signed)
Encounter addended by: Delynn Flavin, RN on: 06/19/2013 12:32 PM<BR>     Documentation filed: Inpatient MAR, Orders

## 2013-06-19 NOTE — Progress Notes (Signed)
Ms. Bockrath has received 12 fractions to her right breast.  Note erythema on breast and in right axilla.  Skin remains soft and intact.  Using Radiaplex Gel BID.  She denies any fatigue 4 but notes weight gain since her activity level is not the same.

## 2013-06-20 ENCOUNTER — Ambulatory Visit: Payer: BC Managed Care – PPO

## 2013-06-20 ENCOUNTER — Ambulatory Visit
Admission: RE | Admit: 2013-06-20 | Discharge: 2013-06-20 | Disposition: A | Discharge status: Home / Self Care | Payer: BC Managed Care – PPO | Source: Ambulatory Visit | Attending: Radiation Oncology | Admitting: Radiation Oncology

## 2013-06-21 ENCOUNTER — Ambulatory Visit: Payer: BC Managed Care – PPO

## 2013-06-21 ENCOUNTER — Ambulatory Visit
Admission: RE | Admit: 2013-06-21 | Discharge: 2013-06-21 | Disposition: A | Discharge status: Home / Self Care | Payer: BC Managed Care – PPO | Source: Ambulatory Visit | Attending: Radiation Oncology | Admitting: Radiation Oncology

## 2013-06-21 ENCOUNTER — Encounter: Payer: Self-pay | Admitting: Genetic Counselor

## 2013-06-21 ENCOUNTER — Telehealth: Payer: Self-pay | Admitting: Genetic Counselor

## 2013-06-21 NOTE — Telephone Encounter (Signed)
Revealed negative genetic testing on the breast/ovarian cancer panel.   

## 2013-06-26 ENCOUNTER — Ambulatory Visit
Admission: RE | Admit: 2013-06-26 | Discharge: 2013-06-26 | Disposition: A | Discharge status: Home / Self Care | Payer: BC Managed Care – PPO | Source: Ambulatory Visit | Attending: Radiation Oncology | Admitting: Radiation Oncology

## 2013-06-27 ENCOUNTER — Ambulatory Visit
Admission: RE | Admit: 2013-06-27 | Discharge: 2013-06-27 | Disposition: A | Discharge status: Home / Self Care | Payer: BC Managed Care – PPO | Source: Ambulatory Visit | Attending: Radiation Oncology | Admitting: Radiation Oncology

## 2013-06-28 ENCOUNTER — Ambulatory Visit
Admission: RE | Admit: 2013-06-28 | Discharge: 2013-06-28 | Disposition: A | Discharge status: Home / Self Care | Payer: BC Managed Care – PPO | Source: Ambulatory Visit | Attending: Radiation Oncology | Admitting: Radiation Oncology

## 2013-06-29 ENCOUNTER — Encounter: Payer: Self-pay | Admitting: Radiation Oncology

## 2013-06-29 ENCOUNTER — Ambulatory Visit
Admission: RE | Admit: 2013-06-29 | Discharge: 2013-06-29 | Disposition: A | Discharge status: Home / Self Care | Payer: BC Managed Care – PPO | Source: Ambulatory Visit | Attending: Radiation Oncology | Admitting: Radiation Oncology

## 2013-06-29 VITALS — BP 119/78 | HR 88 | Temp 98.6°F | Resp 20 | Wt 177.0 lb

## 2013-06-29 DIAGNOSIS — C50511 Malignant neoplasm of lower-outer quadrant of right female breast: Secondary | ICD-10-CM

## 2013-06-29 NOTE — Progress Notes (Signed)
Weekly trad txs rt breast, slight erythema,skin intact, no c/o pain  No fatigue 10:54 AM

## 2013-06-29 NOTE — Progress Notes (Signed)
   Department of Radiation Oncology  Phone:  [phone] Fax:        [phone]  Weekly Treatment Note    Name: [name] Date: 06/29/2013 MRN: [id number] DOB: Jul 18, 1956   Current dose: 32.4 Gy  Current fraction: 18   MEDICATIONS: Current Outpatient Prescriptions  Medication Sig Dispense Refill  . Acetaminophen (TYLENOL ARTHRITIS PAIN PO) Take 2 tablets by mouth 2 (two) times daily.      . cholecalciferol (VITAMIN D) 1000 UNITS tablet Take 1,000 Units by mouth daily.      . clonazePAM (KLONOPIN) 0.5 MG tablet Take 0.5 mg by mouth 2 (two) times daily as needed for anxiety.      . hyaluronate sodium (RADIAPLEXRX) GEL Apply 1 application topically 2 (two) times daily. Apply to affected area after rad txs and bedtime      . Loperamide HCl (IMODIUM PO) Take 2 tablets by mouth daily as needed.       . non-metallic deodorant Thornton Papas) MISC Apply 1 application topically daily as needed. Apply daily after rad txs      . pregabalin (LYRICA) 100 MG capsule Take 100 mg by mouth 2 (two) times daily.      . pseudoephedrine (SUDAFED) 30 MG tablet Take 30 mg by mouth every 4 (four) hours as needed for congestion.      . temazepam (RESTORIL) 15 MG capsule Take 30 mg by mouth at bedtime as needed for sleep.      . ziprasidone (GEODON) 40 MG capsule Take 40 mg by mouth daily with supper.      . zolpidem (AMBIEN CR) 12.5 MG CR tablet Take 12.5 mg by mouth at bedtime as needed for sleep.       No current facility-administered medications for this encounter.     ALLERGIES: Penicillins and Aspirin   LABORATORY DATA:  Lab Results  Component Value Date   WBC 8.6 04/05/2013   HGB 14.3 05/02/2013   HCT 39.4 04/05/2013   MCV 91.6 04/05/2013   PLT 146 04/05/2013   Lab Results  Component Value Date   NA 142 04/05/2013   K 4.1 04/05/2013   CO2 28 04/05/2013   Lab Results  Component Value Date   ALT 9 04/05/2013   AST 13 04/05/2013   ALKPHOS 49 04/05/2013   BILITOT 0.56 04/05/2013      NARRATIVE: Caitlin Mcneil was seen today for weekly treatment management. The chart was checked and the patient's films were reviewed. The patient is doing well. No complaints of pain at this time.  PHYSICAL EXAMINATION: weight is 177 lb (80.287 kg). Her oral temperature is 98.6 F (37 C). Her blood pressure is 119/78 and her pulse is 88. Her respiration is 20.      slight erythema in the treatment area. Overall her skin looks good.  ASSESSMENT: The patient is doing satisfactorily with treatment.  PLAN: We will continue with the patient's radiation treatment as planned.

## 2013-06-30 ENCOUNTER — Ambulatory Visit
Admission: RE | Admit: 2013-06-30 | Discharge: 2013-06-30 | Disposition: A | Discharge status: Home / Self Care | Payer: BC Managed Care – PPO | Source: Ambulatory Visit | Attending: Radiation Oncology | Admitting: Radiation Oncology

## 2013-07-03 ENCOUNTER — Ambulatory Visit
Admission: RE | Admit: 2013-07-03 | Discharge: 2013-07-03 | Disposition: A | Discharge status: Home / Self Care | Payer: BC Managed Care – PPO | Source: Ambulatory Visit | Attending: Radiation Oncology | Admitting: Radiation Oncology

## 2013-07-04 ENCOUNTER — Ambulatory Visit
Admission: RE | Admit: 2013-07-04 | Discharge: 2013-07-04 | Disposition: A | Discharge status: Home / Self Care | Payer: BC Managed Care – PPO | Source: Ambulatory Visit | Attending: Radiation Oncology | Admitting: Radiation Oncology

## 2013-07-05 ENCOUNTER — Ambulatory Visit
Admission: RE | Admit: 2013-07-05 | Discharge: 2013-07-05 | Disposition: A | Discharge status: Home / Self Care | Payer: BC Managed Care – PPO | Source: Ambulatory Visit | Attending: Radiation Oncology | Admitting: Radiation Oncology

## 2013-07-06 ENCOUNTER — Ambulatory Visit
Admission: RE | Admit: 2013-07-06 | Discharge: 2013-07-06 | Disposition: A | Discharge status: Home / Self Care | Payer: BC Managed Care – PPO | Source: Ambulatory Visit | Attending: Radiation Oncology | Admitting: Radiation Oncology

## 2013-07-06 DIAGNOSIS — C50511 Malignant neoplasm of lower-outer quadrant of right female breast: Secondary | ICD-10-CM

## 2013-07-07 ENCOUNTER — Ambulatory Visit
Admission: RE | Admit: 2013-07-07 | Discharge: 2013-07-07 | Disposition: A | Discharge status: Home / Self Care | Payer: BC Managed Care – PPO | Source: Ambulatory Visit | Attending: Radiation Oncology | Admitting: Radiation Oncology

## 2013-07-07 ENCOUNTER — Encounter: Payer: Self-pay | Admitting: Radiation Oncology

## 2013-07-07 VITALS — BP 117/78 | HR 87 | Temp 97.8°F | Resp 20 | Wt 180.0 lb

## 2013-07-07 DIAGNOSIS — C50511 Malignant neoplasm of lower-outer quadrant of right female breast: Secondary | ICD-10-CM

## 2013-07-07 MED ORDER — RADIAPLEXRX EX GEL
Freq: Once | CUTANEOUS | Status: AC
  Administered 2013-07-07: 11:00:00 via TOPICAL

## 2013-07-07 NOTE — Progress Notes (Signed)
   Department of Radiation Oncology  Phone:  [phone] Fax:        [phone]  Weekly Treatment Note    Name: Caitlin Mcneil Date: 07/07/2013 MRN: [id number] DOB: 1956-07-12   Current dose: 43.2 Gy  Current fraction: 24   MEDICATIONS: Current Outpatient Prescriptions  Medication Sig Dispense Refill  . Acetaminophen (TYLENOL ARTHRITIS PAIN PO) Take 2 tablets by mouth 2 (two) times daily.      . cholecalciferol (VITAMIN D) 1000 UNITS tablet Take 1,000 Units by mouth daily.      . clonazePAM (KLONOPIN) 0.5 MG tablet Take 0.5 mg by mouth 2 (two) times daily as needed for anxiety.      . hyaluronate sodium (RADIAPLEXRX) GEL Apply 1 application topically 2 (two) times daily. Apply to affected area after rad txs and bedtime      . Loperamide HCl (IMODIUM PO) Take 2 tablets by mouth daily as needed.       . non-metallic deodorant Thornton Papas) MISC Apply 1 application topically daily as needed. Apply daily after rad txs      . pregabalin (LYRICA) 100 MG capsule Take 100 mg by mouth 2 (two) times daily.      . pseudoephedrine (SUDAFED) 30 MG tablet Take 30 mg by mouth every 4 (four) hours as needed for congestion.      . temazepam (RESTORIL) 15 MG capsule Take 30 mg by mouth at bedtime as needed for sleep.      . ziprasidone (GEODON) 40 MG capsule Take 40 mg by mouth daily with supper.      . zolpidem (AMBIEN CR) 12.5 MG CR tablet Take 12.5 mg by mouth at bedtime as needed for sleep.       No current facility-administered medications for this encounter.     ALLERGIES: Penicillins and Aspirin   LABORATORY DATA:  Lab Results  Component Value Date   WBC 8.6 04/05/2013   HGB 14.3 05/02/2013   HCT 39.4 04/05/2013   MCV 91.6 04/05/2013   PLT 146 04/05/2013   Lab Results  Component Value Date   NA 142 04/05/2013   K 4.1 04/05/2013   CO2 28 04/05/2013   Lab Results  Component Value Date   ALT 9 04/05/2013   AST 13 04/05/2013   ALKPHOS 49 04/05/2013   BILITOT 0.56 04/05/2013      NARRATIVE: Caitlin Mcneil was seen today for weekly treatment management. The chart was checked and the patient's films were reviewed. The patient states that she is doing well. She has noticed a little bit of redness emerging in the treatment area. No pain.  PHYSICAL EXAMINATION: weight is 180 lb (81.647 kg). Her oral temperature is 97.8 F (36.6 C). Her blood pressure is 117/78 and her pulse is 87. Her respiration is 20.      diffuse erythema/radiation dermatitis emerging which is mild for where she is in her treatment.  ASSESSMENT: The patient is doing satisfactorily with treatment.  PLAN: We will continue with the patient's radiation treatment as planned.

## 2013-07-07 NOTE — Progress Notes (Signed)
Weekly rad txs, 24/33 right breast erythema only,skin intact, radiaplex bid, itchy some, no pain 10:45 AM

## 2013-07-07 NOTE — Progress Notes (Signed)
  Radiation Oncology         [phone]) [phone] ________________________________  Name: Caitlin Mcneil MRN: [id number]  Date: 07/06/2013  DOB: 1955/11/25  Complex simulation note  The patient has undergone complex simulation for her upcoming boost treatment for her diagnosis of breast cancer. The patient has initially been planned to receive 50.4 Gy. The patient will now receive a 10 Gy boost to the seroma cavity which has been contoured. This will be accomplished using an en face electron field. Based on the depth of the target area, 18 MeV electrons will be used and this field has been normalized to the 95 % isodose line. The patient's final total dose therefore will be 60.4 Gy. A special port plan is requested for the boost treatment.   _______________________________  Radene Gunning, MD, PhD

## 2013-07-10 ENCOUNTER — Ambulatory Visit
Admission: RE | Admit: 2013-07-10 | Discharge: 2013-07-10 | Disposition: A | Discharge status: Home / Self Care | Payer: BC Managed Care – PPO | Source: Ambulatory Visit | Attending: Radiation Oncology | Admitting: Radiation Oncology

## 2013-07-11 ENCOUNTER — Ambulatory Visit
Admission: RE | Admit: 2013-07-11 | Discharge: 2013-07-11 | Disposition: A | Discharge status: Home / Self Care | Payer: BC Managed Care – PPO | Source: Ambulatory Visit | Attending: Radiation Oncology | Admitting: Radiation Oncology

## 2013-07-12 ENCOUNTER — Ambulatory Visit
Admission: RE | Admit: 2013-07-12 | Discharge: 2013-07-12 | Disposition: A | Discharge status: Home / Self Care | Payer: BC Managed Care – PPO | Source: Ambulatory Visit | Attending: Radiation Oncology | Admitting: Radiation Oncology

## 2013-07-13 ENCOUNTER — Ambulatory Visit
Admission: RE | Admit: 2013-07-13 | Discharge: 2013-07-13 | Disposition: A | Discharge status: Home / Self Care | Payer: BC Managed Care – PPO | Source: Ambulatory Visit | Attending: Radiation Oncology | Admitting: Radiation Oncology

## 2013-07-14 ENCOUNTER — Ambulatory Visit
Admission: RE | Admit: 2013-07-14 | Discharge: 2013-07-14 | Disposition: A | Discharge status: Home / Self Care | Payer: BC Managed Care – PPO | Source: Ambulatory Visit | Attending: Radiation Oncology | Admitting: Radiation Oncology

## 2013-07-14 VITALS — BP 123/87 | HR 78 | Temp 98.0°F | Ht 63.0 in | Wt 180.5 lb

## 2013-07-14 DIAGNOSIS — C50511 Malignant neoplasm of lower-outer quadrant of right female breast: Secondary | ICD-10-CM

## 2013-07-14 NOTE — Progress Notes (Signed)
   Department of Radiation Oncology  Phone:  [phone] Fax:        [phone]  Weekly Treatment Note    Name: Caitlin Mcneil Date: 07/14/2013 MRN: [id number] DOB: 03/19/1956   Current dose: 52.4 Gy  Current fraction: 29   MEDICATIONS: Current Outpatient Prescriptions  Medication Sig Dispense Refill  . Acetaminophen (TYLENOL ARTHRITIS PAIN PO) Take 2 tablets by mouth 2 (two) times daily.      . cholecalciferol (VITAMIN D) 1000 UNITS tablet Take 1,000 Units by mouth daily.      . clonazePAM (KLONOPIN) 0.5 MG tablet Take 0.5 mg by mouth 2 (two) times daily as needed for anxiety.      . hyaluronate sodium (RADIAPLEXRX) GEL Apply 1 application topically 2 (two) times daily. Apply to affected area after rad txs and bedtime      . Loperamide HCl (IMODIUM PO) Take 2 tablets by mouth daily as needed.       . non-metallic deodorant Thornton Papas) MISC Apply 1 application topically daily as needed. Apply daily after rad txs      . pregabalin (LYRICA) 100 MG capsule Take 100 mg by mouth 2 (two) times daily.      . temazepam (RESTORIL) 15 MG capsule Take 30 mg by mouth at bedtime as needed for sleep.      . ziprasidone (GEODON) 40 MG capsule Take 40 mg by mouth daily with supper.      . zolpidem (AMBIEN CR) 12.5 MG CR tablet Take 12.5 mg by mouth at bedtime as needed for sleep.      . pseudoephedrine (SUDAFED) 30 MG tablet Take 30 mg by mouth every 4 (four) hours as needed for congestion.       No current facility-administered medications for this encounter.     ALLERGIES: Penicillins and Aspirin   LABORATORY DATA:  Lab Results  Component Value Date   WBC 8.6 04/05/2013   HGB 14.3 05/02/2013   HCT 39.4 04/05/2013   MCV 91.6 04/05/2013   PLT 146 04/05/2013   Lab Results  Component Value Date   NA 142 04/05/2013   K 4.1 04/05/2013   CO2 28 04/05/2013   Lab Results  Component Value Date   ALT 9 04/05/2013   AST 13 04/05/2013   ALKPHOS 49 04/05/2013   BILITOT 0.56 04/05/2013      NARRATIVE: Caitlin Mcneil was seen today for weekly treatment management. The chart was checked and the patient's films were reviewed. The patient states that she is doing very well. She denies any pain or fatigue. Her skin she states really has not been bothering her.  PHYSICAL EXAMINATION: height is 5\' 3"  (1.6 m) and weight is 180 lb 8 oz (81.874 kg). Her temperature is 98 F (36.7 C). Her blood pressure is 123/87 and her pulse is 78.      some diffuse erythema present in the treatment area. No desquamation. Overall her skin looks excellent.  ASSESSMENT: The patient is doing satisfactorily with treatment.  PLAN: We will continue with the patient's radiation treatment as planned. The patient will continue her skin care.

## 2013-07-14 NOTE — Progress Notes (Signed)
Caitlin Mcneil has had 29 fractions to her right breast.  She denies pain and fatigue.  The skin on her right breast is red.  She said it is occasional itchy.  She is using radiaplex gel.

## 2013-07-17 ENCOUNTER — Ambulatory Visit
Admission: RE | Admit: 2013-07-17 | Discharge: 2013-07-17 | Disposition: A | Discharge status: Home / Self Care | Payer: BC Managed Care – PPO | Source: Ambulatory Visit | Attending: Radiation Oncology | Admitting: Radiation Oncology

## 2013-07-18 ENCOUNTER — Ambulatory Visit
Admission: RE | Admit: 2013-07-18 | Discharge: 2013-07-18 | Disposition: A | Discharge status: Home / Self Care | Payer: BC Managed Care – PPO | Source: Ambulatory Visit | Attending: Radiation Oncology | Admitting: Radiation Oncology

## 2013-07-19 ENCOUNTER — Ambulatory Visit
Admission: RE | Admit: 2013-07-19 | Discharge: 2013-07-19 | Disposition: A | Discharge status: Home / Self Care | Payer: BC Managed Care – PPO | Source: Ambulatory Visit | Attending: Radiation Oncology | Admitting: Radiation Oncology

## 2013-07-21 ENCOUNTER — Ambulatory Visit
Admission: RE | Admit: 2013-07-21 | Discharge: 2013-07-21 | Disposition: A | Discharge status: Home / Self Care | Payer: BC Managed Care – PPO | Source: Ambulatory Visit | Attending: Radiation Oncology | Admitting: Radiation Oncology

## 2013-07-21 ENCOUNTER — Encounter: Payer: Self-pay | Admitting: Radiation Oncology

## 2013-07-21 ENCOUNTER — Ambulatory Visit: Payer: BC Managed Care – PPO

## 2013-07-21 VITALS — BP 130/86 | HR 80 | Temp 98.5°F | Resp 20 | Wt 182.6 lb

## 2013-07-21 DIAGNOSIS — C50511 Malignant neoplasm of lower-outer quadrant of right female breast: Secondary | ICD-10-CM

## 2013-07-21 NOTE — Progress Notes (Signed)
  Radiation Oncology         [phone] ________________________________  Name: Caitlin Mcneil MRN: [id number]  Date: 07/21/2013  DOB: September 19, 1955  Weekly Radiation Therapy Management  Current Dose: 60.4 Gy     Planned Dose:  60.4 Gy  Narrative . . . . . . . . The patient presents for the final under treatment assessment.                                          The patient has had some continuation of previously noted symptoms using radiaplex gel bid, no c/o                                 Set-up films were reviewed.                                 The chart was checked. Physical Findings. . . Weight essentially stable.  dry peeling under axilla, erythema elsewhere on breast No significant changes. Impression . . . . . . . The patient tolerated radiation relatively well. Plan . . . . . . . . . . . . Complete radiation today as scheduled, and follow-up in one month. The patient was encouraged to call or return to the clinic in the interim for any worsening symptoms.  ________________________________  Artist Pais Kathrynn Running, M.D.

## 2013-07-21 NOTE — Progress Notes (Signed)
wekly rad txs rt breast, 33/33, dry peeling under axilla, erythema elsewhere on breast, using radiaplex gel bid, no c/o . 1 month f/u appt card  given  11:55 AM

## 2013-07-24 ENCOUNTER — Ambulatory Visit: Payer: BC Managed Care – PPO

## 2013-07-30 NOTE — Progress Notes (Signed)
  Radiation Oncology         [phone]) [phone] ________________________________  Name: Caitlin Mcneil MRN: [id number]  Date: 07/21/2013  DOB: [birth year]  End of Treatment Note  Diagnosis:   Invasive ductal carcinoma of the right breast     Indication for treatment:  Curative       Radiation treatment dates:   06/05/2013 through 07/21/2013  Site/dose:   She initially was treated to the right breast using whole breast tangent fields. In this manner the patient was treated to 50.4 gray at 1.8 gray per fraction. The patient then received a boost using an en face electron field for an additional 10 gray at 2 gray per fraction using 18 MeV electrons. The patient's final dose was 60.4 gray.   Narrative: The patient tolerated radiation treatment relatively well.   The patient had moderate skin irritation during the course of her treatment. The patient's skin is expected to heal well within several weeks after completing her course of radiation treatment.  Plan: The patient has completed radiation treatment. The patient will return to radiation oncology clinic for routine followup in one month. I advised the patient to call or return sooner if they have any questions or concerns related to their recovery or treatment. ________________________________  Jodelle Gross, M.D., Ph.D.

## 2013-08-07 ENCOUNTER — Ambulatory Visit (HOSPITAL_BASED_OUTPATIENT_CLINIC_OR_DEPARTMENT_OTHER): Payer: BC Managed Care – PPO | Admitting: Oncology

## 2013-08-07 ENCOUNTER — Telehealth: Payer: Self-pay | Admitting: Oncology

## 2013-08-07 ENCOUNTER — Encounter: Payer: Self-pay | Admitting: Oncology

## 2013-08-07 VITALS — BP 128/82 | HR 81 | Temp 98.0°F | Resp 20 | Ht 63.0 in | Wt 182.7 lb

## 2013-08-07 DIAGNOSIS — D0590 Unspecified type of carcinoma in situ of unspecified breast: Secondary | ICD-10-CM

## 2013-08-07 DIAGNOSIS — C50511 Malignant neoplasm of lower-outer quadrant of right female breast: Secondary | ICD-10-CM

## 2013-08-07 DIAGNOSIS — Z171 Estrogen receptor negative status [ER-]: Secondary | ICD-10-CM

## 2013-08-07 NOTE — Patient Instructions (Signed)
Breast Cancer Survivor Follow-Up Breast cancer begins when cells in the breast divide too rapidly. The extra cells form a lump (tumor). When the cancer is treated, the goal is to get rid of all cancer cells. However, sometimes a few cells survive. These cancer cells can then grow. They become recurrent cancer. This means the cancer comes back after treatment.  Most cases of recurrent breast cancer develop 3 to 5 years after treatment. However, sometimes it comes back just a few months after treatment. Other times, it does not come back until years later. If the cancer comes back in the same area as the first breast cancer, it is called a local recurrence. If the cancer comes back somewhere else in the body, it is called regional recurrence if the site is fairly near the breast or distant recurrence if it is far from the breast. Your caregiver may also use the term metastasize to indicate a cancer that has gone to another part of your body. Treatment is still possible after either kind of recurrence. The cancer can still be controlled.  CAUSES OF RECURRENT CANCER No one knows exactly why breast cancer starts in the first place. Why the cancer comes back after treatment is also not clear. It is known that certain conditions, called risk factors, can make this more likely. They include:  Developing breast cancer for the first time before age 60.  Having breast cancer that involves the lymph nodes. These are small, round pieces of tissue found all over the body. Their job is to help fight infections.  Having a large tumor. Cancer is more apt to come back if the first tumor was bigger than 2 inches (5 cm).  Having certain types of breast cancer, such as:  Inflammatory breast cancer. This rare type grows rapidly and causes the breast to become red and swollen.  A high-grade tumor. The grade of a tumor indicates how fast it will grow and spread. High-grade tumors grow more quickly than other types.  HER2  cancer. This refers to the tumor's genetic makeup. Tumors that have this type of gene are more likely to come back after treatment.  Having close tumor margins. This refers to the space between the tumor and normal, noncancerous cells. If the space is small, the tumor has a greater chance of coming back.  Having treatment involving a surgery to remove the tumor but not the entire breast (lumpectomy) and no radiation therapy. CARE AFTER BREAST CANCER Home Monitoring Women who have had breast cancer should continue to examine their breasts every month. The goal is to catch the cancer quickly if it comes back. Many women find it helpful to do so on the same day each month and to mark the calendar as a reminder. Let your caregiver know immediately if you have any signs of recurrent breast cancer. Symptoms will vary, depending on where the cancer recurs. The original type of treatment can also make a difference. Symptoms of local recurrence after a lumpectomy or a recurrence in the opposite breast may include:  A new lump or thickening in the breast.  A change in the way the skin looks on the breast (such as a rash, dimpling, or wrinkling).  Redness or swelling of the breast.  Changes in the nipple (such as being red, puckered, swollen, or leaking fluid). Symptoms of a recurrence after a breast removal surgery (mastectomy) may include:  A lump or thickening under the skin.  A thickening around the mastectomy scar. Symptoms   of regional recurrence in the lymph nodes near the breast may include:  A lump under the arm or above the collarbone.  Swelling of the arm.  Pain in the arm, shoulder, or chest.  Numbness in the hand or arm. Symptoms of distant recurrence may include:  A cough that does not go away.  Trouble breathing or shortness of breath.  Pain in the bones or the chest. This is pain that lasts or does not respond to rest and medicine.  Headaches.  Sudden vision  problems.  Dizziness.  Nausea or vomiting.  Losing weight without trying to.  Persistent abdominal pain.  Changes in bowel movements or blood in the stool.  Yellowing of the skin or eyes (jaundice).  Blood in the urine or bloody vaginal discharge. Clinical Monitoring  It is helpful to keep a schedule of appointments for needed tests and exams. This includes physical exams, breast exams, exams of the lymph nodes, and general exams.  For the first 3 years after being treated for breast cancer, see your caregiver every 3 to 6 months.  For years 4 and 5 after breast cancer, see your caregiver every 6 to 12 months.  After 5 years, see your caregiver at least once a year.  Regular breast X-rays (mammograms) should continue even if you had a mastectomy.  A mammogram should be done 1 year after the mammogram that first detected breast cancer.  A mammogram should be done every 6 to 12 months after that. Follow your caregiver's advice.  A pelvic exam done by your caregiver checks whether female organs are the normal size and shape. The exam is usually done every year. Ask your caregiver if that schedule is right for you.  Women taking tamoxifen should report any vaginal bleeding immediately to their caregiver. Tamoxifen is often given to women with a certain type of breast cancer. It has been shown to help prevent recurrence.  You will need to decide who your primary caregiver will be.  Most people continue to see their cancer specialist (oncologist) every 3 to 6 months for the first year after cancer treatment.  At some point, you may want to go back to seeing your family caregiver. You would no longer see your oncologist for regular checkups. Many women do this about 1 year after their first diagnosis of breast cancer.  You will still need to be seen every so often by your oncologist. Ask how often that should be. Coordinate this with your family or primary caregiver.  Think about  having genetic counseling. This would provide information on traits that can be passed or inherited from one generation to the next. In some cases, breast cancer runs in families. Tell your caregiver if you:  Are of Ashkenazi Jewish heritage.  Have any family member who has had ovarian cancer.  Have a mother, sister, or daughter who had breast cancer before age 7.  Have 2 or more close relatives who have had breast cancer. This means a mother, sister, daughter, aunt, or grandmother.  Had breast cancer in both breasts.  Have a female relative who has had breast cancer.  Some tests are not recommended for routine screening. Someone recovering from breast cancer does not need to have these tests if there are no problems. The tests have risks, such as radiation exposure, and can be costly. The risks of these tests are thought to be greater than the benefits:  Blood tests.  Chest X-rays.  Bone scans.  Liver ultrasound.  Computed tomography (CT scan).  Positron emission tomography (PET scan).  Magnetic resonance imaging (MRI scan). DIAGNOSIS OF RECURRENT CANCER Recurrent breast cancer may be suspected for various reasons. A mammogram may not look normal. You might feel a lump or have other symptoms. Your caregiver may find something unusual during an exam. To be sure, your caregiver will probably order some tests. The tests are needed because there are symptoms or hints of a problem. They could include:  Blood tests, including a test to check how well the liver is working. The liver is a common site for a distant cancer recurrence.  Imaging tests that create pictures of the inside of the body. These tests include:  Chest X-rays to show if the cancer has come back in the lungs.  CT scans to create detailed pictures of various areas of the body and help find a distant recurrence.  MRI scans to find anything unusual in the breast, chest, or lymph nodes.  Breast ultrasound tests to  examine the breasts.  Bone scans to create a picture of your whole skeleton and find cancer in bony areas.  PET scans to create an image of the whole body. PET scans can be used together with CT scans to show more detail.  Biopsy. A small sample of tissue is taken and checked under a microscope. If cancer cells are found, they may be tested to see if they contain the HER2 gene or the hormones estrogen and progesterone. This will help your caregiver decide how to treat the recurrent cancer. TREATMENT  How recurrent breast cancer is treated depends on where the new cancer is found. The type of treatment that was used for the first breast cancer makes a difference, too. A combination of treatments may be used. Options include:  Surgery.  If the cancer comes back in the breast that was not treated before, you may need a lumpectomy or mastectomy.  If the cancer comes back in the breast that was treated before, you may need a mastectomy.  The lymph nodes under the arm may need to be removed.  Radiation therapy.  For a local recurrence, radiation may be used if it was not used during the first treatment.  For a distance recurrence, radiation is sometimes used.  Chemotherapy.  This may be used before surgery to treat recurrent breast cancer.  This may be used to treat recurrent cancer that cannot be treated with surgery.  This may be used to treat a distant recurrence.  Hormone therapy.  Women with the HER2 gene may be given hormone therapy to attack this gene. Document Released: 03/11/2011 Document Revised: 10/05/2011 Document Reviewed: 03/11/2011 [employer] Patient Information 2014 [employer]  

## 2013-08-07 NOTE — Progress Notes (Signed)
Caitlin Mcneil [id number] 1956/06/28 58 y.o. 08/07/2013 11:47 AM  CC  Caitlin Fus, MD [address] [place] [address] Dr. Erroll Luna Dr. 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 Patient was seen in the multidisciplinary breast clinic for discussion of treatment options.  STAGE:   Breast cancer of lower-outer quadrant of right female breast   Primary site: Breast (Right)   Staging method: AJCC 7th Edition   Clinical: Stage 0 (Tis (DCIS), N0, cM0)   Summary: Stage 0 (Tis (DCIS), N0, cM0) Pathologic staging: T1 N0(stage I)  REFERRING PHYSICIAN: Dr. Marcello Moores Cornett  HISTORY OF PRESENT ILLNESS:  Caitlin Mcneil is a 58 y.o. female.  With bipolar disorder and neuropathy.   #1 Patient had a screening mammogram performed that showed calcifications in the right breast. She had magnification views of the right breast that showed 2 x 5 mm of the calcifications within the lower outer quadrant. A circumscribed oval mass was also present in the upper left breast representing a benign or nipple cyst on ultrasound. 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  #2 Patient had an MRI scan attempted but it was not performed due to claustrophobia.  Her pathology and radiology were reviewed at the multidisciplinary breast conference. She is now seen in the multidisciplinary breast clinic for discussion of treatment options.  #3 patient is now status post right lumpectomy with sentinel lymph node biopsy on 05/02/2013. Pathology did reveal a T1 N0 disease. The invasive component was 0.35 cm. Intermediate grade. 3 sentinel nodes were negative. Tumor was triple negative.   #4 status post radiation therapy to the right breast administered 06/06/2013 through  07/21/2013  Interval history: Patient is seen in followup visit today. She has now completed all of her radiation therapy. She is healing quite nicely. She has very little redness in her right breast. She denies any headaches double vision blurring of vision fevers chills night sweats no shortness of breath chest pains or palpitations. Remainder of the 10 point review of systems is negative.  Current therapy: Observation  Past Medical History: Past Medical History  Diagnosis Date  . Bipolar 1 disorder   . Anxiety   . Depression   . Contact lens/glasses fitting     wears contacts or glasses  . Breast cancer 03/30/13    right breast    Past Surgical History: Past Surgical History  Procedure Laterality Date  . Foot surgery  2002    lt hammer toe-reconstruction  . Abdominal hysterectomy  1996  . Wisdom tooth extraction    . Hardware removal  2002    lt  foot hardware out  . Breast lumpectomy with needle localization and axillary sentinel lymph node bx Right 05/02/2013    Procedure: BREAST LUMPECTOMY WITH NEEDLE LOCALIZATION AND AXILLARY SENTINEL LYMPH NODE BX;  Surgeon: Joyice Faster. Cornett, MD;  Location: [facility];  Service: General;  Laterality: Right;  . Breast biopsy Right 03/30/13    Ductal carcinoma in situ/calcifications,loq    Family History: Family History  Problem Relation Age of Onset  . Colon cancer Father 6  . Alzheimer's disease Mother   . Alzheimer's disease Maternal Grandmother   . Cancer Maternal Grandfather     Cancer - NOS  . Breast cancer Other  maternal great aunt    Social History History  Substance Use Topics  . Smoking status: Never Smoker   . Smokeless tobacco: Never Used  . Alcohol Use: No    Allergies: Allergies  Allergen Reactions  . Penicillins Anaphylaxis  . Aspirin Rash    Current Medications: Current Outpatient Prescriptions  Medication Sig Dispense Refill  . Acetaminophen (TYLENOL ARTHRITIS PAIN PO) Take 2  tablets by mouth 2 (two) times daily.      . cholecalciferol (VITAMIN D) 1000 UNITS tablet Take 1,000 Units by mouth daily.      . clonazePAM (KLONOPIN) 0.5 MG tablet Take 0.5 mg by mouth 2 (two) times daily as needed for anxiety.      . Loperamide HCl (IMODIUM PO) Take 2 tablets by mouth daily as needed.       . pregabalin (LYRICA) 100 MG capsule Take 100 mg by mouth 2 (two) times daily.      . pseudoephedrine (SUDAFED) 30 MG tablet Take 30 mg by mouth every 4 (four) hours as needed for congestion.      . temazepam (RESTORIL) 15 MG capsule Take 30 mg by mouth at bedtime as needed for sleep.      . ziprasidone (GEODON) 40 MG capsule Take 40 mg by mouth daily with supper.      . zolpidem (AMBIEN CR) 12.5 MG CR tablet Take 12.5 mg by mouth at bedtime as needed for sleep.       No current facility-administered medications for this visit.    OB/GYN History:menarche at age 85 patient is postmenopausal she has been on hormone replacement therapy for 18 years. She is currently using them and I asked her to discontinue. Patient's first live birth was at 59.  Fertility Discussion:not applicable Prior History of Cancer:no prior  Health Maintenance:  Colonoscopyjust 2007 Bone Density2013 Last PAP smearup to date  ECOG PERFORMANCE STATUS: 1 - Symptomatic but completely ambulatory  Genetic Counseling/testing:no  REVIEW OF SYSTEMS: Comprehensive review of systems was obtained. It was significant for anxiety depression phobia as bruising easily as well as arthritis. Patient also complains of fatigue pain in her feet. Remainder of the 14 point review of systems was negative.  PHYSICAL EXAMINATION: Blood pressure 128/82, pulse 81, temperature 98 F (36.7 C), temperature source Oral, resp. rate 20, height _0  (1.6 m), weight 182 lb 11.2 oz (82.872 kg).  DTO:IZTIW, healthy, no distress, well nourished, well developed and anxious SKIN: skin color, texture, turgor are normal HEAD:  Normocephalic EYES: PERRLA, EOMI, Conjunctiva are pink and non-injected EARS: External ears normal OROPHARYNX:no exudate, no erythema and lips, buccal mucosa, and tongue normal  NECK: no adenopathy, thyroid normal size, non-tender, without nodularity LYMPH:  no palpable lymphadenopathy, no hepatosplenomegaly BREAST:breasts appear normal, no suspicious masses, no skin or nipple changes or axillary nodes LUNGS: clear to auscultation  HEART: regular rate & rhythm ABDOMEN:abdomen soft, non-tender, obese, normal bowel sounds and no masses or organomegaly BACK: Back symmetric, no curvature., No CVA tenderness EXTREMITIES:no edema, no clubbing, no cyanosis  NEURO: alert & oriented x 3 with fluent speech, no focal motor/sensory deficits, gait normal     STUDIES/RESULTS: US Breast Left  Apr 09, 2013   *RADIOLOGY REPORT*  Clinical Data:  58 year old female with abnormal screening mammogram - right breast calcifications.  Also palpable left breast lump discovered on self examination.  DIGITAL DIAGNOSTIC BILATERAL MAMMOGRAM  AND LEFT BREAST ULTRASOUND:  Comparison:  02/23/2013 screening mammogram and prior mammograms dating back to 10/20/2007  Findings:  ACR  Breast Density Category d:  The breast tissue is extremely dense, which lowers the sensitivity of mammography.  Magnification views of the right breast demonstrate a 2 x 5 mm group of heterogeneous calcifications within the anterior lower outer right breast.  No definite associated mass is noted. There is A circumscribed oval mass within the upper left breast is present.  On physical exam, a firm palpable mobile mass is identified at the 10 o'clock position of the left breast 4 cm from the nipple.  Ultrasound is performed, showing a 1.9 x 1.6 x 1.8 cm benign simple cyst at the 10 o'clock position of the left breast 4 cm from the nipple, corresponding to the area of patient concern.  IMPRESSION: Indeterminate calcifications within the anterior lower outer  right breast - tissue sampling is recommended to exclude DCIS.  Benign simple cyst in the upper inner left breast, corresponding to the area of palpable concern.  BI-RADS CATEGORY 4:  Suspicious abnormality - biopsy should be considered.  RECOMMENDATION: Stereotactic guided right breast biopsy, which will be scheduled by our office.  I have discussed the findings and recommendations with the patient. Results were also provided in writing at the conclusion of the visit.  If applicable, a reminder letter will be sent to the patient regarding the next appointment.   Original Report Authenticated By: Margarette Canada, M.D.   Mm Digital Diagnostic Bilat  03/15/2013   *RADIOLOGY REPORT*  Clinical Data:  58 year old female with abnormal screening mammogram - right breast calcifications.  Also palpable left breast lump discovered on self examination.  DIGITAL DIAGNOSTIC BILATERAL MAMMOGRAM  AND LEFT BREAST ULTRASOUND:  Comparison:  02/23/2013 screening mammogram and prior mammograms dating back to 10/20/2007  Findings:  ACR Breast Density Category d:  The breast tissue is extremely dense, which lowers the sensitivity of mammography.  Magnification views of the right breast demonstrate a 2 x 5 mm group of heterogeneous calcifications within the anterior lower outer right breast.  No definite associated mass is noted. There is A circumscribed oval mass within the upper left breast is present.  On physical exam, a firm palpable mobile mass is identified at the 10 o'clock position of the left breast 4 cm from the nipple.  Ultrasound is performed, showing a 1.9 x 1.6 x 1.8 cm benign simple cyst at the 10 o'clock position of the left breast 4 cm from the nipple, corresponding to the area of patient concern.  IMPRESSION: Indeterminate calcifications within the anterior lower outer right breast - tissue sampling is recommended to exclude DCIS.  Benign simple cyst in the upper inner left breast, corresponding to the area of palpable  concern.  BI-RADS CATEGORY 4:  Suspicious abnormality - biopsy should be considered.  RECOMMENDATION: Stereotactic guided right breast biopsy, which will be scheduled by our office.  I have discussed the findings and recommendations with the patient. Results were also provided in writing at the conclusion of the visit.  If applicable, a reminder letter will be sent to the patient regarding the next appointment.   Original Report Authenticated By: Margarette Canada, M.D.   Mm Rt Breast Bx W Loc Dev 1st Lesion Image Bx Spec Stereo Guide  04/04/2013   **ADDENDUM** CREATED: 04/04/2013 16:26:18  The patient returned on 04/04/2013 after an unsuccessful attempt at MRI due to claustrophobia. She stated that she did not feel that she would be able to undergo an MRI of the breasts with medication. She returned due to a concern about the degree of bruising  in her right breast and a palpable hematoma.  She also requested removal of the stitch.  On physical examination, the patient has a large area of bruising involving the majority of the inferior half of the right breast. The right breast is also approximately 50% larger than the left breast.  She reported that the breasts are normally approximately symmetrical in size.  She also has an approximately 8 x 5 cm palpable hematoma in the inferior right breast, centered slightly laterally.  Post clip placement mammogram images of the right breast were obtained today.  These demonstrate a T-shaped biopsy marker clip approximately 2 cm superior and lateral to the location of the biopsied microcalcifications.  None of the targeted calcifications remain following biopsy.  There are residual loosely grouped tiny microcalcifications in that portion of the breast, 6 mm medial and inferior to the biopsy marker clip, measuring 1.2 cm in maximum diameter.  The stitch was removed from the lower outer portion of the right breast.  The wound demonstrates normal healing with no extrusion of fluid.  She  will be seen in the Multidisciplinary Clinic tomorrow.  **END ADDENDUM** SIGNED BY: Sherolyn Buba. Joneen Caraway, M.D.  03/31/2013   **ADDENDUM** CREATED: 03/31/2013 11:36:47  Addendum by Dr. Conchita Paris on 03/31/2013 at 11:36 a.m.  I spoke with the patient by telephone to discuss pathology results. Pathology demonstrates high-grade DCIS, which is concordant with the imaging appearance.  Multidisciplinary clinic is scheduled 04/05/2013.  MRI appointment will be scheduled but has not yet been confirmed.  The patient will return 04/06/2013 to the [facility] for mammograms of the right breast and removal of the stitch.  All questions were answered.  She reports no problems at the biopsy site overnight.  **END ADDENDUM** SIGNED BY: Arline Asp, M.D.  03/30/2013   *RADIOLOGY REPORT*  Clinical Data:  Suspicious right breast calcifications, lower outer quadrant  STEREOTACTIC-GUIDED VACUUM ASSISTED BIOPSY OF THE RIGHT BREAST AND SPECIMEN RADIOGRAPH  Comparison: Previous exams.  I met with the patient and we discussed the procedure of stereotactic-guided biopsy, including benefits and alternatives. We discussed the high likelihood of a successful procedure. We discussed the risks of the procedure, including infection, bleeding, tissue injury, clip migration, and inadequate sampling. Informed, written consent was given. The usual time-out protocol was performed immediately prior to the procedure.  Using sterile technique and 2% Lidocaine as local anesthetic, under stereotactic guidance, a 9 gauge vacuum-assisted device was used to perform core needle biopsy of right lower outer quadrant calcifications using a lateral to medial approach.  Specimen radiograph was performed, showing calcifications in the biopsy samples.  Specimens with calcifications are identified for pathology.  At the conclusion of the procedure, a T shaped tissue marker clip was deployed into the biopsy cavity.  Follow-up 2-view mammogram was not performed  because the patient had prolonged bleeding despite manual compression and application of hemostatic topical material.  This resolved after placement of a single stitch.  IMPRESSION: Stereotactic-guided biopsy of right lower outer quadrant calcifications with T shaped clip placement.  Pathology is pending. No apparent complications.  We will contact the patient with pathology results and she has a follow-up appointment on 04/06/2013 at 3:30 p.m. to evaluate wound healing and remove the stitch.   Original Report Authenticated By: Conchita Paris, M.D.     LABS:    Chemistry      Component Value Date/Time   NA 142 04/05/2013 1223   K 4.1 04/05/2013 1223   CO2 28 04/05/2013 1223  BUN 22.5 04/05/2013 1223   CREATININE 0.7 04/05/2013 1223      Component Value Date/Time   CALCIUM 9.1 04/05/2013 1223   ALKPHOS 49 04/05/2013 1223   AST 13 04/05/2013 1223   ALT 9 04/05/2013 1223   BILITOT 0.56 04/05/2013 1223      Lab Results  Component Value Date   WBC 8.6 04/05/2013   HGB 14.3 05/02/2013   HCT 39.4 04/05/2013   MCV 91.6 04/05/2013   PLT 146 04/05/2013   ADDITIONAL INFORMATION: 4. PROGNOSTIC INDICATORS - ACIS Results: IMMUNOHISTOCHEMICAL AND MORPHOMETRIC ANALYSIS BY THE AUTOMATED CELLULAR IMAGING SYSTEM (ACIS) Estrogen Receptor: 0%, NEGATIVE Progesterone Receptor: 0%, NEGATIVE Proliferation Marker Ki67: 14% COMMENT: The negative hormone receptor study(ies) in this case have an internal positive control. REFERENCE RANGE ESTROGEN RECEPTOR NEGATIVE <1% POSITIVE =>1% PROGESTERONE RECEPTOR NEGATIVE <1% POSITIVE =>1% All controls stained appropriately Enid Cutter MD Pathologist, Electronic Signature ( Signed 05/10/2013) 4. CHROMOGENIC IN-SITU HYBRIDIZATION Results: HER-2/NEU BY CISH - NO AMPLIFICATION OF HER-2 DETECTED. RESULT RATIO OF HER2: CEP 17 SIGNALS 1.67 1 of 4 FINAL for Caitlin Mcneil, Caitlin Mcneil [phone]) ADDITIONAL INFORMATION:(continued) AVERAGE HER2 COPY NUMBER PER CELL  2.00 REFERENCE RANGE NEGATIVE HER2/Chr17 Ratio <2.0 and Average HER2 copy number <4.0 EQUIVOCAL HER2/Chr17 Ratio <2.0 and Average HER2 copy number 4.0 and <6.0 POSITIVE HER2/Chr17 Ratio >=2.0 and/or Average HER2 copy number >=6.0 Enid Cutter MD Pathologist, Electronic Signature ( Signed 05/09/2013) FINAL DIAGNOSIS Diagnosis 1. Lymph node, sentinel, biopsy, Right axillary, #1 - THERE IS NO EVIDENCE OF CARCINOMA IN 1 OF 1 LYMPH NODE. (0/1). - SEE COMMENT. 2. Lymph node, sentinel, biopsy, Right axillary, #2 - THERE IS NO EVIDENCE OF CARCINOMA IN 1 OF 1 LYMPH NODE. (0/1). - SEE COMMENT. 3. Lymph node, sentinel, biopsy, Right axillary, #3 - THERE IS NO EVIDENCE OF CARCINOMA IN 1 OF 1 LYMPH NODE (0/1). - SEE COMMENT. 4. Breast, lumpectomy, Right - INVASIVE DUCTAL CARCINOMA, GRADE I/III, SPANNING AT LEAST 0.35 CM. - DUCTAL CARCINOMA IN SITU, HIGH GRADE. - THE SURGICAL RESECTION MARGINS ARE NEGATIVE FOR CARCINOMA. - SEE ONCOLOGY TABLE BELOW. Microscopic Comment 4. BREAST, INVASIVE TUMOR, WITH LYMPH NODE SAMPLING Specimen, including laterality and lymph node sampling (sentinel, non-sentinel): Right breast and three sentinel nodes. Procedure: Needle localized lumpectomy and sentinel lymph node resection. Histologic type: Ductal. Grade: II Tubule formation: 3 Nuclear pleomorphism: 2 Mitotic:1 Tumor size (glass slide measurement): 0.35 cm, see comment. Margins: Negative for carcinoma. Invasive, distance to closest margin: Greater than 0.2 cm to all margins (gross measurement). In-situ, distance to closest margin: Greater than 0.2 cm to all margins (gross measurement). Lymphovascular invasion: Not identified. Ductal carcinoma in situ: Present. Grade: High grade. Extensive intraductal component: Yes. Lobular neoplasia: Not identified. Tumor focality: Unifocal. 2 of 4 FINAL for DILCIA, RYBARCZYK ([id number]) Microscopic Comment(continued) Treatment effect: N/A. Extent of tumor: Tumor  confined to breast parenchyma. Lymph nodes: Examined: 3 Sentinel 0 Non-sentinel 3 Total Lymph nodes with metastasis: 0 Breast prognostic profile: Will be performed on the invasive component of the current case and results reported separately. Non-neoplastic breast: Healing biopsy site. TNM: pT1a, pN0 Comments: The invasive tumor cells are positive for cytokeratin AE1/AE3 and E-Cadherin, supporting the above diagnosis. The invasive component spans at least 0.35 cm, but is present at a non-margin tissue edge, and therefore the span may be greater. Immunohistochemical stains performed on parts 1-3 fail to highlight the presence of cytokeratin positive tumor cells. 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 Patient is also found to have a simple cyst in the right side. Patient was seen in the [facility] clinic for discussion of treatment options. Patient underwent lumpectomy with sentinel lymph node biopsy.  #2 patient is now status post lumpectomy with sentinel lymph node biopsy. Her final pathology did reveal 0.35 centimeter and focus of invasive ductal carcinoma tumor was ER negative PR negative HER-2 negative with a proliferation marker [id number]%. 3 sentinel nodes were negative for metastatic disease.  #3 she did not receive any adjuvant chemotherapy due to the small disease. She was referred to radiation oncology. She is now status post radiation therapy administered by Dr. Kyung Rudd. She completed all of her radiation therapy on 07/21/2013.    PLAN: #1 we will continue to observe the patient for now no role for any type of adjuvant therapy.  #2 patient will be seen back in 8 months time. I will plan on starting my appointments with Dr. Josetta Huddle appointment so that one of [place] sees  the patient every 6 months.  Thank you so much for allowing me to participate in the care of Emi Holes. I will continue to follow up the patient with you and assist in her care.  All questions were answered. The patient knows to call the clinic with any problems, questions or concerns. We can certainly see the patient much sooner if necessary.  I spent 15 minutes counseling the patient face to face. The total time spent in the appointment was 25 minutes.  Marcy Panning, MD Medical/Oncology [facility] [phone] (beeper) [phone] (Office)  08/07/2013, 11:47 AM

## 2013-08-14 ENCOUNTER — Ambulatory Visit (INDEPENDENT_AMBULATORY_CARE_PROVIDER_SITE_OTHER): Payer: BC Managed Care – PPO | Admitting: Surgery

## 2013-08-21 ENCOUNTER — Encounter: Payer: Self-pay | Admitting: Radiation Oncology

## 2013-08-24 ENCOUNTER — Encounter: Payer: Self-pay | Admitting: Radiation Oncology

## 2013-08-24 ENCOUNTER — Ambulatory Visit
Admission: RE | Admit: 2013-08-24 | Discharge: 2013-08-24 | Disposition: A | Discharge status: Home / Self Care | Payer: BC Managed Care – PPO | Source: Ambulatory Visit | Attending: Radiation Oncology | Admitting: Radiation Oncology

## 2013-08-24 ENCOUNTER — Telehealth: Payer: Self-pay | Admitting: *Deleted

## 2013-08-24 VITALS — BP 127/82 | HR 60 | Temp 98.2°F | Resp 20 | Wt 185.9 lb

## 2013-08-24 DIAGNOSIS — C50511 Malignant neoplasm of lower-outer quadrant of right female breast: Secondary | ICD-10-CM

## 2013-08-24 HISTORY — DX: Personal history of irradiation: Z92.3

## 2013-08-24 NOTE — Progress Notes (Signed)
Radiation Oncology         [phone]) [phone] ________________________________  Name: Caitlin Mcneil MRN: [id number]  Date: 08/24/2013  DOB: Sep 14, 1955  Follow-Up Visit Note  CC: [name], MD  Cornett, Joyice Faster., MD  Diagnosis:   Right-sided breast cancer  Interval Since Last Radiation:  Approximately one month   Narrative:  The patient returns today for routine follow-up.  She has done well overall since she finished treatment. The patient's skin has healed significantly since she completed her course of radiation treatment. She has not begun anti-hormonal treatment do to her tumor characteristics. She is seen by Dr. Humphrey Rolls.                     ALLERGIES:  is allergic to penicillins and aspirin.  Meds: Current Outpatient Prescriptions  Medication Sig Dispense Refill  . Acetaminophen (TYLENOL ARTHRITIS PAIN PO) Take 2 tablets by mouth 2 (two) times daily.      . Black Cohosh 40 MG CAPS Take 40 mg by mouth at bedtime.      . cholecalciferol (VITAMIN D) 1000 UNITS tablet Take 1,000 Units by mouth daily.      . clonazePAM (KLONOPIN) 0.5 MG tablet Take 0.5 mg by mouth 2 (two) times daily as needed for anxiety.      . Loperamide HCl (IMODIUM PO) Take 2 tablets by mouth daily as needed.       . pregabalin (LYRICA) 100 MG capsule Take 100 mg by mouth 2 (two) times daily.      . pseudoephedrine (SUDAFED) 30 MG tablet Take 30 mg by mouth every 4 (four) hours as needed for congestion.      . ziprasidone (GEODON) 40 MG capsule Take 40 mg by mouth daily with supper.      . zolpidem (AMBIEN CR) 12.5 MG CR tablet Take 12.5 mg by mouth at bedtime as needed for sleep.       No current facility-administered medications for this encounter.    Physical Findings: The patient is in no acute distress. Patient is alert and oriented.  weight is 185 lb 14.4 oz (84.324 kg). Her oral temperature is 98.2 F (36.8 C). Her blood pressure is 127/82 and her pulse is 60. Her respiration is 20. .   The skin in the  treatment area has healed satisfactorily, no areas of concern/moist desquamation/poor healing  Lab Findings: Lab Results  Component Value Date   WBC 8.6 04/05/2013   HGB 14.3 05/02/2013   HCT 39.4 04/05/2013   MCV 91.6 04/05/2013   PLT 146 04/05/2013     Radiographic Findings: No results found.  Impression:    The patient has done satisfactorily since finishing treatment. She has not begun anti-hormonal treatment. The tumor was receptor negative.  Plan:  The patient will followup in our clinic on a when necessary basis.   Jodelle Gross, M.D., Ph.D.

## 2013-08-24 NOTE — Telephone Encounter (Signed)
error 

## 2013-08-24 NOTE — Progress Notes (Signed)
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

## 2013-08-29 ENCOUNTER — Ambulatory Visit (INDEPENDENT_AMBULATORY_CARE_PROVIDER_SITE_OTHER): Payer: BC Managed Care – PPO | Admitting: Surgery

## 2013-08-29 ENCOUNTER — Encounter (INDEPENDENT_AMBULATORY_CARE_PROVIDER_SITE_OTHER): Payer: Self-pay | Admitting: Surgery

## 2013-08-29 VITALS — BP 120/80 | HR 70 | Resp 16 | Ht 63.0 in | Wt 185.0 lb

## 2013-08-29 DIAGNOSIS — Z853 Personal history of malignant neoplasm of breast: Secondary | ICD-10-CM

## 2013-08-29 NOTE — Patient Instructions (Signed)
Return 1 year. Mammogram due in august. Call to set up.

## 2013-08-29 NOTE — Progress Notes (Signed)
NAME: Caitlin Mcneil       DOB: [birth year]           DATE: 08/29/2013        MRN: [id number]  CC:   Chief Complaint  Patient presents with  . Breast Cancer Long Term Follow Up    breast exam    Caitlin Mcneil is a 58 y.o.Marland Kitchenfemale who presents for routine followup of her Right breast stage 1 cancer diagnosed in 02/2013 and treated with breast conservation. . She has no problems or concerns on either side.   PFSH: She has had no significant changes since the last visit here.  ROS: There have been no significant changes since the last visit here  EXAM:  VS: BP 120/80  Pulse 70  Resp 16  Ht $R'5\' 3"'Wx$  (1.6 m)  Wt 185 lb (83.915 kg)  BMI 32.78 kg/m2  General: The patient is alert, oriented, generally healthy appearing, NAD. Mood and affect are normal.  Breasts:  right breast with post surgical changes.  Left breast normal    Lymphatics: She has no axillary or supraclavicular adenopathy on either side.  Extremities: Full ROM of the surgical side with no lymphedema noted.  Data Reviewed: Oncology notes.  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 Patient is also found to have a simple cyst in the right side. Patient was seen in the [facility] clinic for discussion of treatment options. Patient underwent lumpectomy with sentinel lymph node biopsy.  #2 patient is now status post lumpectomy with sentinel lymph node biopsy. Her final pathology did reveal 0.35 centimeter and focus of invasive ductal carcinoma tumor was ER negative PR negative HER-2 negative with a proliferation marker [id number]%. 3 sentinel nodes were negative for metastatic disease.  #3 she did not receive any adjuvant chemotherapy due to the small disease. She was referred to radiation oncology. She is now status post radiation therapy administered by Dr. Kyung Rudd. She completed all of her radiation therapy on 07/21/2013.  Doing well, with no  evidence of recurrent cancer or new cancer  Plan: Will continue to follow up on an annual basis here.

## 2013-08-30 ENCOUNTER — Other Ambulatory Visit: Payer: Self-pay | Admitting: Obstetrics & Gynecology

## 2013-08-30 DIAGNOSIS — Z853 Personal history of malignant neoplasm of breast: Secondary | ICD-10-CM

## 2013-08-30 DIAGNOSIS — Z9889 Other specified postprocedural states: Secondary | ICD-10-CM

## 2014-02-28 ENCOUNTER — Telehealth: Payer: Self-pay | Admitting: Hematology

## 2014-02-28 NOTE — Telephone Encounter (Signed)
, °

## 2014-03-14 ENCOUNTER — Other Ambulatory Visit: Payer: Self-pay | Admitting: *Deleted

## 2014-03-14 ENCOUNTER — Ambulatory Visit (HOSPITAL_BASED_OUTPATIENT_CLINIC_OR_DEPARTMENT_OTHER): Payer: BC Managed Care – PPO | Admitting: Hematology

## 2014-03-14 ENCOUNTER — Other Ambulatory Visit (HOSPITAL_BASED_OUTPATIENT_CLINIC_OR_DEPARTMENT_OTHER): Payer: BC Managed Care – PPO

## 2014-03-14 VITALS — BP 137/77 | HR 70 | Temp 98.1°F | Resp 20 | Ht 63.0 in | Wt 172.5 lb

## 2014-03-14 DIAGNOSIS — Z853 Personal history of malignant neoplasm of breast: Secondary | ICD-10-CM

## 2014-03-14 DIAGNOSIS — C50919 Malignant neoplasm of unspecified site of unspecified female breast: Secondary | ICD-10-CM

## 2014-03-14 DIAGNOSIS — C50511 Malignant neoplasm of lower-outer quadrant of right female breast: Secondary | ICD-10-CM

## 2014-03-14 DIAGNOSIS — R232 Flushing: Secondary | ICD-10-CM

## 2014-03-14 LAB — CBC WITH DIFFERENTIAL/PLATELET
BASO%: 0.3 % (ref 0.0–2.0)
BASOS ABS: 0 10*3/uL (ref 0.0–0.1)
EOS%: 0.1 % (ref 0.0–7.0)
Eosinophils Absolute: 0 10*3/uL (ref 0.0–0.5)
HEMATOCRIT: 38.9 % (ref 34.8–46.6)
HEMOGLOBIN: 12.7 g/dL (ref 11.6–15.9)
LYMPH%: 19 % (ref 14.0–49.7)
MCH: 30.1 pg (ref 25.1–34.0)
MCHC: 32.7 g/dL (ref 31.5–36.0)
MCV: 91.8 fL (ref 79.5–101.0)
MONO#: 0.3 10*3/uL (ref 0.1–0.9)
MONO%: 5.3 % (ref 0.0–14.0)
NEUT#: 4.4 10*3/uL (ref 1.5–6.5)
NEUT%: 75.3 % (ref 38.4–76.8)
PLATELETS: 143 10*3/uL — AB (ref 145–400)
RBC: 4.24 10*6/uL (ref 3.70–5.45)
RDW: 12.7 % (ref 11.2–14.5)
WBC: 5.8 10*3/uL (ref 3.9–10.3)
lymph#: 1.1 10*3/uL (ref 0.9–3.3)

## 2014-03-14 LAB — COMPREHENSIVE METABOLIC PANEL (CC13)
ALT: 13 U/L (ref 0–55)
AST: 17 U/L (ref 5–34)
Albumin: 4.1 g/dL (ref 3.5–5.0)
Alkaline Phosphatase: 62 U/L (ref 40–150)
Anion Gap: 7 mEq/L (ref 3–11)
BUN: 28.9 mg/dL — ABNORMAL HIGH (ref 7.0–26.0)
CALCIUM: 9.2 mg/dL (ref 8.4–10.4)
CHLORIDE: 107 meq/L (ref 98–109)
CO2: 28 mEq/L (ref 22–29)
CREATININE: 0.8 mg/dL (ref 0.6–1.1)
Glucose: 146 mg/dl — ABNORMAL HIGH (ref 70–140)
Potassium: 4 mEq/L (ref 3.5–5.1)
Sodium: 142 mEq/L (ref 136–145)
Total Bilirubin: 0.32 mg/dL (ref 0.20–1.20)
Total Protein: 7.1 g/dL (ref 6.4–8.3)

## 2014-03-14 MED ORDER — VENLAFAXINE HCL 37.5 MG PO TABS
37.5000 mg | ORAL_TABLET | Freq: Every day | ORAL | Status: DC
Start: 2014-03-14 — End: 2014-12-25

## 2014-03-14 NOTE — Progress Notes (Signed)
Caitlin Mcneil [id number] Dec 01, 1955 58 y.o. 03/14/2014 7:32 PM  CC  Caitlin Fus, MD [address] [place] [address] Dr. Erroll Luna Dr. Kyung Rudd   Reason for office visit: Follow up on Breast cancer.  Diagnosis: 58 year old female with diagnosis of breast cancer of lower-outer quadrant of the right breast.   STAGE:   Breast cancer of lower-outer quadrant of right female breast   Primary site: Breast (Right)   Staging method: AJCC 7th Edition   Clinical: Stage 0 (Tis (DCIS), N0, cM0)   Summary: Stage 0 (Tis (DCIS), N0, cM0) Pathologic staging: T1 N0(stage I)  PATIENT IDENTIFICATION:  Caitlin Mcneil is a 58 y.o. female.  With bipolar disorder and neuropathy.   #1 Patient had a screening mammogram performed that showed calcifications in the right breast. She had magnification views of the right breast that showed 2 x 5 mm of the calcifications within the lower outer quadrant. A circumscribed oval mass was also present in the upper left breast representing a benign or nipple cyst on ultrasound. Patient had a biopsy performed of the mass in the right the biopsy showed a ductal carcinoma in situ with calcifications estrogen receptor negative and progesterone receptor negative.  #2 Patient had an MRI scan attempted but it was not performed due to claustrophobia.  #3 patient underwent right lumpectomy with sentinel lymph node biopsy on 05/02/2013. Pathology did reveal a T1 N0 disease. The invasive component was 0.35 cm. Intermediate grade. 3 sentinel nodes were negative. Tumor was triple negative.   #4 status post radiation therapy to the right breast administered 06/06/2013 through 07/21/2013  Interval history: Patient was last seen here by Dr  Humphrey Rolls on 08/07/2013. She completed all of her radiation therapy. She is healing quite nicely. She denies any headaches double vision blurring of vision fevers chills night sweats no shortness of breath chest pains or  palpitations.She was asking me if she can go back on taking Estrogen hormone as she is having lot of hot flashes. I said I will not recommend the estrogen preparation in any form and although I understand that her last tumor was small and ER negative, using exogenous estrogen will increase her risk of getting a new breast cancer. Remainder of the 10 point review of systems is negative. She has a mammogram coming up on [facility] She is also getting a DEXA scan and annual physical tomorrow with Dr Nori Riis and may get a pap smear as well.  Current therapy: Observation  Past Medical History: Past Medical History  Diagnosis Date  . Bipolar 1 disorder   . Anxiety   . Depression   . Contact lens/glasses fitting     wears contacts or glasses  . Breast cancer 03/30/13    right breast  . History of radiation therapy 110/10/14-12/26/14    right breast, 60.4Gy    Past Surgical History: Past Surgical History  Procedure Laterality Date  . Foot surgery  2002    lt hammer toe-reconstruction  . Abdominal hysterectomy  1996  . Wisdom tooth extraction    . Hardware removal  2002    lt  foot hardware out  . Breast lumpectomy with needle localization and axillary sentinel lymph node bx Right 05/02/2013    Procedure: BREAST LUMPECTOMY WITH NEEDLE LOCALIZATION AND AXILLARY SENTINEL LYMPH NODE BX;  Surgeon: Joyice Faster. Cornett, MD;  Location: [facility];  Service: General;  Laterality: Right;  . Breast biopsy Right 03/30/13  Ductal carcinoma in situ/calcifications,loq    Family History: Family History  Problem Relation Age of Onset  . Colon cancer Father 49  . Alzheimer's disease Mother   . Alzheimer's disease Maternal Grandmother   . Cancer Maternal Grandfather     Cancer - NOS  . Breast cancer Other     maternal great aunt    Social History History  Substance Use Topics  . Smoking status: Never Smoker   . Smokeless tobacco: Never Used  . Alcohol Use: No     Allergies: Allergies  Allergen Reactions  . Penicillins Anaphylaxis  . Aspirin Rash    Current Medications: Current Outpatient Prescriptions  Medication Sig Dispense Refill  . Acetaminophen (TYLENOL ARTHRITIS PAIN PO) Take 2 tablets by mouth 2 (two) times daily.      . cholecalciferol (VITAMIN D) 1000 UNITS tablet Take 1,000 Units by mouth daily.      . Loperamide HCl (IMODIUM PO) Take 2 tablets by mouth daily as needed.       . pregabalin (LYRICA) 100 MG capsule Take 100 mg by mouth 2 (two) times daily.      . pseudoephedrine (SUDAFED) 30 MG tablet Take 30 mg by mouth every 4 (four) hours as needed for congestion.      . temazepam (RESTORIL) 15 MG capsule Take 30 mg by mouth at bedtime as needed for sleep.      . traZODone (DESYREL) 100 MG tablet Take 200 mg by mouth at bedtime.      . ziprasidone (GEODON) 40 MG capsule Take 40 mg by mouth daily with supper.      . zolpidem (AMBIEN CR) 12.5 MG CR tablet Take 12.5 mg by mouth at bedtime as needed for sleep.      Marland Kitchen venlafaxine (EFFEXOR) 37.5 MG tablet Take 1 tablet (37.5 mg total) by mouth daily.  30 tablet  3   No current facility-administered medications for this visit.    OB/GYN History:menarche at age 42 patient is postmenopausal she has been on hormone replacement therapy for 18 years. She is currently using them and I asked her to discontinue. Patient's first live birth was at 58.  Fertility Discussion:not applicable Prior History of Cancer:no prior  Health Maintenance:  Colonoscopyjust 2007 Bone Density2013 Last PAP smearup to date  ECOG PERFORMANCE STATUS: 1 - Symptomatic but completely ambulatory  Genetic Counseling/testing:no  REVIEW OF SYSTEMS: Comprehensive review of systems was obtained. It was significant for anxiety depression phobia as bruising easily as well as arthritis. Patient also complains of fatigue pain in her feet. Remainder of the 14 point review of systems was negative.  PHYSICAL  EXAMINATION: Blood pressure 137/77, pulse 70, temperature 98.1 F (36.7 C), temperature source Oral, resp. rate 20, height $RemoveBe'5\' 3"'cmWlUOEQs$  (1.6 m), weight 172 lb 8 oz (78.245 kg).  XVQ:MGQQP, healthy, no distress, well nourished, well developed and anxious SKIN: skin color, texture, turgor are normal HEAD: Normocephalic EYES: PERRLA, EOMI, Conjunctiva are pink and non-injected EARS: External ears normal OROPHARYNX:no exudate, no erythema and lips, buccal mucosa, and tongue normal  NECK: no adenopathy, thyroid normal size, non-tender, without nodularity LYMPH:  no palpable lymphadenopathy, no hepatosplenomegaly BREAST:breasts appear normal, no suspicious masses, no skin or nipple changes or axillary nodes LUNGS: clear to auscultation  HEART: regular rate & rhythm ABDOMEN:abdomen soft, non-tender, obese, normal bowel sounds and no masses or organomegaly BACK: Back symmetric, no curvature., No CVA tenderness EXTREMITIES:no edema, no clubbing, no cyanosis  NEURO: alert & oriented x 3 with fluent  speech, no focal motor/sensory deficits, gait normal  RADIOLOGY STUDIES:  Patient is getting her next diagnostic mammogram on 03/20/2014   LABS:      imen Gross and Clinical Information  ASSESSMENT    58 year old female with  #1  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 Patient is also found to have a simple cyst in the right side. Patient was seen in the [facility] clinic for discussion of treatment options. Patient underwent lumpectomy with sentinel lymph node biopsy with Dr Erroll Luna on 05/02/2013.  #2 Her final pathology did reveal 0.35 centimeter and focus of invasive ductal carcinoma tumor was ER negative PR negative HER-2 negative with a proliferation marker [id number]%. 3 sentinel nodes were negative for metastatic disease.  #3 she did not receive any adjuvant chemotherapy due to the small disease. She was referred to radiation oncology. She  is status post radiation therapy administered by Dr. Kyung Rudd. She completed all of her radiation therapy on 07/21/2013.  #4 Her biggest symptom now is hot flashes and she is also on several psych medications for insomnia and bipolar disorder. These include Temazepam, Trazodone, Ambien CR, Geodon, Lyrica etc. She is not on a SSRI.   PLAN:  #1 we will continue to observe the patient for now no role for any type of adjuvant therapy with ESTROGEN NEGATIVE breast cancer.  #2 patient will be seen back in 6 months time. She has a mammogram setup next week for her surveillance. Her physical exam and breast exam was normal today. Her labs were normal and there is mild thrombocytopenia which we will continue to watch.  #3 I called an spoke with Dr Norma Fredrickson about her current medications and I am adding Effexor 37.5 mg once a day to help her hot flashes. He was agreeable. If that does not work or alleviate the hot flashes we can consider a trial of Vitamin E 400 I.U. Daily.   All questions were answered. The patient knows to call the clinic with any problems, questions or concerns. We can certainly see the patient much sooner if necessary.  I spent 25 minutes counseling the patient face to face. The total time spent in the appointment was 25 minutes.  Bernadene Bell, MD Medical Hematologist/Oncologist [place] Pager: [phone] Office No: [phone]  03/14/2014, 7:32 PM

## 2014-03-15 ENCOUNTER — Ambulatory Visit: Payer: BC Managed Care – PPO | Admitting: Oncology

## 2014-03-15 ENCOUNTER — Other Ambulatory Visit: Payer: BC Managed Care – PPO

## 2014-03-20 ENCOUNTER — Telehealth: Payer: Self-pay | Admitting: Hematology and Oncology

## 2014-03-20 ENCOUNTER — Ambulatory Visit
Admission: RE | Admit: 2014-03-20 | Discharge: 2014-03-20 | Disposition: A | Discharge status: Home / Self Care | Payer: BC Managed Care – PPO | Source: Ambulatory Visit | Attending: Obstetrics & Gynecology | Admitting: Obstetrics & Gynecology

## 2014-03-20 DIAGNOSIS — Z9889 Other specified postprocedural states: Secondary | ICD-10-CM

## 2014-03-20 DIAGNOSIS — Z853 Personal history of malignant neoplasm of breast: Secondary | ICD-10-CM

## 2014-03-20 NOTE — Telephone Encounter (Signed)
, °

## 2014-05-09 ENCOUNTER — Telehealth: Payer: Self-pay | Admitting: Hematology and Oncology

## 2014-05-09 NOTE — Telephone Encounter (Signed)
Lvm advising appt chg from 2/16 (md on pal) to 2/23 @ 1.45p. Also mailed revised appt calendar.

## 2014-09-11 ENCOUNTER — Ambulatory Visit: Payer: BC Managed Care – PPO | Admitting: Hematology and Oncology

## 2014-09-11 ENCOUNTER — Other Ambulatory Visit: Payer: BC Managed Care – PPO

## 2014-09-17 ENCOUNTER — Other Ambulatory Visit: Payer: Self-pay

## 2014-09-17 DIAGNOSIS — C50511 Malignant neoplasm of lower-outer quadrant of right female breast: Secondary | ICD-10-CM

## 2014-09-18 ENCOUNTER — Telehealth: Payer: Self-pay | Admitting: Hematology and Oncology

## 2014-09-18 ENCOUNTER — Ambulatory Visit (HOSPITAL_BASED_OUTPATIENT_CLINIC_OR_DEPARTMENT_OTHER): Payer: BLUE CROSS/BLUE SHIELD | Admitting: Hematology and Oncology

## 2014-09-18 ENCOUNTER — Other Ambulatory Visit (HOSPITAL_BASED_OUTPATIENT_CLINIC_OR_DEPARTMENT_OTHER): Payer: BLUE CROSS/BLUE SHIELD

## 2014-09-18 VITALS — BP 123/77 | HR 71 | Temp 98.2°F | Resp 18 | Ht 63.0 in | Wt 166.3 lb

## 2014-09-18 DIAGNOSIS — C50511 Malignant neoplasm of lower-outer quadrant of right female breast: Secondary | ICD-10-CM

## 2014-09-18 DIAGNOSIS — Z853 Personal history of malignant neoplasm of breast: Secondary | ICD-10-CM

## 2014-09-18 LAB — CBC WITH DIFFERENTIAL/PLATELET
BASO%: 0 % (ref 0.0–2.0)
Basophils Absolute: 0 10*3/uL (ref 0.0–0.1)
EOS ABS: 0 10*3/uL (ref 0.0–0.5)
EOS%: 0.1 % (ref 0.0–7.0)
HCT: 39.8 % (ref 34.8–46.6)
HGB: 13.1 g/dL (ref 11.6–15.9)
LYMPH%: 14.1 % (ref 14.0–49.7)
MCH: 30.6 pg (ref 25.1–34.0)
MCHC: 32.9 g/dL (ref 31.5–36.0)
MCV: 93 fL (ref 79.5–101.0)
MONO#: 0.4 10*3/uL (ref 0.1–0.9)
MONO%: 5.1 % (ref 0.0–14.0)
NEUT#: 6.3 10*3/uL (ref 1.5–6.5)
NEUT%: 80.7 % — ABNORMAL HIGH (ref 38.4–76.8)
PLATELETS: 140 10*3/uL — AB (ref 145–400)
RBC: 4.28 10*6/uL (ref 3.70–5.45)
RDW: 12.5 % (ref 11.2–14.5)
WBC: 7.8 10*3/uL (ref 3.9–10.3)
lymph#: 1.1 10*3/uL (ref 0.9–3.3)

## 2014-09-18 LAB — COMPREHENSIVE METABOLIC PANEL (CC13)
ALT: 9 U/L (ref 0–55)
ANION GAP: 8 meq/L (ref 3–11)
AST: 16 U/L (ref 5–34)
Albumin: 4.2 g/dL (ref 3.5–5.0)
Alkaline Phosphatase: 48 U/L (ref 40–150)
BUN: 17.3 mg/dL (ref 7.0–26.0)
CO2: 27 mEq/L (ref 22–29)
CREATININE: 0.7 mg/dL (ref 0.6–1.1)
Calcium: 9 mg/dL (ref 8.4–10.4)
Chloride: 106 mEq/L (ref 98–109)
EGFR: >90 mL/min/{1.73_m2} (ref >90)
Glucose: 87 mg/dl (ref 70–140)
Potassium: 4.1 mEq/L (ref 3.5–5.1)
Sodium: 141 mEq/L (ref 136–145)
Total Bilirubin: 0.8 mg/dL (ref 0.20–1.20)
Total Protein: 6.9 g/dL (ref 6.4–8.3)

## 2014-09-18 NOTE — Assessment & Plan Note (Signed)
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 Mammogram 03/20/2014 is normal 2. Breast exam 09/18/2014 is normal  Survivorship:Discussed the importance of physical exercise in decreasing the likelihood of breast cancer recurrence. Recommended 30 mins daily 6 days a week of either brisk walking or cycling or swimming. Encouraged patient to eat more fruits and vegetables and decrease red meat.

## 2014-09-18 NOTE — Progress Notes (Signed)
Patient Care Team: [name], MD as PCP - General (Obstetrics and Gynecology)  DIAGNOSIS: Breast cancer of lower-outer quadrant of right female breast   Staging form: Breast, AJCC 7th Edition     Clinical: Stage 0 (Tis (DCIS), N0, cM0) - Unsigned       Staging comments: Staged at breast conference 04/05/13.      Pathologic: No stage assigned - Unsigned   SUMMARY OF ONCOLOGIC HISTORY:   Breast cancer of lower-outer quadrant of right female breast   05/02/2013 Surgery Right breast lumpectomy: Tumor size 0.35 cm, intermediate grade, 3 sentinel nodes negative, ER/PR HER-2 negative, T1 N0 M0 stage IA   06/06/2013 - 07/21/2013 Radiation Therapy Adjuvant radiation therapy    CHIEF COMPLIANT: Follow-up of breast cancer  INTERVAL HISTORY: Caitlin Mcneil is a 59 year old lady with above-mentioned history of right breast cancer treated with lumpectomy and radiation and is currently in observation. She reports no new problems or concerns. She gets mammograms every August and they have been normal.  REVIEW OF SYSTEMS:   Constitutional: Denies fevers, chills or abnormal weight loss Eyes: Denies blurriness of vision Ears, nose, mouth, throat, and face: Denies mucositis or sore throat Respiratory: Denies cough, dyspnea or wheezes Cardiovascular: Denies palpitation, chest discomfort or lower extremity swelling Gastrointestinal:  Denies nausea, heartburn or change in bowel habits Skin: Denies abnormal skin rashes Lymphatics: Denies new lymphadenopathy or easy bruising Neurological:Denies numbness, tingling or new weaknesses Behavioral/Psych: Mood is stable, no new changes  Breast:  denies any pain or lumps or nodules in either breasts All other systems were reviewed with the patient and are negative.  I have reviewed the past medical history, past surgical history, social history and family history with the patient and they are unchanged from previous note.  ALLERGIES:  is allergic to  penicillins and aspirin.  MEDICATIONS:  Current Outpatient Prescriptions  Medication Sig Dispense Refill  . Acetaminophen (TYLENOL ARTHRITIS PAIN PO) Take 2 tablets by mouth 2 (two) times daily.    . cholecalciferol (VITAMIN D) 1000 UNITS tablet Take 1,000 Units by mouth daily.    . Loperamide HCl (IMODIUM PO) Take 2 tablets by mouth daily as needed.     . pregabalin (LYRICA) 100 MG capsule Take 100 mg by mouth 2 (two) times daily.    . pseudoephedrine (SUDAFED) 30 MG tablet Take 30 mg by mouth every 4 (four) hours as needed for congestion.    . temazepam (RESTORIL) 15 MG capsule Take 30 mg by mouth at bedtime as needed for sleep.    . traZODone (DESYREL) 100 MG tablet Take 200 mg by mouth at bedtime.    Marland Kitchen UNABLE TO FIND Med Name: Estrogen 0.$RemoveBeforeDE'1mg'AwvjvgaXvZQkZSl$  patch    . ziprasidone (GEODON) 40 MG capsule Take 40 mg by mouth daily with supper.    . zolpidem (AMBIEN CR) 12.5 MG CR tablet Take 12.5 mg by mouth at bedtime as needed for sleep.    Marland Kitchen venlafaxine (EFFEXOR) 37.5 MG tablet Take 1 tablet (37.5 mg total) by mouth daily. (Patient not taking: Reported on 09/18/2014) 30 tablet 3   No current facility-administered medications for this visit.    PHYSICAL EXAMINATION: ECOG PERFORMANCE STATUS: 0 - Asymptomatic  Filed Vitals:   09/18/14 1354  BP: 123/77  Pulse: 71  Temp: 98.2 F (36.8 C)  Resp: 18   Filed Weights   09/18/14 1354  Weight: 166 lb 4.8 oz (75.433 kg)    GENERAL:alert, no distress and comfortable SKIN: skin color, texture, turgor  are normal, no rashes or significant lesions EYES: normal, Conjunctiva are pink and non-injected, sclera clear OROPHARYNX:no exudate, no erythema and lips, buccal mucosa, and tongue normal  NECK: supple, thyroid normal size, non-tender, without nodularity LYMPH:  no palpable lymphadenopathy in the cervical, axillary or inguinal LUNGS: clear to auscultation and percussion with normal breathing effort HEART: regular rate & rhythm and no murmurs and no  lower extremity edema ABDOMEN:abdomen soft, non-tender and normal bowel sounds Musculoskeletal:no cyanosis of digits and no clubbing  NEURO: alert & oriented x 3 with fluent speech, no focal motor/sensory deficits BREAST: No palpable masses or nodules in either right or left breasts. No palpable axillary supraclavicular or infraclavicular adenopathy no breast tenderness or nipple discharge. (exam performed in the presence of a chaperone)  LABORATORY DATA:  I have reviewed the data as listed   Chemistry      Component Value Date/Time   NA 141 09/18/2014 1342   K 4.1 09/18/2014 1342   CO2 27 09/18/2014 1342   BUN 17.3 09/18/2014 1342   CREATININE 0.7 09/18/2014 1342      Component Value Date/Time   CALCIUM 9.0 09/18/2014 1342   ALKPHOS 48 09/18/2014 1342   AST 16 09/18/2014 1342   ALT 9 09/18/2014 1342   BILITOT 0.80 09/18/2014 1342       Lab Results  Component Value Date   WBC 7.8 09/18/2014   HGB 13.1 09/18/2014   HCT 39.8 09/18/2014   MCV 93.0 09/18/2014   PLT 140* 09/18/2014   NEUTROABS 6.3 09/18/2014   ASSESSMENT & PLAN:  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 Mammogram 03/20/2014 is normal 2. Breast exam 09/18/2014 is normal  Survivorship:Discussed the importance of physical exercise in decreasing the likelihood of breast cancer recurrence. Recommended 30 mins daily 6 days a week of either brisk walking or cycling or swimming. Encouraged patient to eat more fruits and vegetables and decrease red meat.     No orders of the defined types were placed in this encounter.   The patient has a good understanding of the overall plan. she agrees with it. She will call with any problems that may develop before her next visit here.   Rulon Eisenmenger,  MD

## 2014-09-18 NOTE — Telephone Encounter (Signed)
Mailed a one calendar appt to pt  Caitlin Mcneil

## 2014-12-04 ENCOUNTER — Other Ambulatory Visit: Payer: Self-pay | Admitting: Obstetrics & Gynecology

## 2014-12-04 DIAGNOSIS — Z853 Personal history of malignant neoplasm of breast: Secondary | ICD-10-CM

## 2014-12-23 ENCOUNTER — Inpatient Hospital Stay (HOSPITAL_COMMUNITY)
Admission: EM | Admit: 2014-12-23 | Discharge: 2014-12-25 | DRG: 917 | Disposition: A | Discharge status: Other Acute Inpatient Hospital | Payer: BLUE CROSS/BLUE SHIELD | Attending: Internal Medicine | Admitting: Internal Medicine

## 2014-12-23 ENCOUNTER — Encounter (HOSPITAL_COMMUNITY): Payer: Self-pay | Admitting: *Deleted

## 2014-12-23 DIAGNOSIS — Z90710 Acquired absence of both cervix and uterus: Secondary | ICD-10-CM | POA: Diagnosis not present

## 2014-12-23 DIAGNOSIS — F4010 Social phobia, unspecified: Secondary | ICD-10-CM | POA: Diagnosis present

## 2014-12-23 DIAGNOSIS — Z923 Personal history of irradiation: Secondary | ICD-10-CM | POA: Diagnosis not present

## 2014-12-23 DIAGNOSIS — G9340 Encephalopathy, unspecified: Secondary | ICD-10-CM | POA: Diagnosis present

## 2014-12-23 DIAGNOSIS — T50902A Poisoning by unspecified drugs, medicaments and biological substances, intentional self-harm, initial encounter: Secondary | ICD-10-CM | POA: Diagnosis not present

## 2014-12-23 DIAGNOSIS — T50902S Poisoning by unspecified drugs, medicaments and biological substances, intentional self-harm, sequela: Secondary | ICD-10-CM | POA: Diagnosis not present

## 2014-12-23 DIAGNOSIS — F319 Bipolar disorder, unspecified: Secondary | ICD-10-CM | POA: Diagnosis present

## 2014-12-23 DIAGNOSIS — D696 Thrombocytopenia, unspecified: Secondary | ICD-10-CM | POA: Diagnosis present

## 2014-12-23 DIAGNOSIS — T426X2A Poisoning by other antiepileptic and sedative-hypnotic drugs, intentional self-harm, initial encounter: Secondary | ICD-10-CM | POA: Diagnosis not present

## 2014-12-23 DIAGNOSIS — T50901A Poisoning by unspecified drugs, medicaments and biological substances, accidental (unintentional), initial encounter: Secondary | ICD-10-CM | POA: Diagnosis present

## 2014-12-23 DIAGNOSIS — Y92009 Unspecified place in unspecified non-institutional (private) residence as the place of occurrence of the external cause: Secondary | ICD-10-CM

## 2014-12-23 DIAGNOSIS — Z853 Personal history of malignant neoplasm of breast: Secondary | ICD-10-CM

## 2014-12-23 DIAGNOSIS — G4700 Insomnia, unspecified: Secondary | ICD-10-CM | POA: Diagnosis present

## 2014-12-23 DIAGNOSIS — F3130 Bipolar disorder, current episode depressed, mild or moderate severity, unspecified: Secondary | ICD-10-CM | POA: Diagnosis not present

## 2014-12-23 DIAGNOSIS — R45851 Suicidal ideations: Secondary | ICD-10-CM | POA: Diagnosis not present

## 2014-12-23 LAB — CBC
HEMATOCRIT: 38.8 % (ref 36.0–46.0)
HEMOGLOBIN: 12.7 g/dL (ref 12.0–15.0)
MCH: 30.8 pg (ref 26.0–34.0)
MCHC: 32.7 g/dL (ref 30.0–36.0)
MCV: 93.9 fL (ref 78.0–100.0)
PLATELETS: 124 10*3/uL — AB (ref 150–400)
RBC: 4.13 MIL/uL (ref 3.87–5.11)
RDW: 12.6 % (ref 11.5–15.5)
WBC: 6.3 10*3/uL (ref 4.0–10.5)

## 2014-12-23 LAB — COMPREHENSIVE METABOLIC PANEL
ALT: 12 U/L — ABNORMAL LOW (ref 14–54)
ANION GAP: 6 (ref 5–15)
AST: 15 U/L (ref 15–41)
Albumin: 3.9 g/dL (ref 3.5–5.0)
Alkaline Phosphatase: 44 U/L (ref 38–126)
BUN: 21 mg/dL — AB (ref 6–20)
CHLORIDE: 106 mmol/L (ref 101–111)
CO2: 28 mmol/L (ref 22–32)
Calcium: 8.9 mg/dL (ref 8.9–10.3)
Creatinine, Ser: 0.67 mg/dL (ref 0.44–1.00)
GFR calc Af Amer: >60 mL/min (ref >60)
Glucose, Bld: 104 mg/dL — ABNORMAL HIGH (ref 65–99)
Potassium: 4.2 mmol/L (ref 3.5–5.1)
SODIUM: 140 mmol/L (ref 135–145)
Total Bilirubin: 0.6 mg/dL (ref 0.3–1.2)
Total Protein: 6.7 g/dL (ref 6.5–8.1)

## 2014-12-23 LAB — RAPID URINE DRUG SCREEN, HOSP PERFORMED
Amphetamines: NOT DETECTED
Barbiturates: NOT DETECTED
Benzodiazepines: POSITIVE — AB
COCAINE: NOT DETECTED
Opiates: NOT DETECTED
Tetrahydrocannabinol: NOT DETECTED

## 2014-12-23 LAB — TSH: TSH: 1.004 u[IU]/mL (ref 0.350–4.500)

## 2014-12-23 LAB — SALICYLATE LEVEL

## 2014-12-23 LAB — ACETAMINOPHEN LEVEL
ACETAMINOPHEN (TYLENOL), SERUM: 10 ug/mL (ref 10–30)
Acetaminophen (Tylenol), Serum: <10 ug/mL — ABNORMAL LOW (ref 10–30)

## 2014-12-23 LAB — MRSA PCR SCREENING: MRSA by PCR: NEGATIVE

## 2014-12-23 LAB — ETHANOL: Alcohol, Ethyl (B): <5 mg/dL (ref <5)

## 2014-12-23 MED ORDER — ENOXAPARIN SODIUM 40 MG/0.4ML ~~LOC~~ SOLN
40.0000 mg | SUBCUTANEOUS | Status: DC
  Administered 2014-12-23 – 2014-12-24 (×2): 40 mg via SUBCUTANEOUS
  Filled 2014-12-23 (×2): qty 0.4

## 2014-12-23 MED ORDER — SODIUM CHLORIDE 0.9 % IV SOLN
INTRAVENOUS | Status: AC
  Administered 2014-12-23: 16:00:00 via INTRAVENOUS

## 2014-12-23 MED ORDER — SODIUM CHLORIDE 0.9 % IV BOLUS (SEPSIS)
1000.0000 mL | Freq: Once | INTRAVENOUS | Status: AC
  Administered 2014-12-23: 1000 mL via INTRAVENOUS

## 2014-12-23 NOTE — H&P (Signed)
History and Physical  Caitlin Mcneil ZOX:[id number] DOB: 1956/06/13 DOA: 12/23/2014  Referring physician: EDP PCP: [name], MD   Chief Complaint: intentional ambien overdose  HPI: Caitlin Mcneil is a 59 y.o. female with h/o bipolar, remote h/o suicidal attempt in her 20's was brought to the ER due to overdose of ambien.  Per husband, patient likely took 70-80 tabs of ambien CR sometime between 12 to 12:30 today, husband saw an empty ambien bottle, initially patient was awake and talking and told husband that she tried to "escape from reality" by taking all the pills. Husband called EMS, and patient was found sleeping /not responsive upon arrival, but maintaining airway. Basic labs/EKg unremarkable, patient does not  Wakeup after hours in the ED, hospitalist called for admission.    Review of Systems:  Detail per HPI, Review of systems are otherwise negative  Past Medical History  Diagnosis Date  . Bipolar 1 disorder   . Anxiety   . Depression   . Contact lens/glasses fitting     wears contacts or glasses  . Breast cancer 03/30/13    right breast  . History of radiation therapy 110/10/14-12/26/14    right breast, 60.4Gy   Past Surgical History  Procedure Laterality Date  . Foot surgery  2002    lt hammer toe-reconstruction  . Abdominal hysterectomy  1996  . Wisdom tooth extraction    . Hardware removal  2002    lt  foot hardware out  . Breast lumpectomy with needle localization and axillary sentinel lymph node bx Right 05/02/2013    Procedure: BREAST LUMPECTOMY WITH NEEDLE LOCALIZATION AND AXILLARY SENTINEL LYMPH NODE BX;  Surgeon: Joyice Faster. Cornett, MD;  Location: [place];  Service: General;  Laterality: Right;  . Breast biopsy Right 03/30/13    Ductal carcinoma in situ/calcifications,loq   Social History:  reports that she has never smoked. She has never used smokeless tobacco. She reports that she does not drink alcohol or use illicit drugs. Patient  lives at home & is able to participate in activities of daily living independently   Allergies  Allergen Reactions  . Penicillins Anaphylaxis  . Aspirin Rash    Family History  Problem Relation Age of Onset  . Colon cancer Father 59  . Alzheimer's disease Mother   . Alzheimer's disease Maternal Grandmother   . Cancer Maternal Grandfather     Cancer - NOS  . Breast cancer Other     maternal great aunt      Prior to Admission medications   Medication Sig Start Date End Date Taking? Authorizing Provider  acetaminophen (TYLENOL) 325 MG tablet Take 1,300 mg by mouth 2 (two) times daily.   Yes Historical Provider, MD  cholecalciferol (VITAMIN D) 1000 UNITS tablet Take 1,000 Units by mouth daily.   Yes Historical Provider, MD  Loperamide HCl (IMODIUM PO) Take 2 tablets by mouth daily as needed.    Yes Historical Provider, MD  MINIVELLE 0.1 MG/24HR patch  10/14/14  Yes Historical Provider, MD  pregabalin (LYRICA) 100 MG capsule Take 100 mg by mouth 2 (two) times daily.   Yes Historical Provider, MD  pseudoephedrine (SUDAFED) 30 MG tablet Take 30 mg by mouth every 4 (four) hours as needed for congestion.   Yes Historical Provider, MD  temazepam (RESTORIL) 15 MG capsule Take 30 mg by mouth at bedtime.    Yes Historical Provider, MD  traZODone (DESYREL) 50 MG tablet Take 200 mg by mouth at bedtime.  11/05/14  Yes Historical Provider, MD  VAGIFEM 10 MCG TABS vaginal tablet  09/17/14  Yes Historical Provider, MD  ziprasidone (GEODON) 40 MG capsule Take 40 mg by mouth daily with supper.   Yes Historical Provider, MD  zolpidem (AMBIEN CR) 12.5 MG CR tablet Take 12.5 mg by mouth at bedtime as needed for sleep.   Yes Historical Provider, MD  venlafaxine (EFFEXOR) 37.5 MG tablet Take 1 tablet (37.5 mg total) by mouth daily. Patient not taking: Reported on 09/18/2014 03/14/14   Bernadene Bell, MD    Physical Exam: BP 104/67 mmHg  Pulse 57  Temp(Src) 96.2 F (35.7 C) (Axillary)  Resp 14  SpO2  100%  General:  Not open eyes to painful stimuli, but maintaining airway Eyes: PERRL ENT: unremarkable Neck: supple, no JVD Cardiovascular: RRR Respiratory: CTABL Abdomen: soft/ND/ND, positive bowel sounds Skin: no rash Musculoskeletal:  No edema Psychiatric: as above Neurologic: as above          Labs on Admission:  Basic Metabolic Panel:  Recent Labs Lab 12/23/14 1340  NA 140  K 4.2  CL 106  CO2 28  GLUCOSE 104*  BUN 21*  CREATININE 0.67  CALCIUM 8.9   Liver Function Tests:  Recent Labs Lab 12/23/14 1340  AST 15  ALT 12*  ALKPHOS 44  BILITOT 0.6  PROT 6.7  ALBUMIN 3.9   No results for input(s): LIPASE, AMYLASE in the last 168 hours. No results for input(s): AMMONIA in the last 168 hours. CBC:  Recent Labs Lab 12/23/14 1340  WBC 6.3  HGB 12.7  HCT 38.8  MCV 93.9  PLT 124*   Cardiac Enzymes: No results for input(s): CKTOTAL, CKMB, CKMBINDEX, TROPONINI in the last 168 hours.  BNP (last 3 results) No results for input(s): BNP in the last 8760 hours.  ProBNP (last 3 results) No results for input(s): PROBNP in the last 8760 hours.  CBG: No results for input(s): GLUCAP in the last 168 hours.  Radiological Exams on Admission: No results found.  EKG: Independently reviewed. NSR, QTc wnl, no acute ST/T changes  Assessment/Plan Present on Admission:  . Overdose  Intentional Ambien CR overdose: basic labs unremarkable, EKG no acute changes, admit to stepdown, continue ivf, npo, currently patient maintains Airway. Behavioral health consulted, Dr Lenna Sciara will see patient tomorrow.  H/o bipolar,anxiety/depression/insomnia: all oral med held.   H/o stage IA breast ca s/p lumpectomy/XRT, in remission.    DVT prophylaxis: lovenox  Consultants: psych  Code Status: full   Family Communication:  husband  Disposition Plan: admit to stepdown  Time spent: 69mins  Presley Gora MD, PhD [facility] Pager [phone] If 7PM-7AM, please contact  night-coverage at [email or web address], password [facility]

## 2014-12-23 NOTE — ED Notes (Signed)
Report called to Maudie Mercury, RN ICU/SD.  Sbar reviewed, all questions answered

## 2014-12-23 NOTE — ED Notes (Signed)
Pt took approximately 90 ambien tablets within the last hour and a half, not sure of exact time.  Hx bipolar, SI She wanted to "escape from reality" Vitals 114/72 74 97 ra Spouse in waiting

## 2014-12-23 NOTE — ED Notes (Signed)
Family at bedside. 

## 2014-12-23 NOTE — ED Notes (Signed)
Bed: RESB Expected date: 12/23/14 Expected time: 1:05 PM Means of arrival:  Comments: 90 ambiem 30 min ago

## 2014-12-23 NOTE — ED Provider Notes (Signed)
CSN: [id number]     Arrival date & time 12/23/14  1325 History   First MD Initiated Contact with Patient 12/23/14 1335     Chief Complaint  Patient presents with  . Drug Overdose     (Consider location/radiation/quality/duration/timing/severity/associated sxs/prior Treatment) HPI  A LEVEL 5 CAVEAT PERTAINS DUE TO ALTERED MENTAL STATUS History obtained from husband at bedside.  He states that he was not at home but estimates patient took between 60-90 ambien tablets.  When he came home he saw the empty ambien bottle that he estimates had at least 60 tablets - patient was awake and talking and told him that she tried to "escape from reality" by taking all the pills.  Husband called EMS - upon arrival to the ED she was sleeping and not able to contribute to the history.   Past Medical History  Diagnosis Date  . Bipolar 1 disorder   . Anxiety   . Depression   . Contact lens/glasses fitting     wears contacts or glasses  . Breast cancer 03/30/13    right breast  . History of radiation therapy 110/10/14-12/26/14    right breast, 60.4Gy   Past Surgical History  Procedure Laterality Date  . Foot surgery  2002    lt hammer toe-reconstruction  . Abdominal hysterectomy  1996  . Wisdom tooth extraction    . Hardware removal  2002    lt  foot hardware out  . Breast lumpectomy with needle localization and axillary sentinel lymph node bx Right 05/02/2013    Procedure: BREAST LUMPECTOMY WITH NEEDLE LOCALIZATION AND AXILLARY SENTINEL LYMPH NODE BX;  Surgeon: Joyice Faster. Cornett, MD;  Location: [facility];  Service: General;  Laterality: Right;  . Breast biopsy Right 03/30/13    Ductal carcinoma in situ/calcifications,loq   Family History  Problem Relation Age of Onset  . Colon cancer Father 21  . Alzheimer's disease Mother   . Alzheimer's disease Maternal Grandmother   . Cancer Maternal Grandfather     Cancer - NOS  . Breast cancer Other     maternal great aunt   History   Substance Use Topics  . Smoking status: Never Smoker   . Smokeless tobacco: Never Used  . Alcohol Use: No   OB History    No data available     Review of Systems  UNABLE TO OBTAIN ROS DUE TO LEVEL 5 CAVEAT    Allergies  Penicillins and Aspirin  Home Medications   Prior to Admission medications   Medication Sig Start Date End Date Taking? Authorizing Provider  acetaminophen (TYLENOL) 325 MG tablet Take 1,300 mg by mouth 2 (two) times daily.   Yes Historical Provider, MD  cholecalciferol (VITAMIN D) 1000 UNITS tablet Take 1,000 Units by mouth daily.   Yes Historical Provider, MD  Loperamide HCl (IMODIUM PO) Take 2 tablets by mouth daily as needed.    Yes Historical Provider, MD  MINIVELLE 0.1 MG/24HR patch Place 1 patch onto the skin 2 (two) times a week.  10/14/14  Yes Historical Provider, MD  pregabalin (LYRICA) 100 MG capsule Take 100 mg by mouth 2 (two) times daily.   Yes Historical Provider, MD  pseudoephedrine (SUDAFED) 30 MG tablet Take 30 mg by mouth every 4 (four) hours as needed for congestion.   Yes Historical Provider, MD  temazepam (RESTORIL) 15 MG capsule Take 30 mg by mouth at bedtime.    Yes Historical Provider, MD  traZODone (DESYREL) 50 MG tablet Take 200  mg by mouth at bedtime.  11/05/14  Yes Historical Provider, MD  VAGIFEM 10 MCG TABS vaginal tablet Place 1 tablet vaginally daily.  09/17/14  Yes Historical Provider, MD  ziprasidone (GEODON) 40 MG capsule Take 40 mg by mouth daily with supper.   Yes Historical Provider, MD  zolpidem (AMBIEN CR) 12.5 MG CR tablet Take 12.5 mg by mouth 2 (two) times daily.    Yes Historical Provider, MD  venlafaxine (EFFEXOR) 37.5 MG tablet Take 1 tablet (37.5 mg total) by mouth daily. Patient not taking: Reported on 09/18/2014 03/14/14   Aasim Lona Kettle, MD   BP 114/69 mmHg  Pulse 65  Temp(Src) 98.1 F (36.7 C) (Oral)  Resp 18  Ht 5\' 6"  (1.676 m)  Wt 168 lb 6.9 oz (76.4 kg)  BMI 27.20 kg/m2  SpO2 100%  Vitals reviewed Physical  Exam  Physical Examination: General appearance -  Somnolent, decreased responsive,  and in no distress Mental status - somnolent, protecting airway but not answering questions Eyes - pupils equal and reactive, extraocular eye movements intact Mouth - mucous membranes moist, pharynx normal without lesions Chest - clear to auscultation, no wheezes, rales or rhonchi, symmetric air entry Heart - normal rate, regular rhythm, normal S1, S2, no murmurs, rubs, clicks or gallops Neurological - somnolent, no seizure activity, normal tone, arousable with sternal rub but not answering questions Extremities - peripheral pulses normal, no pedal edema, no clubbing or cyanosis Skin - normal coloration and turgor, no rashes Psych- somnolent, cannot infer psychiatric state at this time  ED Course  Procedures (including critical care time)  CRITICAL CARE Performed by: Threasa Beards Total critical care time: 45 Critical care time was exclusive of separately billable procedures and treating other patients. Critical care was necessary to treat or prevent imminent or life-threatening deterioration. Critical care was time spent personally by me on the following activities: development of treatment plan with patient and/or surrogate as well as nursing, discussions with consultants, evaluation of patient's response to treatment, examination of patient, obtaining history from patient or surrogate, ordering and performing treatments and interventions, ordering and review of laboratory studies, ordering and review of radiographic studies, pulse oximetry and re-evaluation of patient's condition. Labs Review Labs Reviewed  CBC - Abnormal; Notable for the following:    Platelets 124 (*)    All other components within normal limits  COMPREHENSIVE METABOLIC PANEL - Abnormal; Notable for the following:    Glucose, Bld 104 (*)    BUN 21 (*)    ALT 12 (*)    All other components within normal limits  URINE RAPID DRUG  SCREEN (HOSP PERFORMED) NOT AT [facility] - Abnormal; Notable for the following:    Benzodiazepines POSITIVE (*)    All other components within normal limits  ACETAMINOPHEN LEVEL - Abnormal; Notable for the following:    Acetaminophen (Tylenol), Serum <10 (*)    All other components within normal limits  ACETAMINOPHEN LEVEL - Abnormal; Notable for the following:    Acetaminophen (Tylenol), Serum <10 (*)    All other components within normal limits  CBC - Abnormal; Notable for the following:    Platelets 120 (*)    All other components within normal limits  BASIC METABOLIC PANEL - Abnormal; Notable for the following:    Calcium 8.7 (*)    All other components within normal limits  MRSA PCR SCREENING  ACETAMINOPHEN LEVEL  ETHANOL  SALICYLATE LEVEL  TSH    Imaging Review No results found.   EKG Interpretation  Date/Time:  Sunday Dec 23 2014 13:28:44 EDT Ventricular Rate:  74 PR Interval:  156 QRS Duration: 89 QT Interval:  380 QTC Calculation: 422 R Axis:   156 Text Interpretation:  Sinus rhythm Consider left atrial enlargement  Probable lateral infarct, age indeterminate No old tracing to compare  Confirmed by [facility]  MD, Tawsha Terrero [phone]) on 12/23/2014 2:17:01 PM      MDM   Final diagnoses:  None  intentional overdose Altered mental status  Pt presenting after intentional overdose- husband states he found empty bottle of 60-90 ambien tablets.  She was intiially awake and told him that she had taken the pills to escape from reality.  ekg reassuring, normal intervals, labs reassuring.  Pt protecting her airway but very somnolent.  Iv fluid bolus.  4 hour tyelnol level ordered for 4pm.  Pt admitted to stepdown unit for further observation.  Will need psychiatry evaluation once medically cleared.  3:46 PM d/w Dr. Erlinda Hong, [facility] to be admitted to step down bed.     Alfonzo Beers, MD 12/25/14 [phone]

## 2014-12-23 NOTE — ED Notes (Signed)
Poison Control called, spoke with [place].  Pt took 90 Ambien 12.5 mg at approximately 12:10 pm today, per patient's spouse (at bedside).    Recommendation:  Too late for charcoal, perform 12 lead EKG, 4 hour tylenol level (4-4:30 pm), keep patient hydrated and watch her for a minimum of 4 hours or until she is back to baseline  Information given to Dr. Canary Brim

## 2014-12-23 NOTE — ED Notes (Signed)
Labs drawn via existing iv line (left ac)

## 2014-12-23 NOTE — ED Notes (Signed)
md in room, 02 applied to patient at 3lpm nasal cannula, vss Pt does not respond to verbal or tactile stimuli Husband at bedside

## 2014-12-23 NOTE — ED Notes (Signed)
MD at bedside. 

## 2014-12-24 DIAGNOSIS — T50902S Poisoning by unspecified drugs, medicaments and biological substances, intentional self-harm, sequela: Secondary | ICD-10-CM

## 2014-12-24 DIAGNOSIS — F319 Bipolar disorder, unspecified: Secondary | ICD-10-CM

## 2014-12-24 DIAGNOSIS — D696 Thrombocytopenia, unspecified: Secondary | ICD-10-CM

## 2014-12-24 DIAGNOSIS — G9340 Encephalopathy, unspecified: Secondary | ICD-10-CM

## 2014-12-24 DIAGNOSIS — R45851 Suicidal ideations: Secondary | ICD-10-CM

## 2014-12-24 DIAGNOSIS — T426X2A Poisoning by other antiepileptic and sedative-hypnotic drugs, intentional self-harm, initial encounter: Principal | ICD-10-CM

## 2014-12-24 DIAGNOSIS — G4700 Insomnia, unspecified: Secondary | ICD-10-CM

## 2014-12-24 DIAGNOSIS — F4010 Social phobia, unspecified: Secondary | ICD-10-CM

## 2014-12-24 LAB — BASIC METABOLIC PANEL
Anion gap: 8 (ref 5–15)
BUN: 19 mg/dL (ref 6–20)
CO2: 26 mmol/L (ref 22–32)
CREATININE: 0.68 mg/dL (ref 0.44–1.00)
Calcium: 8.7 mg/dL — ABNORMAL LOW (ref 8.9–10.3)
Chloride: 109 mmol/L (ref 101–111)
GFR calc non Af Amer: >60 mL/min (ref >60)
Glucose, Bld: 82 mg/dL (ref 65–99)
Potassium: 4.3 mmol/L (ref 3.5–5.1)
Sodium: 143 mmol/L (ref 135–145)

## 2014-12-24 LAB — CBC
HCT: 39.1 % (ref 36.0–46.0)
Hemoglobin: 12.8 g/dL (ref 12.0–15.0)
MCH: 30.8 pg (ref 26.0–34.0)
MCHC: 32.7 g/dL (ref 30.0–36.0)
MCV: 94 fL (ref 78.0–100.0)
Platelets: 120 10*3/uL — ABNORMAL LOW (ref 150–400)
RBC: 4.16 MIL/uL (ref 3.87–5.11)
RDW: 12.9 % (ref 11.5–15.5)
WBC: 9.5 10*3/uL (ref 4.0–10.5)

## 2014-12-24 MED ORDER — PREGABALIN 50 MG PO CAPS
100.0000 mg | ORAL_CAPSULE | Freq: Two times a day (BID) | ORAL | Status: DC
  Administered 2014-12-24 – 2014-12-25 (×3): 100 mg via ORAL
  Filled 2014-12-24: qty 1
  Filled 2014-12-24: qty 2
  Filled 2014-12-24: qty 4

## 2014-12-24 MED ORDER — TEMAZEPAM 15 MG PO CAPS
30.0000 mg | ORAL_CAPSULE | Freq: Every day | ORAL | Status: DC
  Administered 2014-12-24: 30 mg via ORAL
  Filled 2014-12-24: qty 2

## 2014-12-24 MED ORDER — VITAMIN D 1000 UNITS PO TABS
1000.0000 [IU] | ORAL_TABLET | Freq: Every day | ORAL | Status: DC
  Administered 2014-12-24 – 2014-12-25 (×2): 1000 [IU] via ORAL
  Filled 2014-12-24 (×2): qty 1

## 2014-12-24 NOTE — Consult Note (Signed)
[facility] Consult   Reason for Consult:  Intentional drug overdose and social anxiety, bipolar disorder Referring Physician:  Dr. Dortha Schwalbe Patient Identification: Caitlin Mcneil MRN:  [id number] Principal Diagnosis: <principal problem not specified> Diagnosis:   Patient Active Problem List   Diagnosis Date Noted  . Overdose [T50.901A] 12/23/2014  . Hot flashes not due to menopause [R23.2] 03/14/2014  . History of breast cancer [Z85.3] 08/29/2013  . Breast cancer of lower-outer quadrant of right female breast [C50.511] 04/05/2013    Total Time spent with patient: 1 hour  Subjective:   Caitlin Mcneil is a 59 y.o. female patient admitted with intentional drug overdose.  HPI:  Caitlin Mcneil is a 59 years old married white female admitted to [facility] long stepdown unit after presenting with intentional overdose of her medication Ambien approximately 70-80 tablets/whole bottle with intention to end her life. Patient reported that she revealed about her extramarital affair to her husband who was upset and left the house for about 20-30 minutes. When husband returned back he found her unresponsive and found empty bottles. Patient reported she thought it is not going to excuse her and not going to come back and concerned about her marriage is not going to work any longer. Patient also reported multiple stresses related to her social anxiety, 8 hours of of working while standing and leg pain second to bunions. Patient has a history of suicidal attempt by overdosing for long time ago when she was in her 34s. Patient has a significant family history of bipolar disorder and completed suicide in her maternal side of the family. Patient cannot contract for safety at this time. Patient reportedly has foot pain, anxiety, depression, insomnia, generalized weakness and being tired. Patient denies nausea, vomiting's a, diarrhea, dizziness.  Psychiatric history. Patient has been receiving outpatient  medication management from Dr. Casimiro [facility] and has been receiving multiple medication for her bipolar, anxiety and insomnia. Reportedly she has given multiple vacation trials over the years.  Social history: Patient married and living with her husband, has a 35 years old son, for his son and grandson. Patient continued to work in a [employer] over 10 years patient denies drinking alcohol and smoking tobacco and has no known drug abuse versus dependence.  HPI Elements:   Location:  Bipolar disorder, social anxiety. Quality:  Fair to poor. Severity:  Intentional overdose with suicide attempt. Timing:  Relationship problem. Duration:  2 days. Context:  Psychosocial and relationship problems..  Past Medical History:  Past Medical History  Diagnosis Date  . Bipolar 1 disorder   . Anxiety   . Depression   . Contact lens/glasses fitting     wears contacts or glasses  . Breast cancer 03/30/13    right breast  . History of radiation therapy 110/10/14-12/26/14    right breast, 60.4Gy    Past Surgical History  Procedure Laterality Date  . Foot surgery  2002    lt hammer toe-reconstruction  . Abdominal hysterectomy  1996  . Wisdom tooth extraction    . Hardware removal  2002    lt  foot hardware out  . Breast lumpectomy with needle localization and axillary sentinel lymph node bx Right 05/02/2013    Procedure: BREAST LUMPECTOMY WITH NEEDLE LOCALIZATION AND AXILLARY SENTINEL LYMPH NODE BX;  Surgeon: Joyice Faster. Cornett, MD;  Location: [facility];  Service: General;  Laterality: Right;  . Breast biopsy Right 03/30/13    Ductal carcinoma in  situ/calcifications,loq   Family History:  Family History  Problem Relation Age of Onset  . Colon cancer Father 32  . Alzheimer's disease Mother   . Alzheimer's disease Maternal Grandmother   . Cancer Maternal Grandfather     Cancer - NOS  . Breast cancer Other     maternal great aunt   Social  History:  History  Alcohol Use No     History  Drug Use No    History   Social History  . Marital Status: Married    Spouse Name: N/A  . Number of Children: 1  . Years of Education: N/A   Occupational History  .     Social History Main Topics  . Smoking status: Never Smoker   . Smokeless tobacco: Never Used  . Alcohol Use: No  . Drug Use: No  . Sexual Activity: Not Currently   Other Topics Concern  . None   Social History Narrative   Additional Social History:                          Allergies:   Allergies  Allergen Reactions  . Penicillins Anaphylaxis  . Aspirin Rash    Labs:  Results for orders placed or performed during the hospital encounter of 12/23/14 (from the past 48 hour(s))  Acetaminophen level     Status: None   Collection Time: 12/23/14  1:39 PM  Result Value Ref Range   Acetaminophen (Tylenol), Serum 10 10 - 30 ug/mL    Comment:        THERAPEUTIC CONCENTRATIONS VARY SIGNIFICANTLY. A RANGE OF 10-30 ug/mL MAY BE AN EFFECTIVE CONCENTRATION FOR MANY PATIENTS. HOWEVER, SOME ARE BEST TREATED AT CONCENTRATIONS OUTSIDE THIS RANGE. ACETAMINOPHEN CONCENTRATIONS >150 ug/mL AT 4 HOURS AFTER INGESTION AND >50 ug/mL AT 12 HOURS AFTER INGESTION ARE OFTEN ASSOCIATED WITH TOXIC REACTIONS.   Ethanol (ETOH)     Status: None   Collection Time: 12/23/14  1:39 PM  Result Value Ref Range   Alcohol, Ethyl (B) <5 <5 mg/dL    Comment:        LOWEST DETECTABLE LIMIT FOR SERUM ALCOHOL IS 11 mg/dL FOR MEDICAL PURPOSES ONLY   Salicylate level     Status: None   Collection Time: 12/23/14  1:39 PM  Result Value Ref Range   Salicylate Lvl <2.2 2.8 - 30.0 mg/dL  CBC     Status: Abnormal   Collection Time: 12/23/14  1:40 PM  Result Value Ref Range   WBC 6.3 4.0 - 10.5 K/uL   RBC 4.13 3.87 - 5.11 MIL/uL   Hemoglobin 12.7 12.0 - 15.0 g/dL   HCT 38.8 36.0 - 46.0 %   MCV 93.9 78.0 - 100.0 fL   MCH 30.8 26.0 - 34.0 pg   MCHC 32.7 30.0 - 36.0 g/dL    RDW 12.6 11.5 - 15.5 %   Platelets 124 (L) 150 - 400 K/uL  Comprehensive metabolic panel     Status: Abnormal   Collection Time: 12/23/14  1:40 PM  Result Value Ref Range   Sodium 140 135 - 145 mmol/L   Potassium 4.2 3.5 - 5.1 mmol/L   Chloride 106 101 - 111 mmol/L   CO2 28 22 - 32 mmol/L   Glucose, Bld 104 (H) 65 - 99 mg/dL   BUN 21 (H) 6 - 20 mg/dL   Creatinine, Ser 0.67 0.44 - 1.00 mg/dL   Calcium 8.9 8.9 - 10.3 mg/dL   Total  Protein 6.7 6.5 - 8.1 g/dL   Albumin 3.9 3.5 - 5.0 g/dL   AST 15 15 - 41 U/L   ALT 12 (L) 14 - 54 U/L   Alkaline Phosphatase 44 38 - 126 U/L   Total Bilirubin 0.6 0.3 - 1.2 mg/dL   GFR calc non Af Amer >60 >60 mL/min   GFR calc Af Amer >60 >60 mL/min    Comment: (NOTE) The eGFR has been calculated using the CKD EPI equation. This calculation has not been validated in all clinical situations. eGFR's persistently <60 mL/min signify possible Chronic Kidney Disease.    Anion gap 6 5 - 15  Acetaminophen level     Status: Abnormal   Collection Time: 12/23/14  1:51 PM  Result Value Ref Range   Acetaminophen (Tylenol), Serum <10 (L) 10 - 30 ug/mL    Comment:        THERAPEUTIC CONCENTRATIONS VARY SIGNIFICANTLY. A RANGE OF 10-30 ug/mL MAY BE AN EFFECTIVE CONCENTRATION FOR MANY PATIENTS. HOWEVER, SOME ARE BEST TREATED AT CONCENTRATIONS OUTSIDE THIS RANGE. ACETAMINOPHEN CONCENTRATIONS >150 ug/mL AT 4 HOURS AFTER INGESTION AND >50 ug/mL AT 12 HOURS AFTER INGESTION ARE OFTEN ASSOCIATED WITH TOXIC REACTIONS.   Urine Drug Screen     Status: Abnormal   Collection Time: 12/23/14  2:12 PM  Result Value Ref Range   Opiates NONE DETECTED NONE DETECTED   Cocaine NONE DETECTED NONE DETECTED   Benzodiazepines POSITIVE (A) NONE DETECTED   Amphetamines NONE DETECTED NONE DETECTED   Tetrahydrocannabinol NONE DETECTED NONE DETECTED   Barbiturates NONE DETECTED NONE DETECTED    Comment:        DRUG SCREEN FOR MEDICAL PURPOSES ONLY.  IF CONFIRMATION IS  NEEDED FOR ANY PURPOSE, NOTIFY LAB WITHIN 5 DAYS.        LOWEST DETECTABLE LIMITS FOR URINE DRUG SCREEN Drug Class       Cutoff (ng/mL) Amphetamine      1000 Barbiturate      200 Benzodiazepine   865 Tricyclics       784 Opiates          300 Cocaine          300 THC              50   Acetaminophen level     Status: Abnormal   Collection Time: 12/23/14  5:27 PM  Result Value Ref Range   Acetaminophen (Tylenol), Serum <10 (L) 10 - 30 ug/mL    Comment:        THERAPEUTIC CONCENTRATIONS VARY SIGNIFICANTLY. A RANGE OF 10-30 ug/mL MAY BE AN EFFECTIVE CONCENTRATION FOR MANY PATIENTS. HOWEVER, SOME ARE BEST TREATED AT CONCENTRATIONS OUTSIDE THIS RANGE. ACETAMINOPHEN CONCENTRATIONS >150 ug/mL AT 4 HOURS AFTER INGESTION AND >50 ug/mL AT 12 HOURS AFTER INGESTION ARE OFTEN ASSOCIATED WITH TOXIC REACTIONS.   TSH     Status: None   Collection Time: 12/23/14  5:27 PM  Result Value Ref Range   TSH 1.004 0.350 - 4.500 uIU/mL  MRSA PCR Screening     Status: None   Collection Time: 12/23/14  5:35 PM  Result Value Ref Range   MRSA by PCR NEGATIVE NEGATIVE    Comment:        The GeneXpert MRSA Assay (FDA approved for NASAL specimens only), is one component of a comprehensive MRSA colonization surveillance program. It is not intended to diagnose MRSA infection nor to guide or monitor treatment for MRSA infections.   CBC     Status:  Abnormal   Collection Time: 12/24/14  3:45 AM  Result Value Ref Range   WBC 9.5 4.0 - 10.5 K/uL   RBC 4.16 3.87 - 5.11 MIL/uL   Hemoglobin 12.8 12.0 - 15.0 g/dL   HCT 39.1 36.0 - 46.0 %   MCV 94.0 78.0 - 100.0 fL   MCH 30.8 26.0 - 34.0 pg   MCHC 32.7 30.0 - 36.0 g/dL   RDW 12.9 11.5 - 15.5 %   Platelets 120 (L) 150 - 400 K/uL  Basic metabolic panel     Status: Abnormal   Collection Time: 12/24/14  3:45 AM  Result Value Ref Range   Sodium 143 135 - 145 mmol/L   Potassium 4.3 3.5 - 5.1 mmol/L   Chloride 109 101 - 111 mmol/L   CO2 26 22 - 32  mmol/L   Glucose, Bld 82 65 - 99 mg/dL   BUN 19 6 - 20 mg/dL   Creatinine, Ser 0.68 0.44 - 1.00 mg/dL   Calcium 8.7 (L) 8.9 - 10.3 mg/dL   GFR calc non Af Amer >60 >60 mL/min   GFR calc Af Amer >60 >60 mL/min    Comment: (NOTE) The eGFR has been calculated using the CKD EPI equation. This calculation has not been validated in all clinical situations. eGFR's persistently <60 mL/min signify possible Chronic Kidney Disease.    Anion gap 8 5 - 15    Vitals: Blood pressure 132/84, pulse 62, temperature 97.9 F (36.6 C), temperature source Oral, resp. rate 16, height _0  (1.676 m), weight 76.4 kg (168 lb 6.9 oz), SpO2 97 %.  Risk to Self: Is patient at risk for suicide?: Yes Risk to Others:   Prior Inpatient Therapy:   Prior Outpatient Therapy:    Current Facility-Administered Medications  Medication Dose Route Frequency Provider Last Rate Last Dose  . cholecalciferol (VITAMIN D) tablet 1,000 Units  1,000 Units Oral Daily Robbie Lis, MD      . enoxaparin (LOVENOX) injection 40 mg  40 mg Subcutaneous Q24H Florencia Reasons, MD   40 mg at 12/23/14 2231  . pregabalin (LYRICA) capsule 100 mg  100 mg Oral BID Robbie Lis, MD      . temazepam (RESTORIL) capsule 30 mg  30 mg Oral QHS Robbie Lis, MD        Musculoskeletal: Strength & Muscle Tone: decreased Gait & Station: unable to stand Patient leans: N/A  Psychiatric Specialty Exam: Physical Exam as per history and physical   ROS leg pain secondary to bunions, generalized weakness, drowsiness, psychomotor retardation No Fever-chills, No Headache, No changes with Vision or hearing, reports vertigo No problems swallowing food or Liquids, No Chest pain, Cough or Shortness of Breath, No Abdominal pain, No Nausea or Vommitting, Bowel movements are regular, No Blood in stool or Urine, No dysuria, No new skin rashes or bruises, No new joints pains-aches,  No new weakness, tingling, numbness in any extremity, No recent weight gain or  loss, No polyuria, polydypsia or polyphagia,   A full 10 point Review of Systems was done, except as stated above, all other Review of Systems were negative.  Blood pressure 132/84, pulse 62, temperature 97.9 F (36.6 C), temperature source Oral, resp. rate 16, height _1  (1.676 m), weight 76.4 kg (168 lb 6.9 oz), SpO2 97 %.Body mass index is 27.2 kg/(m^2).  General Appearance: Guarded  Eye Contact::  Good  Speech:  Clear and Coherent and Slow  Volume:  Decreased  Mood:  Anxious,  Depressed and Hopeless  Affect:  Constricted and Depressed  Thought Process:  Coherent and Goal Directed  Orientation:  Full (Time, Place, and Person)  Thought Content:  Rumination  Suicidal Thoughts:  Yes.  with intent/plan  Homicidal Thoughts:  No  Memory:  Immediate;   Good Recent;   Good Remote;   Good  Judgement:  Impaired  Insight:  Fair  Psychomotor Activity:  Decreased  Concentration:  Fair  Recall:  [place] of Knowledge:Good  Language: Good  Akathisia:  NA  Handed:  Right  AIMS (if indicated):     Assets:  Communication Skills Desire for Improvement Financial Resources/Insurance Housing Leisure Time Physical Health Resilience Social Support Talents/Skills Transportation Vocational/Educational  ADL's:  Intact  Cognition: WNL  Sleep:      Medical Decision Making: New problem, with additional work up planned, Review of Psycho-Social Stressors (1), Review or order clinical lab tests (1), Discuss test with performing physician (1), Established Problem, Worsening (2), Review of Medication Regimen & Side Effects (2) and Review of New Medication or Change in Dosage (2)  Treatment Plan Summary: Patient presented with the status post intentional suicide overdose after had a conflict with her husband regarding extramarital affair. Patient has been suffering with bipolar disorder, depression, social anxiety and insomnia. She has no homicidal ideations or psychotic symptoms. Patient cannot  contract for safety.  Daily contact with patient to assess and evaluate symptoms and progress in treatment and Medication management  Plan:  Discontinue Ambien secondary to recent intentional overdose  Suicide attempt: Safety sitter one-to-one  Patient home medications may restart if appropriate other than Ambien  Recommend psychiatric Inpatient admission when medically cleared. Supportive therapy provided about ongoing stressors.   Appreciate psychiatric consultation and follow up as clinically required Please contact [phone] or [phone] if needs further assistance  Disposition: Refer to the psychiatric social service regarding inpatient psychiatric placement.  Arianne Klinge,JANARDHAHA R. 12/24/2014 10:29 AM

## 2014-12-24 NOTE — Progress Notes (Addendum)
Patient ID: Caitlin Mcneil, female   DOB: 1956/01/11, 59 y.o.   MRN: [id number] [facility] PROGRESS NOTE  Caitlin Mcneil [id number] DOB: 1956-04-01 DOA: 12/23/2014 PCP: [name], MD  Brief narrative:    59 y.o. female with h/o bipolar, remote h/o suicidal attempt in her 20's who presented to [facility] ED with intentional overdose on Ambien. Per her husband, pt apparently took 70-80 tablets of Ambien and pt told her husband she "tried to escape the really". Patient's husband then found her sleeping and not responding.  On admission, the vitals were stable. The 12 lead EKG showed normal sinus rhythm. Blood wokr was notable for mild thrombocytopenia (platelet count 124) otherwise unremarkable. Alcohol level was WNL and UDS positive for benzos.  She was admitted to stepdown unit. Safety sitter at bedside and psych consulted.    Assessment/Plan:    Active Problems: Suicidal ideations / Suicidal attempt by overdose / Acute encephalopathy  - Overdose on Ambien in suicidal attempt - Sitter at bedside - Her 12 lead EKG was normal on admission. Blood work essentially unremarkable.  - UDS positive for benzos - Alcohol level WNL - Psych consulted - Mental status seems better compared to admission's however pt still lethargic - Hemodynamically stable for transfer to telemetry floor  Thrombocytopenia - Mild, unclear etiology - Continue to monitor CBC - No reports of bleeding    DVT Prophylaxis  - Lovenox subQ ordered    Code Status: Full.  Family Communication:  Family not at the bedside  Disposition Plan: transfer to telemetry floor today .   IV access:  Peripheral IV  Procedures and diagnostic studies:    No results found.  Medical Consultants:  Psychiatry   Other Consultants:  None   IAnti-Infectives:   None    Leisa Lenz, MD  [facility] Pager [phone]  Time spent in minutes: 25 minutes  If 7PM-7AM, please contact  night-coverage [email or web address] Password [email or web address] 12/24/2014, 10:07 AM   LOS: 1 day    HPI/Subjective: No acute overnight events. Patient reports no nausea or vomiting. Unable to verbalize where she is.  Objective: Filed Vitals:   12/24/14 0700 12/24/14 0756 12/24/14 0800 12/24/14 0908  BP: 128/84  132/86 132/84  Pulse: 75  65 62  Temp:  97.9 F (36.6 C)    TempSrc:  Oral    Resp: 11  21 16   Height:      Weight:      SpO2: 98%  97% 97%    Intake/Output Summary (Last 24 hours) at 12/24/14 1007 Last data filed at 12/24/14 0737  Gross per 24 hour  Intake 2083.33 ml  Output    945 ml  Net 1138.33 ml    Exam:   General:  Pt is alert but slow to respond to questions, slightly lethargic   Cardiovascular: Regular rate and rhythm, S1/S2 (+)  Respiratory: Clear to auscultation bilaterally, no wheezing, no crackles, no rhonchi  Abdomen: Soft, non tender, non distended, bowel sounds present  Extremities: No edema, pulses DP and PT palpable bilaterally  Neuro: Grossly nonfocal  Data Reviewed: Basic Metabolic Panel:  Recent Labs Lab 12/23/14 1340 12/24/14 0345  NA 140 143  K 4.2 4.3  CL 106 109  CO2 28 26  GLUCOSE 104* 82  BUN 21* 19  CREATININE 0.67 0.68  CALCIUM 8.9 8.7*   Liver Function Tests:  Recent Labs Lab 12/23/14 1340  AST 15  ALT 12*  ALKPHOS 44  BILITOT 0.6  PROT 6.7  ALBUMIN  3.9   No results for input(s): LIPASE, AMYLASE in the last 168 hours. No results for input(s): AMMONIA in the last 168 hours. CBC:  Recent Labs Lab 12/23/14 1340 12/24/14 0345  WBC 6.3 9.5  HGB 12.7 12.8  HCT 38.8 39.1  MCV 93.9 94.0  PLT 124* 120*   Cardiac Enzymes: No results for input(s): CKTOTAL, CKMB, CKMBINDEX, TROPONINI in the last 168 hours. BNP: Invalid input(s): POCBNP CBG: No results for input(s): GLUCAP in the last 168 hours.  MRSA PCR Screening     Status: None   Collection Time: 12/23/14  5:35 PM  Result Value Ref Range Status   MRSA by PCR  NEGATIVE NEGATIVE Final     Scheduled Meds: . cholecalciferol  1,000 Units Oral Daily  . enoxaparin (LOVENOX) injection  40 mg Subcutaneous Q24H  . pregabalin  100 mg Oral BID  . temazepam  30 mg Oral QHS

## 2014-12-25 ENCOUNTER — Encounter: Payer: Self-pay | Admitting: Psychiatry

## 2014-12-25 ENCOUNTER — Inpatient Hospital Stay
Admission: EM | Admit: 2014-12-25 | Discharge: 2014-12-27 | DRG: 885 | Disposition: A | Discharge status: Home / Self Care | Payer: BLUE CROSS/BLUE SHIELD | Source: Intra-hospital | Attending: Psychiatry | Admitting: Psychiatry

## 2014-12-25 DIAGNOSIS — Z820 Family history of epilepsy and other diseases of the nervous system: Secondary | ICD-10-CM | POA: Diagnosis not present

## 2014-12-25 DIAGNOSIS — Z853 Personal history of malignant neoplasm of breast: Secondary | ICD-10-CM

## 2014-12-25 DIAGNOSIS — Z79899 Other long term (current) drug therapy: Secondary | ICD-10-CM

## 2014-12-25 DIAGNOSIS — F411 Generalized anxiety disorder: Secondary | ICD-10-CM | POA: Diagnosis present

## 2014-12-25 DIAGNOSIS — Z923 Personal history of irradiation: Secondary | ICD-10-CM

## 2014-12-25 DIAGNOSIS — Z915 Personal history of self-harm: Secondary | ICD-10-CM

## 2014-12-25 DIAGNOSIS — Z90710 Acquired absence of both cervix and uterus: Secondary | ICD-10-CM | POA: Diagnosis not present

## 2014-12-25 DIAGNOSIS — Z9889 Other specified postprocedural states: Secondary | ICD-10-CM | POA: Diagnosis not present

## 2014-12-25 DIAGNOSIS — Z888 Allergy status to other drugs, medicaments and biological substances status: Secondary | ICD-10-CM

## 2014-12-25 DIAGNOSIS — Z880 Allergy status to penicillin: Secondary | ICD-10-CM

## 2014-12-25 DIAGNOSIS — Z800 Family history of malignant neoplasm of digestive organs: Secondary | ICD-10-CM | POA: Diagnosis not present

## 2014-12-25 DIAGNOSIS — G4700 Insomnia, unspecified: Secondary | ICD-10-CM | POA: Diagnosis present

## 2014-12-25 DIAGNOSIS — R45851 Suicidal ideations: Secondary | ICD-10-CM | POA: Diagnosis present

## 2014-12-25 DIAGNOSIS — Z803 Family history of malignant neoplasm of breast: Secondary | ICD-10-CM | POA: Diagnosis not present

## 2014-12-25 DIAGNOSIS — F3130 Bipolar disorder, current episode depressed, mild or moderate severity, unspecified: Secondary | ICD-10-CM | POA: Diagnosis present

## 2014-12-25 DIAGNOSIS — T50902A Poisoning by unspecified drugs, medicaments and biological substances, intentional self-harm, initial encounter: Secondary | ICD-10-CM

## 2014-12-25 DIAGNOSIS — F319 Bipolar disorder, unspecified: Principal | ICD-10-CM | POA: Diagnosis present

## 2014-12-25 MED ORDER — TRAZODONE HCL 100 MG PO TABS
200.0000 mg | ORAL_TABLET | Freq: Every day | ORAL | Status: DC
  Administered 2014-12-25: 200 mg via ORAL
  Filled 2014-12-25: qty 2

## 2014-12-25 MED ORDER — MAGNESIUM HYDROXIDE 400 MG/5ML PO SUSP: 30.0000 mL | Freq: Every day | ORAL | Status: DC | PRN

## 2014-12-25 MED ORDER — PREGABALIN 50 MG PO CAPS
100.0000 mg | ORAL_CAPSULE | Freq: Two times a day (BID) | ORAL | Status: DC
  Administered 2014-12-25 – 2014-12-26 (×2): 100 mg via ORAL

## 2014-12-25 MED ORDER — ESTRADIOL 0.1 MG/24HR TD PTWK
0.1000 mg | MEDICATED_PATCH | TRANSDERMAL | Status: DC
  Filled 2014-12-25: qty 1

## 2014-12-25 MED ORDER — LOPERAMIDE HCL 2 MG PO CAPS: 2.0000 mg | ORAL_CAPSULE | Freq: Two times a day (BID) | ORAL | Status: DC | PRN

## 2014-12-25 MED ORDER — LOPERAMIDE HCL 2 MG PO CAPS: 4.0000 mg | ORAL_CAPSULE | ORAL | Status: DC | PRN

## 2014-12-25 MED ORDER — VITAMIN D 1000 UNITS PO TABS
1000.0000 [IU] | ORAL_TABLET | Freq: Every day | ORAL | Status: DC
  Administered 2014-12-25 – 2014-12-27 (×3): 1000 [IU] via ORAL
  Filled 2014-12-25 (×3): qty 1

## 2014-12-25 MED ORDER — ACETAMINOPHEN 325 MG PO TABS
650.0000 mg | ORAL_TABLET | ORAL | Status: DC | PRN
  Administered 2014-12-25: 650 mg via ORAL
  Filled 2014-12-25 (×2): qty 2

## 2014-12-25 MED ORDER — TEMAZEPAM 15 MG PO CAPS: 30.0000 mg | ORAL_CAPSULE | Freq: Every evening | ORAL | Status: DC | PRN

## 2014-12-25 MED ORDER — ALUM & MAG HYDROXIDE-SIMETH 200-200-20 MG/5ML PO SUSP: 30.0000 mL | ORAL | Status: DC | PRN

## 2014-12-25 MED ORDER — VITAMIN D 1000 UNITS PO TABS: 1000.0000 [IU] | ORAL_TABLET | Freq: Every day | ORAL | Status: DC

## 2014-12-25 MED ORDER — ACETAMINOPHEN 325 MG PO TABS: 650.0000 mg | ORAL_TABLET | ORAL | Status: DC | PRN

## 2014-12-25 MED ORDER — PREGABALIN 50 MG PO CAPS
100.0000 mg | ORAL_CAPSULE | Freq: Two times a day (BID) | ORAL | Status: DC
  Administered 2014-12-26 – 2014-12-27 (×2): 100 mg via ORAL
  Filled 2014-12-25 (×4): qty 2

## 2014-12-25 MED ORDER — LOPERAMIDE HCL 2 MG PO CAPS
2.0000 mg | ORAL_CAPSULE | Freq: Two times a day (BID) | ORAL | Status: DC | PRN
  Administered 2014-12-25: 2 mg via ORAL
  Filled 2014-12-25: qty 1

## 2014-12-25 MED ORDER — HYDROXYZINE HCL 50 MG PO TABS: 50.0000 mg | ORAL_TABLET | Freq: Three times a day (TID) | ORAL | Status: DC | PRN

## 2014-12-25 MED ORDER — ACETAMINOPHEN 325 MG PO TABS: 650.0000 mg | ORAL_TABLET | Freq: Four times a day (QID) | ORAL | Status: DC | PRN

## 2014-12-25 NOTE — Care Management Note (Signed)
Case Management Note  Patient Details  Name: Caitlin Mcneil MRN: [id number] Date of Birth: 02-28-1956  Subjective/Objective:                   Chief Complaint: intentional ambien overdose Action/Plan:  Discharge  planning Expected Discharge Date:  unknown               Expected Discharge Plan:  In-House Referral:     Discharge planning Services  CM Consult  Post Acute Care Choice:    Choice offered to:     DME Arranged:    DME Agency:     HH Arranged:    [place] Agency:     Status of Service:     Medicare Important Message Given:    Date Medicare IM Given:    Medicare IM give by:    Date Additional Medicare IM Given:    Additional Medicare Important Message give by:     If discussed at [place] of Stay Meetings, dates discussed:    Additional Comments: CM notes current plan for discharge is inpatient psych; CSW aware.  No other CM needs were communicated at this time. CM will follow for progress. Dellie Catholic, RN 12/25/2014, 1:21 PM

## 2014-12-25 NOTE — Discharge Summary (Signed)
Physician Discharge Summary  Caitlin Mcneil VQM:[id number] DOB: 04-15-1956 DOA: 12/23/2014  PCP: [name], MD   Admit date: 12/23/2014 Discharge date: 12/25/2014  Recommendations for Outpatient Follow-up:  1. Psychiatric medications can be resumed per psychiatry recommendations.  Discharge Diagnoses:  Active Problems:   Overdose    Discharge Condition: stable   Diet recommendation: as tolerated   History of present illness:  59 y.o. female with h/o bipolar, remote h/o suicidal attempt in her 34's who presented to [name] with intentional overdose on Ambien. Per her husband, pt apparently took 70-80 tablets of Ambien and pt told her husband she "tried to escape the really". Patient's husband then found her sleeping and not responding.   On admission, the vitals were stable. The 12 lead EKG showed normal sinus rhythm. Blood wokr was notable for mild thrombocytopenia (platelet count 124) otherwise unremarkable. Alcohol level was WNL and UDS positive for benzos.   She was admitted to stepdown unit. Safety sitter at bedside and psych consulted. Transferred to telemetry floor 12/24/14.  Hospital Course:   Assessment/Plan:    Active Problems: Suicidal ideations / Suicidal attempt by overdose / Acute encephalopathy  - Overdose on Ambien in suicidal attempt - The 12-lead EKG showed sinus rhythm on the admission. Patient's blood work was essentially unremarkable at the time of the admission. - Alcohol level was within normal limits. Urine drug screen was positive for benzodiazepines - Continue sitter at bedside - Per psychiatry recommendation, patient would benefit from inpatient behavioral health placement. - Continue psychiatric recommendations per psychiatry recommendations.  Thrombocytopenia - Mild, unclear etiology. - No reports of bleeding.   DVT Prophylaxis  - Lovenox subQ ordered while patient in hospital   Code Status: Full.  Family Communication: Family not at  the bedside    IV access:  Peripheral IV  Procedures and diagnostic studies:   No results found.  Medical Consultants:  Psychiatry   Other Consultants:  None   IAnti-Infectives:   None      Signed:  Leisa Lenz, MD  [facility] 12/25/2014, 9:48 AM  Pager #: [phone]  Time spent in minutes: more than 30 minutes   Discharge Exam: Filed Vitals:   12/25/14 0412  BP: 137/83  Pulse: 62  Temp: 98.2 F (36.8 C)  Resp: 20   Filed Vitals:   12/24/14 1200 12/24/14 1325 12/24/14 2158 12/25/14 0412  BP:  126/78 140/92 137/83  Pulse:  65 70 62  Temp: 98.3 F (36.8 C) 98.3 F (36.8 C) 97.4 F (36.3 C) 98.2 F (36.8 C)  TempSrc: Oral Oral Oral Oral  Resp:  17 18 20   Height:      Weight:      SpO2:  97% 99% 100%    General: Pt is alert, follows commands appropriately, not in acute distress Cardiovascular: Regular rate and rhythm, S1/S2 +, no murmurs Respiratory: Clear to auscultation bilaterally, no wheezing, no crackles, no rhonchi Abdominal: Soft, non tender, non distended, bowel sounds +, no guarding Extremities: no edema, no cyanosis, pulses palpable bilaterally DP and PT Neuro: Grossly nonfocal  Discharge Instructions  Discharge Instructions    Call MD for:  difficulty breathing, headache or visual disturbances    Complete by:  As directed      Call MD for:  persistant nausea and vomiting    Complete by:  As directed      Call MD for:  severe uncontrolled pain    Complete by:  As directed  Diet - low sodium heart healthy    Complete by:  As directed      Discharge instructions    Complete by:  As directed   1. Psychiatric medications can be resumed per psychiatry recommendations.     Increase activity slowly    Complete by:  As directed             Medication List    STOP taking these medications        pseudoephedrine 30 MG tablet  Commonly known as:  SUDAFED     venlafaxine 37.5 MG tablet  Commonly known  as:  EFFEXOR     zolpidem 12.5 MG CR tablet  Commonly known as:  AMBIEN CR      TAKE these medications        acetaminophen 325 MG tablet  Commonly known as:  TYLENOL  Take 1,300 mg by mouth 2 (two) times daily.     cholecalciferol 1000 UNITS tablet  Commonly known as:  VITAMIN D  Take 1,000 Units by mouth daily.     IMODIUM PO  Take 2 tablets by mouth daily as needed.     MINIVELLE 0.1 MG/24HR patch  Generic drug:  estradiol  Place 1 patch onto the skin 2 (two) times a week.     pregabalin 100 MG capsule  Commonly known as:  LYRICA  Take 100 mg by mouth 2 (two) times daily.     temazepam 15 MG capsule  Commonly known as:  RESTORIL  Take 30 mg by mouth at bedtime.     traZODone 50 MG tablet  Commonly known as:  DESYREL  Take 200 mg by mouth at bedtime.     VAGIFEM 10 MCG Tabs vaginal tablet  Generic drug:  Estradiol  Place 1 tablet vaginally daily.     ziprasidone 40 MG capsule  Commonly known as:  GEODON  Take 40 mg by mouth daily with supper.           Follow-up Information    Follow up with [name],[name], MD. Schedule an appointment as soon as possible for a visit in 1 week.   Specialty:  Obstetrics and Gynecology   Why:  Follow up appt after recent hospitalization   Contact information:   [address] [place] [address] [phone]        The results of significant diagnostics from this hospitalization (including imaging, microbiology, ancillary and laboratory) are listed below for reference.    Significant Diagnostic Studies: No results found.  Microbiology: Recent Results (from the past 240 hour(s))  MRSA PCR Screening     Status: None   Collection Time: 12/23/14  5:35 PM  Result Value Ref Range Status   MRSA by PCR NEGATIVE NEGATIVE Final    Comment:        The GeneXpert MRSA Assay (FDA approved for NASAL specimens only), is one component of a comprehensive MRSA colonization surveillance program. It is  not intended to diagnose MRSA infection nor to guide or monitor treatment for MRSA infections.      Labs: Basic Metabolic Panel:  Recent Labs Lab 12/23/14 1340 12/24/14 0345  NA 140 143  K 4.2 4.3  CL 106 109  CO2 28 26  GLUCOSE 104* 82  BUN 21* 19  CREATININE 0.67 0.68  CALCIUM 8.9 8.7*   Liver Function Tests:  Recent Labs Lab 12/23/14 1340  AST 15  ALT 12*  ALKPHOS 44  BILITOT 0.6  PROT 6.7  ALBUMIN 3.9   No results for input(s): LIPASE, AMYLASE in the last 168 hours. No results for input(s): AMMONIA in the last 168 hours. CBC:  Recent Labs Lab 12/23/14 1340 12/24/14 0345  WBC 6.3 9.5  HGB 12.7 12.8  HCT 38.8 39.1  MCV 93.9 94.0  PLT 124* 120*   Cardiac Enzymes: No results for input(s): CKTOTAL, CKMB, CKMBINDEX, TROPONINI in the last 168 hours. BNP: BNP (last 3 results) No results for input(s): BNP in the last 8760 hours.  ProBNP (last 3 results) No results for input(s): PROBNP in the last 8760 hours.  CBG: No results for input(s): GLUCAP in the last 168 hours.

## 2014-12-25 NOTE — Discharge Instructions (Signed)

## 2014-12-25 NOTE — Progress Notes (Signed)
59 year old white female in under the service of Dr. Bary Leriche . Patient has a diagnosis of Bipolar , Anxiety and  Depression . Patient overdosed on Ambien . Patient had an affair , told her husband  Whom walked out on her . Patient then took 70-80 Ambien. Reported to have overdosed in her twenties . Voice of multiple stressors unable to elaborate on them. Patient searched  With no contraband found . Marland Kitchen

## 2014-12-25 NOTE — Progress Notes (Signed)
CSW has been accepted at [facility]. CSW made nurse aware and gave her contact information to call report.  Bed: 305-B  Accepting Physician: [place], [place] ED CSW 12/25/2014 10:52 PM

## 2014-12-25 NOTE — Clinical Social Work Psych Assess (Signed)
Clinical Social Work Environmental manager Social Worker:  Edson Snowball, LCSW Date/Time:  12/25/2014, 3:09 PM Referred By:  Physician Date Referred:  12/25/14 Reason for Referral:  Behavioral Health Issues   Presenting Symptoms/Problems  Presenting Symptoms/Problems(in person's/family's own words):  Pt presented to the hosptial after an intentional overdose. Pt states, "I dealt with all of my thoughts in the wrong way, it was stupid." Patient took 70-80 pills with the intent to end her life after revealing extramarital affair to her husband.   Abuse/Neglect/Trauma History  Abuse/Neglect/Trauma History:  Denies History Abuse/Neglect/Trauma History Comments (indicate dates):     Psychiatric History  Psychiatric History:  Inpatient/Hospitalization, Outpatient Treatment  Psychiatric Medication:     Current Mental Health Hospitalizations/Previous Mental Health History:  Current outpatient psychiatry   Current Provider:  Dr. Marchelle Gearing   Place and Date:  Current Medications:    Previous Inpatient Admission/Date/Reason:  Long time ago.    Emotional Health/Current Symptoms  Suicide/Self Harm: Has a Plan for Suicide, Suicide Attempt in the Past (date/description) Suicide Attempt in Past (date/description):  SI attempt in young 20's, and this admission.   Other Harmful Behavior (ex. homicidal ideation) (describe):  None    Psychotic/Dissociative Symptoms  Psychotic/Dissociative Symptoms: None Reported Other Psychotic/Dissociative Symptoms:     Attention/Behavioral Symptoms  Attention/Behavioral Symptoms: Restless, Withdrawn Other Attention/Behavioral Symptoms:    Cognitive Impairment  Cognitive Impairment:  Within Normal Limits, Orientation - Situation, Orientation - Time, Orientation - Place, Orientation - Self Other Cognitive Impairment:     Mood and Adjustment  Mood and Adjustment:  Flat   Stress, Anxiety, Trauma, Any Recent  Loss/Stressor  Stress, Anxiety, Trauma, Any Recent Loss/Stressor: None Reported Anxiety (frequency):  Phobia (specify):    Compulsive Behavior (specify):    Obsessive Behavior (specify):    Other Stress, Anxiety, Trauma, Any Recent Loss/Stressor:  Pt currently shares taht her stresses include keeping her job, mending her relationship with her husband     Substance Abuse/Use  Substance Abuse/Use: None SBIRT Completed (please refer for detailed history): N/A Self-reported Substance Use (last use and frequency):    Urinary Drug Screen Completed: Yes Alcohol Level:     Environment/Housing/Living Arrangement  Environmental/Housing/Living Arrangement: With Family Member Who is in the Home:  Husband   Emergency Contact:     Financial  Financial: Private Insurance   Patient's Strengths and Goals  Patient's Strengths and Goals (patient's own words):  Patient strengths include a strong support from husband who as bedside. Patient also currently has a psychiatrist and is open to counseling.    Clinical Social Worker's Interpretive Summary  Clinical Social Workers Interpretive Summary:  Patient recommended for inpatient psychiatric treatment due to severity of oversode, flat affect, and still unable to sleep. Patient states she hasn't slept since waking up from the overdose and has anxiety about losing her job while in the hospital. Pt states she is figidity which is minimal and patient is very flat. Pt continues to state, 'believe me I wont do that again however states that her thoughts are not interesting, experessing low self worth.    Disposition Pt accepted to [facility] by Dr. Bary Leriche. CSW awaiting return call for when bed is ready for today. Patient to be transported by Betsy Pries [phone].   Disposition: Inpatient Referral Made [facility], [place])

## 2014-12-25 NOTE — Progress Notes (Signed)
Patient recommended for inpatient psych.  Pt referred to  [facility] pending review.  [place], [place] Work  [employer] [phone]

## 2014-12-25 NOTE — Consult Note (Signed)
Psychiatry Consult follow-up  Reason for Consult:  Intentional drug overdose and social anxiety, bipolar disorder Referring Physician:  Dr. Devine  Patient Identification: Caitlin Mcneil MRN:  [id number] Principal Diagnosis: <principal problem not specified> Diagnosis:   Patient Active Problem List   Diagnosis Date Noted  . Overdose [T50.901A] 12/23/2014  . Hot flashes not due to menopause [R23.2] 03/14/2014  . History of breast cancer [Z85.3] 08/29/2013  . Breast cancer of lower-outer quadrant of right female breast [C50.511] 04/05/2013    Total Time spent with patient: 30 minutes  Subjective:   Caitlin Mcneil is a 58 y.o. female patient admitted with intentional drug overdose.  HPI:  Caitlin Mcneil is a 58 years old married white female admitted to [place] stepdown unit after presenting with intentional overdose of her medication Ambien approximately 70-80 tablets/whole bottle with intention to end her life. Patient reported that she revealed about her extramarital affair to her husband who was upset and left the house for about 20-30 minutes. When husband returned back he found her unresponsive and found empty bottles. Patient reported she thought it is not going to excuse her and not going to come back and concerned about her marriage is not going to work any longer. Patient also reported multiple stresses related to her social anxiety, 8 hours of of working while standing and leg pain second to bunions. Patient has a history of suicidal attempt by overdosing for long time ago when she was in her 20s. Patient has a significant family history of bipolar disorder and completed suicide in her maternal side of the family. Patient cannot contract for safety at this time. Patient reportedly has foot pain, anxiety, depression, insomnia, generalized weakness and being tired. Patient denies nausea, vomiting's a, diarrhea, dizziness.  Psychiatric history. Patient has been receiving outpatient  medication management from Dr. Plovsky at [facility] and has been receiving multiple medication for her bipolar, anxiety and insomnia. Reportedly she has given multiple vacation trials over the years.  Social history: Patient married and living with her husband, has a 30 years old son, for his son and grandson. Patient continued to work in a [employer] over 10 years patient denies drinking alcohol and smoking tobacco and has no known drug abuse versus dependence.  Interval history: Patient seen today for psychiatric consultation follow-up. Case discussed with the patient husband and psychiatric social service. Patient continued to endorse symptoms of depression and suicidal ideation and has mild improvement in her affect and repeatedly regrets for her behavior and intentional overdose. Patient gladly accept inpatient psychiatric hospitalization and appropriate treatment. Patient was accepted by [facility] inpatient [facility] hospitalization.  Past Medical History:  Past Medical History  Diagnosis Date  . Bipolar 1 disorder   . Anxiety   . Depression   . Contact lens/glasses fitting     wears contacts or glasses  . Breast cancer 03/30/13    right breast  . History of radiation therapy 110/10/14-12/26/14    right breast, 60.4Gy    Past Surgical History  Procedure Laterality Date  . Foot surgery  2002    lt hammer toe-reconstruction  . Abdominal hysterectomy  1996  . Wisdom tooth extraction    . Hardware removal  2002    lt  foot hardware out  . Breast lumpectomy with needle localization and axillary sentinel lymph node bx Right 05/02/2013    Procedure: BREAST LUMPECTOMY WITH NEEDLE LOCALIZATION AND AXILLARY SENTINEL LYMPH NODE BX;  Surgeon:   Thomas A. Cornett, MD;  Location: [place];  Service: General;  Laterality: Right;  . Breast biopsy Right 03/30/13    Ductal carcinoma in situ/calcifications,loq   Family History:   Family History  Problem Relation Age of Onset  . Colon cancer Father 77  . Alzheimer's disease Mother   . Alzheimer's disease Maternal Grandmother   . Cancer Maternal Grandfather     Cancer - NOS  . Breast cancer Other     maternal great aunt   Social History:  History  Alcohol Use No     History  Drug Use No    History   Social History  . Marital Status: Married    Spouse Name: N/A  . Number of Children: 1  . Years of Education: N/A   Occupational History  .     Social History Main Topics  . Smoking status: Never Smoker   . Smokeless tobacco: Never Used  . Alcohol Use: No  . Drug Use: No  . Sexual Activity: Not Currently   Other Topics Concern  . None   Social History Narrative   Additional Social History:                          Allergies:   Allergies  Allergen Reactions  . Penicillins Anaphylaxis  . Aspirin Rash    Labs:  Results for orders placed or performed during the hospital encounter of 12/23/14 (from the past 48 hour(s))  Acetaminophen level     Status: Abnormal   Collection Time: 12/23/14  5:27 PM  Result Value Ref Range   Acetaminophen (Tylenol), Serum <10 (L) 10 - 30 ug/mL    Comment:        THERAPEUTIC CONCENTRATIONS VARY SIGNIFICANTLY. A RANGE OF 10-30 ug/mL MAY BE AN EFFECTIVE CONCENTRATION FOR MANY PATIENTS. HOWEVER, SOME ARE BEST TREATED AT CONCENTRATIONS OUTSIDE THIS RANGE. ACETAMINOPHEN CONCENTRATIONS >150 ug/mL AT 4 HOURS AFTER INGESTION AND >50 ug/mL AT 12 HOURS AFTER INGESTION ARE OFTEN ASSOCIATED WITH TOXIC REACTIONS.   TSH     Status: None   Collection Time: 12/23/14  5:27 PM  Result Value Ref Range   TSH 1.004 0.350 - 4.500 uIU/mL  MRSA PCR Screening     Status: None   Collection Time: 12/23/14  5:35 PM  Result Value Ref Range   MRSA by PCR NEGATIVE NEGATIVE    Comment:        The GeneXpert MRSA Assay (FDA approved for NASAL specimens only), is one component of a comprehensive MRSA  colonization surveillance program. It is not intended to diagnose MRSA infection nor to guide or monitor treatment for MRSA infections.   CBC     Status: Abnormal   Collection Time: 12/24/14  3:45 AM  Result Value Ref Range   WBC 9.5 4.0 - 10.5 K/uL   RBC 4.16 3.87 - 5.11 MIL/uL   Hemoglobin 12.8 12.0 - 15.0 g/dL   HCT 39.1 36.0 - 46.0 %   MCV 94.0 78.0 - 100.0 fL   MCH 30.8 26.0 - 34.0 pg   MCHC 32.7 30.0 - 36.0 g/dL   RDW 12.9 11.5 - 15.5 %   Platelets 120 (L) 150 - 400 K/uL  Basic metabolic panel     Status: Abnormal   Collection Time: 12/24/14  3:45 AM  Result Value Ref Range   Sodium 143 135 - 145 mmol/L   Potassium 4.3 3.5 - 5.1 mmol/L   Chloride 109  101 - 111 mmol/L   CO2 26 22 - 32 mmol/L   Glucose, Bld 82 65 - 99 mg/dL   BUN 19 6 - 20 mg/dL   Creatinine, Ser 0.68 0.44 - 1.00 mg/dL   Calcium 8.7 (L) 8.9 - 10.3 mg/dL   GFR calc non Af Amer >60 >60 mL/min   GFR calc Af Amer >60 >60 mL/min    Comment: (NOTE) The eGFR has been calculated using the CKD EPI equation. This calculation has not been validated in all clinical situations. eGFR's persistently <60 mL/min signify possible Chronic Kidney Disease.    Anion gap 8 5 - 15    Vitals: Blood pressure 114/69, pulse 65, temperature 98.1 F (36.7 C), temperature source Oral, resp. rate 18, height 5' 6" (1.676 m), weight 76.4 kg (168 lb 6.9 oz), SpO2 100 %.  Risk to Self: Is patient at risk for suicide?: Yes Risk to Others:   Prior Inpatient Therapy:   Prior Outpatient Therapy:    Current Facility-Administered Medications  Medication Dose Route Frequency Provider Last Rate Last Dose  . acetaminophen (TYLENOL) tablet 650 mg  650 mg Oral Q4H PRN Katherine P Schorr, NP   650 mg at 12/25/14 0236  . cholecalciferol (VITAMIN D) tablet 1,000 Units  1,000 Units Oral Daily Alma M Devine, MD   1,000 Units at 12/25/14 1044  . enoxaparin (LOVENOX) injection 40 mg  40 mg Subcutaneous Q24H Fang Xu, MD   40 mg at 12/24/14 2149   . loperamide (IMODIUM) capsule 2 mg  2 mg Oral Q12H PRN Alma M Devine, MD   2 mg at 12/25/14 1044  . pregabalin (LYRICA) capsule 100 mg  100 mg Oral BID Alma M Devine, MD   100 mg at 12/25/14 1044  . temazepam (RESTORIL) capsule 30 mg  30 mg Oral QHS Alma M Devine, MD   30 mg at 12/24/14 2150    Musculoskeletal: Strength & Muscle Tone: decreased Gait & Station: unable to stand Patient leans: N/A  Psychiatric Specialty Exam: Physical Exam   ROS   Blood pressure 114/69, pulse 65, temperature 98.1 F (36.7 C), temperature source Oral, resp. rate 18, height 5' 6" (1.676 m), weight 76.4 kg (168 lb 6.9 oz), SpO2 100 %.Body mass index is 27.2 kg/(m^2).  General Appearance: Guarded  Eye Contact::  Good  Speech:  Clear and Coherent and Slow  Volume:  Decreased  Mood:  Anxious, Depressed and Hopeless  Affect:  Constricted and Depressed  Thought Process:  Coherent and Goal Directed  Orientation:  Full (Time, Place, and Person)  Thought Content:  Rumination  Suicidal Thoughts:  Yes.  with intent/plan  Homicidal Thoughts:  No  Memory:  Immediate;   Good Recent;   Good Remote;   Good  Judgement:  Impaired  Insight:  Fair  Psychomotor Activity:  Decreased  Concentration:  Fair  Recall:  Fair  Fund of Knowledge:Good  Language: Good  Akathisia:  NA  Handed:  Right  AIMS (if indicated):     Assets:  Communication Skills Desire for Improvement Financial Resources/Insurance Housing Leisure Time Physical Health Resilience Social Support Talents/Skills Transportation Vocational/Educational  ADL's:  Intact  Cognition: WNL  Sleep:      Medical Decision Making: New problem, with additional work up planned, Review of Psycho-Social Stressors (1), Review or order clinical lab tests (1), Discuss test with performing physician (1), Established Problem, Worsening (2), Review of Medication Regimen & Side Effects (2) and Review of New Medication or Change in Dosage (  2)  Treatment Plan  Summary: Patient presented with the status post intentional suicide overdose after had a conflict with her husband regarding extramarital affair. Patient has been suffering with bipolar disorder, depression, social anxiety and insomnia. She has no homicidal ideations or psychotic symptoms. Patient cannot contract for safety.  Daily contact with patient to assess and evaluate symptoms and progress in treatment and Medication management  Plan:  Patient accepted to the [facility] inpatient psychiatric hospitalization which he accepted the patient and her husband for crisis evaluation, safety monitoring and medication management of bipolar disorder.  Discontinue Ambien secondary to recent intentional overdose  Suicide attempt: Safety sitter one-to-one  Patient home medications may restart if appropriate other than Ambien  Recommend psychiatric Inpatient admission when medically cleared. Supportive therapy provided about ongoing stressors.   Appreciate psychiatric consultation and sign off at this time Please contact [phone] or [phone] if needs further assistance  Disposition: Refer to the psychiatric social service regarding inpatient psychiatric placement.  ,JANARDHAHA R. 12/25/2014 4:15 PM Heather Goodkin 

## 2014-12-25 NOTE — Progress Notes (Addendum)
Pt has been accepted to their facility by Dr. Bary Leriche. Please call report to [phone]. CSW informed patient, who is wiling to sign in voluntarily. Patient to be transported by [facility] once bed is available, bed anticipated to be available later this evening. CSW awaiting call back when bed is available.  Belia Heman, [place] Work  [employer] [phone]

## 2014-12-25 NOTE — BH Assessment (Signed)
[facility] Assessment Progress Note  At 13:52 I received a call from [facility] at [facility].  Pt has been accepted to their facility by Dr. Bary Leriche.  Please call report to [phone].  For further details contact pt's Education officer, museum.  Jalene Mullet, MA Triage Specialist [phone]

## 2014-12-26 DIAGNOSIS — F3130 Bipolar disorder, current episode depressed, mild or moderate severity, unspecified: Secondary | ICD-10-CM

## 2014-12-26 MED ORDER — QUETIAPINE FUMARATE 100 MG PO TABS
100.0000 mg | ORAL_TABLET | Freq: Every day | ORAL | Status: DC
  Administered 2014-12-26: 100 mg via ORAL
  Filled 2014-12-26: qty 1

## 2014-12-26 NOTE — Progress Notes (Signed)
Recreation Therapy Notes  Date: 06.01.16 Time: 3:00 pm Location: Craft Room  Group Topic: Self-esteem  Goal Area(s) Addresses:  Patient will write at least one positive trait about self. Patient will list one benefit of having a good self-esteem.  Behavioral Response: Attentive, Interactive  Intervention: I Am  Activity: Patients were given a worksheet with the letter I on it and instructed to list as many positive trait about themselves inside the letter.  Education: LRT educated patient on ways they can increase their self-esteem.  Education Outcome: Acknowledges education/In group clarification offered  Clinical Observations/Feedback: Patient completed activity by filling approximately 25% of letter. Patient contributed to group discussion by stating how it felt to list positive traits.  Leonette Monarch, LRT/CTRS 12/26/2014 4:19 PM

## 2014-12-26 NOTE — Tx Team (Signed)
Initial Interdisciplinary Treatment Plan   PATIENT STRESSORS: Marital or family conflict   PATIENT STRENGTHS: Ability for insight Communication skills General fund of knowledge Motivation for treatment/growth   PROBLEM LIST: Problem List/Patient Goals Date to be addressed Date deferred Reason deferred Estimated date of resolution  Depression 12/25/14     Suicidal Ideation 12/25/14                                                DISCHARGE CRITERIA:  Improved stabilization in mood, thinking, and/or behavior  PRELIMINARY DISCHARGE PLAN: Outpatient therapy  PATIENT/FAMIILY INVOLVEMENT: This treatment plan has been presented to and reviewed with the patient, Caitlin Mcneil, and/or family member.  The patient and family have been given the opportunity to ask questions and make suggestions.  Nash Mantis [facility] 12/26/2014, 6:03 AM

## 2014-12-26 NOTE — Progress Notes (Signed)
Recreation Therapy Notes  INPATIENT RECREATION THERAPY ASSESSMENT  Patient Details Name: ELLINORE MERCED MRN: [id number] DOB: 08-Oct-1955 Today's Date: 12/26/2014  Patient Stressors: Relationship, Work  Coping Skills:   Isolate, Avoidance, Art/Dance, Music, Sports  Personal Challenges: Decision-Making, Expressing Yourself, Problem-Solving, Relationships, Self-Esteem/Confidence, Social Interaction, Stress Management  Leisure Interests (2+):  Individual - Other (Comment) (Sleep, watching golf)  Awareness of Community Resources:  Yes  Community Resources:  Engineer, drilling  Current Use: Yes  If no, Barriers?:    Patient Strengths:  "I love that I am love by my family", meticulous  Patient Identified Areas of Improvement:  Making good decisions, talk to someone sooner rather than later  Current Recreation Participation:  Going to the movies  Patient Goal for Hospitalization:  To get some sleep and start making better decisions  [place] of Residence:  [place] of Residence:  [place]   Current [place] (including self-harm):  No  Current [place]:  No  Consent to Intern Participation: N/A   Leonette Monarch, LRT/CTRS 12/26/2014, 1:45 PM

## 2014-12-26 NOTE — Progress Notes (Signed)
Voice of sleeping poor last night.Stated her appetite is poor, feels as though she is hyper . Concentration good. Stated her depression scale 5 Hopelessness 5 and anxiety 8 (low 0-10 high) Patient denies suicidal ideation interacting with her peers . Attending  unit programming . Appropriate ADL'S and personal chores . Compliant with medication

## 2014-12-26 NOTE — [facility] Group Notes (Signed)
[facility] Group Notes:  (Nursing/MHT/Case Management/Adjunct)  Date:  12/26/2014  Time:  9:20 PM  Type of Therapy:  Group Therapy  Participation Level:  Active  Participation Quality:  Appropriate  Affect:  Appropriate  Cognitive:  Appropriate  Insight:  Good  Engagement in Group:  Engaged  Modes of Intervention:  N/A  Summary of Progress/Problems:  Caitlin Mcneil 12/26/2014, 9:20 PM

## 2014-12-26 NOTE — H&P (Signed)
Psychiatric Admission Assessment Adult  Patient Identification: Caitlin Mcneil MRN:  [id number] Date of Evaluation:  12/26/2014 Chief Complaint:  depression Principal Diagnosis: Bipolar I disorder, most recent episode depressed Diagnosis:   Patient Active Problem List   Diagnosis Date Noted  . Bipolar I disorder, most recent episode depressed [F31.30] 12/25/2014  . Overdose [T50.901A] 12/23/2014  . Hot flashes not due to menopause [R23.2] 03/14/2014  . History of breast cancer [Z85.3] 08/29/2013  . Breast cancer of lower-outer quadrant of right female breast [C50.511] 04/05/2013   History of Present Illness::   Identifying data. Caitlin Mcneil is a 59 year old female with a history of bipolar disorder.  Chief complaint. "I did something stupid."  History of present illness. Caitlin Mcneil has a long history of mental illness. She was diagnosed with bipolar disorder 25 years ago. She's been tried on numerous medications. She has been a patient of Dr. Casimiro Needle in [place]. She has been maintained on Lyrica for depression, anxiety, and mood stabilization. She is taking 3 different sleeping aids for severe insomnia. The patient reports that she has been stable on her regimen. She was brought to the hospital on May 28 after she overdose on Ambien CR. This was in the context of marital conflict. The patient has had extramarital affairs since last fall. She was guilt ridden and told her husband. The husband left the house and the patient was not certain if he were coming back. She impulsively took pills. The husband return and took her to the hospital. Apparently they've been talking and the husband is willing to discuss that there are differences and doesn't participate in couple's therapy. The patient today reports some symptoms of depression with extremely poor sleep, anhedonia, feeling of guilt and hopelessness worthlessness, extremely poor energy and concentration, crying spells. She denies feeling  suicidal or homicidal at present and is able to contract for safety. She denies any psychotic symptoms. She denies symptoms suggestive of bipolar mania although prior to overdose she had several days of total insomnia possibly suggestive of a hypomanic episode. She reports good compliance with medicines. She denies alcohol, illicit drugs, or prescription pill abuse.  Past psychiatric history. As above she was diagnosed with bipolar 25 years ago and hospitalized then. She had 2 prior suicide attempts by medication overdose but never with the amount of medication she consumed the this time. She sees psychiatrist in [place] on a regular basis. She is not therapy.   Family psychiatric history. Multiple family members with mental illness including 3 suicides in her maternal uncles.  Social history. She's been married for over 30 years. 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 She experiences some difficulties at work as she believes she has deformed feet and they hurt after 8 hours of standing. Her husband and was diagnosed with prostate cancer 2 years ago and the has erectile dysfunction as a result of treatment.   Total Time spent with patient: 1 hour  Past Medical History:  Past Medical History  Diagnosis Date  . Bipolar 1 disorder   . Anxiety   . Depression   . Contact lens/glasses fitting     wears contacts or glasses  . Breast cancer 03/30/13    right breast  . History of radiation therapy 110/10/14-12/26/14    right breast, 60.4Gy    Past Surgical History  Procedure Laterality Date  . Foot surgery  2002    lt hammer toe-reconstruction  . Abdominal hysterectomy  1996  . Wisdom tooth  extraction    . Hardware removal  2002    lt  foot hardware out  . Breast lumpectomy with needle localization and axillary sentinel lymph node bx Right 05/02/2013    Procedure: BREAST LUMPECTOMY WITH NEEDLE LOCALIZATION AND AXILLARY SENTINEL LYMPH NODE BX;  Surgeon: Joyice Faster. Cornett, MD;   Location: [facility];  Service: General;  Laterality: Right;  . Breast biopsy Right 03/30/13    Ductal carcinoma in situ/calcifications,loq   Family History:  Family History  Problem Relation Age of Onset  . Colon cancer Father 72  . Alzheimer's disease Mother   . Alzheimer's disease Maternal Grandmother   . Cancer Maternal Grandfather     Cancer - NOS  . Breast cancer Other     maternal great aunt   Social History:  History  Alcohol Use No     History  Drug Use No    History   Social History  . Marital Status: Married    Spouse Name: N/A  . Number of Children: 1  . Years of Education: N/A   Occupational History  .     Social History Main Topics  . Smoking status: Never Smoker   . Smokeless tobacco: Never Used  . Alcohol Use: No  . Drug Use: No  . Sexual Activity: Not Currently   Other Topics Concern  . None   Social History Narrative   Additional Social History:                          Musculoskeletal: Strength & Muscle Tone: within normal limits Gait & Station: normal Patient leans: N/A  Psychiatric Specialty Exam: Physical Exam  Nursing note and vitals reviewed.   Review of Systems  All other systems reviewed and are negative.   Blood pressure 144/84, pulse 71, temperature 98.3 F (36.8 C), temperature source Oral, resp. rate 18, height 5' 3" (1.6 m), weight 73.936 kg (163 lb), SpO2 98 %.Body mass index is 28.88 kg/(m^2).  See SRA.                                                  Sleep:  Number of Hours: 2.5   Risk to Self: Is patient at risk for suicide?: Yes Risk to Others:   Prior Inpatient Therapy:   Prior Outpatient Therapy:    Alcohol Screening: 1. How often do you have a drink containing alcohol?: Never 9. Have you or someone else been injured as a result of your drinking?: No 10. Has a relative or friend or a doctor or another health worker been concerned about your drinking or  suggested you cut down?: No Alcohol Use Disorder Identification Test Final Score (AUDIT): 0 Brief Intervention: AUDIT score less than 7 or less-screening does not suggest unhealthy drinking-brief intervention not indicated  Allergies:   Allergies  Allergen Reactions  . Penicillins Anaphylaxis  . Aspirin Rash   Lab Results: No results found for this or any previous visit (from the past 48 hour(s)). Current Medications: Current Facility-Administered Medications  Medication Dose Route Frequency Provider Last Rate Last Dose  . cholecalciferol (VITAMIN D) tablet 1,000 Units  1,000 Units Oral Daily Clovis Fredrickson, MD   1,000 Units at 12/26/14 1015  . [START ON 12/28/2014] estradiol (CLIMARA - Dosed in mg/24 hr) patch 0.1 mg  0.1 mg Transdermal Weekly Donyea Gafford B Amrie Gurganus, MD      . hydrOXYzine (ATARAX/VISTARIL) tablet 50 mg  50 mg Oral TID PRN Hildred Priest, MD      . pregabalin (LYRICA) capsule 100 mg  100 mg Oral BID Clovis Fredrickson, MD   0 mg at 12/25/14 2136  . QUEtiapine (SEROQUEL) tablet 100 mg  100 mg Oral QHS Jae Bruck B Verania Salberg, MD       PTA Medications: Prescriptions prior to admission  Medication Sig Dispense Refill Last Dose  . cholecalciferol (VITAMIN D) 1000 UNITS tablet Take 1,000 Units by mouth daily.   12/23/2014 at Unknown time  . MINIVELLE 0.1 MG/24HR patch Place 1 patch onto the skin 2 (two) times a week.    12/21/2014 at unknown  . pregabalin (LYRICA) 100 MG capsule Take 100 mg by mouth 2 (two) times daily.   12/23/2014 at Unknown time    Previous Psychotropic Medications: Yes   Substance Abuse History in the last 12 months:  No.    Consequences of Substance Abuse: NA  Results for orders placed or performed during the hospital encounter of 12/23/14 (from the past 72 hour(s))  Acetaminophen level     Status: None   Collection Time: 12/23/14  1:39 PM  Result Value Ref Range   Acetaminophen (Tylenol), Serum 10 10 - 30 ug/mL    Comment:         THERAPEUTIC CONCENTRATIONS VARY SIGNIFICANTLY. A RANGE OF 10-30 ug/mL MAY BE AN EFFECTIVE CONCENTRATION FOR MANY PATIENTS. HOWEVER, SOME ARE BEST TREATED AT CONCENTRATIONS OUTSIDE THIS RANGE. ACETAMINOPHEN CONCENTRATIONS >150 ug/mL AT 4 HOURS AFTER INGESTION AND >50 ug/mL AT 12 HOURS AFTER INGESTION ARE OFTEN ASSOCIATED WITH TOXIC REACTIONS.   Ethanol (ETOH)     Status: None   Collection Time: 12/23/14  1:39 PM  Result Value Ref Range   Alcohol, Ethyl (B) <5 <5 mg/dL    Comment:        LOWEST DETECTABLE LIMIT FOR SERUM ALCOHOL IS 11 mg/dL FOR MEDICAL PURPOSES ONLY   Salicylate level     Status: None   Collection Time: 12/23/14  1:39 PM  Result Value Ref Range   Salicylate Lvl <0.0 2.8 - 30.0 mg/dL  CBC     Status: Abnormal   Collection Time: 12/23/14  1:40 PM  Result Value Ref Range   WBC 6.3 4.0 - 10.5 K/uL   RBC 4.13 3.87 - 5.11 MIL/uL   Hemoglobin 12.7 12.0 - 15.0 g/dL   HCT 38.8 36.0 - 46.0 %   MCV 93.9 78.0 - 100.0 fL   MCH 30.8 26.0 - 34.0 pg   MCHC 32.7 30.0 - 36.0 g/dL   RDW 12.6 11.5 - 15.5 %   Platelets 124 (L) 150 - 400 K/uL  Comprehensive metabolic panel     Status: Abnormal   Collection Time: 12/23/14  1:40 PM  Result Value Ref Range   Sodium 140 135 - 145 mmol/L   Potassium 4.2 3.5 - 5.1 mmol/L   Chloride 106 101 - 111 mmol/L   CO2 28 22 - 32 mmol/L   Glucose, Bld 104 (H) 65 - 99 mg/dL   BUN 21 (H) 6 - 20 mg/dL   Creatinine, Ser 0.67 0.44 - 1.00 mg/dL   Calcium 8.9 8.9 - 10.3 mg/dL   Total Protein 6.7 6.5 - 8.1 g/dL   Albumin 3.9 3.5 - 5.0 g/dL   AST 15 15 - 41 U/L   ALT 12 (L) 14 - 54 U/L  Alkaline Phosphatase 44 38 - 126 U/L   Total Bilirubin 0.6 0.3 - 1.2 mg/dL   GFR calc non Af Amer >60 >60 mL/min   GFR calc Af Amer >60 >60 mL/min    Comment: (NOTE) The eGFR has been calculated using the CKD EPI equation. This calculation has not been validated in all clinical situations. eGFR's persistently <60 mL/min signify possible Chronic  Kidney Disease.    Anion gap 6 5 - 15  Acetaminophen level     Status: Abnormal   Collection Time: 12/23/14  1:51 PM  Result Value Ref Range   Acetaminophen (Tylenol), Serum <10 (L) 10 - 30 ug/mL    Comment:        THERAPEUTIC CONCENTRATIONS VARY SIGNIFICANTLY. A RANGE OF 10-30 ug/mL MAY BE AN EFFECTIVE CONCENTRATION FOR MANY PATIENTS. HOWEVER, SOME ARE BEST TREATED AT CONCENTRATIONS OUTSIDE THIS RANGE. ACETAMINOPHEN CONCENTRATIONS >150 ug/mL AT 4 HOURS AFTER INGESTION AND >50 ug/mL AT 12 HOURS AFTER INGESTION ARE OFTEN ASSOCIATED WITH TOXIC REACTIONS.   Urine Drug Screen     Status: Abnormal   Collection Time: 12/23/14  2:12 PM  Result Value Ref Range   Opiates NONE DETECTED NONE DETECTED   Cocaine NONE DETECTED NONE DETECTED   Benzodiazepines POSITIVE (A) NONE DETECTED   Amphetamines NONE DETECTED NONE DETECTED   Tetrahydrocannabinol NONE DETECTED NONE DETECTED   Barbiturates NONE DETECTED NONE DETECTED    Comment:        DRUG SCREEN FOR MEDICAL PURPOSES ONLY.  IF CONFIRMATION IS NEEDED FOR ANY PURPOSE, NOTIFY LAB WITHIN 5 DAYS.        LOWEST DETECTABLE LIMITS FOR URINE DRUG SCREEN Drug Class       Cutoff (ng/mL) Amphetamine      1000 Barbiturate      200 Benzodiazepine   778 Tricyclics       242 Opiates          300 Cocaine          300 THC              50   Acetaminophen level     Status: Abnormal   Collection Time: 12/23/14  5:27 PM  Result Value Ref Range   Acetaminophen (Tylenol), Serum <10 (L) 10 - 30 ug/mL    Comment:        THERAPEUTIC CONCENTRATIONS VARY SIGNIFICANTLY. A RANGE OF 10-30 ug/mL MAY BE AN EFFECTIVE CONCENTRATION FOR MANY PATIENTS. HOWEVER, SOME ARE BEST TREATED AT CONCENTRATIONS OUTSIDE THIS RANGE. ACETAMINOPHEN CONCENTRATIONS >150 ug/mL AT 4 HOURS AFTER INGESTION AND >50 ug/mL AT 12 HOURS AFTER INGESTION ARE OFTEN ASSOCIATED WITH TOXIC REACTIONS.   TSH     Status: None   Collection Time: 12/23/14  5:27 PM  Result Value Ref  Range   TSH 1.004 0.350 - 4.500 uIU/mL  MRSA PCR Screening     Status: None   Collection Time: 12/23/14  5:35 PM  Result Value Ref Range   MRSA by PCR NEGATIVE NEGATIVE    Comment:        The GeneXpert MRSA Assay (FDA approved for NASAL specimens only), is one component of a comprehensive MRSA colonization surveillance program. It is not intended to diagnose MRSA infection nor to guide or monitor treatment for MRSA infections.   CBC     Status: Abnormal   Collection Time: 12/24/14  3:45 AM  Result Value Ref Range   WBC 9.5 4.0 - 10.5 K/uL   RBC 4.16 3.87 - 5.11 MIL/uL  Hemoglobin 12.8 12.0 - 15.0 g/dL   HCT 39.1 36.0 - 46.0 %   MCV 94.0 78.0 - 100.0 fL   MCH 30.8 26.0 - 34.0 pg   MCHC 32.7 30.0 - 36.0 g/dL   RDW 12.9 11.5 - 15.5 %   Platelets 120 (L) 150 - 400 K/uL  Basic metabolic panel     Status: Abnormal   Collection Time: 12/24/14  3:45 AM  Result Value Ref Range   Sodium 143 135 - 145 mmol/L   Potassium 4.3 3.5 - 5.1 mmol/L   Chloride 109 101 - 111 mmol/L   CO2 26 22 - 32 mmol/L   Glucose, Bld 82 65 - 99 mg/dL   BUN 19 6 - 20 mg/dL   Creatinine, Ser 0.68 0.44 - 1.00 mg/dL   Calcium 8.7 (L) 8.9 - 10.3 mg/dL   GFR calc non Af Amer >60 >60 mL/min   GFR calc Af Amer >60 >60 mL/min    Comment: (NOTE) The eGFR has been calculated using the CKD EPI equation. This calculation has not been validated in all clinical situations. eGFR's persistently <60 mL/min signify possible Chronic Kidney Disease.    Anion gap 8 5 - 15    Observation Level/Precautions:  15 minute checks  Laboratory:  CBC Chemistry Profile UDS UA  Psychotherapy:    Medications:    Consultations:    Discharge Concerns:    Estimated LOS:  Other:     Psychological Evaluations: No   Treatment Plan Summary: Daily contact with patient to assess and evaluate symptoms and progress in treatment and Medication management  Medical Decision Making:  New problem, with additional work up planned,  Review of Psycho-Social Stressors (1), Review or order clinical lab tests (1), Review of Medication Regimen & Side Effects (2) and Review of New Medication or Change in Dosage (2)   Ms. Mcneil is a 59 year old female with a history of bipolar illness admitted after a suicide attempt by overdose on multiple pills of Ambien in the context of marital conflict.  1. Suicidal ideation. The patient is glad to be alive. She is able to contract for safety in the hospital.  2. Mood. The patient reports that she is been maintained on Lyrica for depression and anxiety. This is prescribed by her primary psychiatrist Dr. Casimiro Needle. Over the years she has been tried on multiple medications and does not feel that there were any benefits. In particular she had problems with lithium. She does not remember any medicine that she consider beneficial.  3. Insomnia. The patient suffers severe insomnia and she has been maintained on 3 different sleeping aids Restoril 30 mg nightly Ambien CR 12.5 mg nightly and trazodone 100 mg. Even when she takes alternately she has not been able to sleep. Prior to her overdose she had several nights of paninsomnia. She was prescribed Restoril 30 mg last night but did not sleep at all. We will start Seroquel for insomnia and mood stabilization.  4. Anxiety. We will continue Lyrica 100 mg twice daily.  5. Social. The patient disclosed to her husband that she is having extramarital affair. The husband is coming tonight to see the patient. At the probably will need a family session. The patient and her husband are willing to enter couples therapy following discharge.  6. Disposition. She will be discharged to home. She will follow up with Dr. Casimiro Needle.   I certify that inpatient services furnished can reasonably be expected to improve the patient's condition.     Sojourner Behringer 6/1/201611:30 AM

## 2014-12-26 NOTE — [facility] Suicide Risk Assessment (Signed)
Caitlin Mcneil Admission Suicide Risk Assessment   Nursing information obtained from:  Patient Demographic factors:  Caucasian, Unemployed Current Mental Status:  NA Loss Factors:  NA Historical Factors:  Prior suicide attempts, Family history of suicide Risk Reduction Factors:  Positive social support, Living with another person, especially a relative Total Time spent with patient: 1 hour Principal Problem: Bipolar I disorder, most recent episode depressed Diagnosis:   Patient Active Problem List   Diagnosis Date Noted  . Bipolar I disorder, most recent episode depressed [F31.30] 12/25/2014  . Bipolar I disorder [F31.9] 12/25/2014  . Major depression [F32.2] 12/25/2014  . Overdose [T50.901A] 12/23/2014  . Hot flashes not due to menopause [R23.2] 03/14/2014  . History of breast cancer [Z85.3] 08/29/2013  . Breast cancer of lower-outer quadrant of right female breast [C50.511] 04/05/2013     Continued Clinical Symptoms:  Alcohol Use Disorder Identification Test Final Score (AUDIT): 0 The "Alcohol Use Disorders Identification Test", Guidelines for Use in Primary Care, Second Edition.  World Pharmacologist [facility]). Score between 0-7:  no or low risk or alcohol related problems. Score between 8-15:  moderate risk of alcohol related problems. Score between 16-19:  high risk of alcohol related problems. Score 20 or above:  warrants further diagnostic evaluation for alcohol dependence and treatment.   CLINICAL FACTORS:   Bipolar Disorder:   Depressive phase   Musculoskeletal: Strength & Muscle Tone: within normal limits Gait & Station: normal Patient leans: N/A  Psychiatric Specialty Exam: Physical Exam  Nursing note and vitals reviewed. Constitutional: She is oriented to person, place, and time. She appears well-developed and well-nourished.  HENT:  Head: Normocephalic and atraumatic.  Eyes: Conjunctivae and EOM are normal. Pupils are equal, round, and reactive to light.  Neck:  Normal range of motion. Neck supple.  Cardiovascular: Normal rate and regular rhythm.   Respiratory: Effort normal and breath sounds normal.  GI: Soft. Bowel sounds are normal.  Musculoskeletal: Normal range of motion.  Neurological: She is alert and oriented to person, place, and time.  Skin: Skin is warm and dry.    Review of Systems  All other systems reviewed and are negative.   Blood pressure 144/84, pulse 71, temperature 98.3 F (36.8 C), temperature source Oral, resp. rate 18, height 5\' 3"  (1.6 m), weight 73.936 kg (163 lb), SpO2 98 %.Body mass index is 28.88 kg/(m^2).  General Appearance: Casual  Eye Contact::  Fair  Speech:  Normal Rate  Volume:  Decreased  Mood:  Depressed  Affect:  Blunt  Thought Process:  Goal Directed  Orientation:  Full (Time, Place, and Person)  Thought Content:  WDL  Suicidal Thoughts:  No  Homicidal Thoughts:  No  Memory:  Immediate;   Fair Recent;   Fair Remote;   Fair  Judgement:  Poor  Insight:  Shallow  Psychomotor Activity:  Decreased  Concentration:  Fair  Recall:  [place]: Fair  Akathisia:  No  Handed:  Right  AIMS (if indicated):     Assets:  Communication Skills Desire for Improvement Financial Resources/Insurance Housing Physical Health Social Support Vocational/Educational  Sleep:  Number of Hours: 2.5  Cognition: WNL  ADL's:  Intact     COGNITIVE FEATURES THAT CONTRIBUTE TO RISK:  None    SUICIDE RISK:   Moderate:  Frequent suicidal ideation with limited intensity, and duration, some specificity in terms of plans, no associated intent, good self-control, limited dysphoria/symptomatology, some risk factors present, and identifiable protective factors, including available  and accessible social support.  PLAN OF CARE: Hospitalization, medication management, suicide precautions, family session, discharge planning.  Medical Decision Making:  New problem, with additional work up planned,  Review of Psycho-Social Stressors (1), Review or order clinical lab tests (1), Review of Medication Regimen & Side Effects (2) and Review of New Medication or Change in Dosage (2)  Caitlin Mcneil is a 59 year old female with a history of bipolar illness admitted after a suicide attempt by overdose on multiple pills of Ambien in the context of marital conflict.  1. Suicidal ideation. The patient is glad to be alive. She is able to contract for safety in the hospital.  2. Mood. The patient reports that she is been maintained on Lyrica for depression and anxiety. This is prescribed by her primary psychiatrist Dr. Casimiro Needle. Over the years she has been tried on multiple medications and does not feel that there were any benefits. In particular she had problems with lithium. She does not remember any medicine that she consider beneficial.  3. Insomnia. The patient suffers severe insomnia and she has been maintained on 3 different sleeping aids Restoril 30 mg nightly Ambien CR 12.5 mg nightly and trazodone possibly 100 mg. Even when she takes alternately she has not been able to sleep. Prior to her overdose she had several nights of paninsomnia. She was prescribed Restoril 30 mg last night but did not sleep at all. We will start Seroquel for insomnia and mood stabilization.  4. Anxiety. We will continue Lyrica 100 mg twice daily.  5. Social. The patient disclosed to her husband that she is having extramarital affair. The husband is coming tonight to see the patient. At the probably will need a family session. The patient and her husband are willing to enter couples therapy following discharge.  6. Disposition. She will be discharged to home. She will follow up with Dr. Casimiro Needle.   I certify that inpatient services furnished can reasonably be expected to improve the patient's condition.   Caitlin Mcneil 12/26/2014, 11:01 AM

## 2014-12-26 NOTE — Plan of Care (Signed)
Problem: Diagnosis: Increased Risk For Suicide Attempt Goal: LTG-Patient Will Show Positive Response to Medication LTG (by discharge) : Patient will show positive response to medication and will participate in the development of the discharge plan.  Outcome: Progressing Positive attitude observed , interacting with unit

## 2014-12-27 MED ORDER — HYDROXYZINE HCL 50 MG PO TABS: 50.0000 mg | ORAL_TABLET | Freq: Three times a day (TID) | ORAL | Status: DC | PRN

## 2014-12-27 MED ORDER — QUETIAPINE FUMARATE 100 MG PO TABS: 100.0000 mg | ORAL_TABLET | Freq: Every day | ORAL | Status: AC

## 2014-12-27 NOTE — [facility] Suicide Risk Assessment (Signed)
[facility] INPATIENT:  Family/Significant Other Suicide Prevention Education  Suicide Prevention Education:  Education Completed; Laira Penninger (husband) [phone] has been identified by the patient as the family member/significant other with whom the patient will be residing, and identified as the person(s) who will aid the patient in the event of a mental health crisis (suicidal ideations/suicide attempt).  With written consent from the patient, the family member/significant other has been provided the following suicide prevention education, prior to the and/or following the discharge of the patient.  The suicide prevention education provided includes the following:  Suicide risk factors  Suicide prevention and interventions  National Suicide Hotline telephone number  [facility] assessment telephone number  [facility] Emergency Assistance [place] and/or Residential Mobile Crisis Unit telephone number  Request made of family/significant other to:  Remove weapons (e.g., guns, rifles, knives), all items previously/currently identified as safety concern.    Remove drugs/medications (over-the-counter, prescriptions, illicit drugs), all items previously/currently identified as a safety concern.  The family member/significant other verbalizes understanding of the suicide prevention education information provided.  The family member/significant other agrees to remove the items of safety concern listed above.  Carmell Austria T 12/27/2014, 12:05 PM

## 2014-12-27 NOTE — BHH Suicide Risk Assessment (Signed)
[facility] Discharge Suicide Risk Assessment   Demographic Factors:  Caucasian  Total Time spent with patient: 30 minutes  Musculoskeletal: Strength & Muscle Tone: within normal limits Gait & Station: normal Patient leans: N/A  Psychiatric Specialty Exam: Physical Exam  Nursing note and vitals reviewed.   Review of Systems  Gastrointestinal: Positive for diarrhea.  All other systems reviewed and are negative.   Blood pressure 144/84, pulse 71, temperature 98.3 F (36.8 C), temperature source Oral, resp. rate 18, height 5\' 3"  (1.6 m), weight 73.936 kg (163 lb), SpO2 98 %.Body mass index is 28.88 kg/(m^2).  General Appearance: Casual  Eye Contact::  Good  Speech:  Normal Rate409  Volume:  Normal  Mood:  Euthymic  Affect:  Appropriate  Thought Process:  Goal Directed and Logical  Orientation:  Full (Time, Place, and Person)  Thought Content:  WDL  Suicidal Thoughts:  No  Homicidal Thoughts:  No  Memory:  Immediate;   Fair Recent;   Fair Remote;   Fair  Judgement:  Fair  Insight:  Fair  Psychomotor Activity:  Normal  Concentration:  Fair  Recall:  [employer] [place]  Language: Fair  Akathisia:  No  Handed:  Right  AIMS (if indicated):     Assets:  Communication Skills Desire for Improvement Financial Resources/Insurance Housing Physical Health Resilience Social Support Vocational/Educational  Sleep:  Number of Hours: 6.25  Cognition: WNL  ADL's:  Intact   Have you used any form of tobacco in the last 30 days? (Cigarettes, Smokeless Tobacco, Cigars, and/or Pipes): No  Has this patient used any form of tobacco in the last 30 days? (Cigarettes, Smokeless Tobacco, Cigars, and/or Pipes) No  Mental Status Per Nursing Assessment::   On Admission:  NA  Current Mental Status by Physician: NA  Loss Factors: Loss of significant relationship  Historical Factors: Prior suicide attempts and Impulsivity  Risk Reduction Factors:   Sense of responsibility to  family, Employed, Living with another person, especially a relative, Positive social support and Positive therapeutic relationship  Continued Clinical Symptoms:  Bipolar Disorder:   Depressive phase  Cognitive Features That Contribute To Risk:  None    Suicide Risk:  Minimal: No identifiable suicidal ideation.  Patients presenting with no risk factors but with morbid ruminations; may be classified as minimal risk based on the severity of the depressive symptoms  Principal Problem: Bipolar I disorder, most recent episode depressed Discharge Diagnoses:  Patient Active Problem List   Diagnosis Date Noted  . Bipolar I disorder, most recent episode depressed [F31.30] 12/25/2014  . Overdose [T50.901A] 12/23/2014  . Hot flashes not due to menopause [R23.2] 03/14/2014  . History of breast cancer [Z85.3] 08/29/2013  . Breast cancer of lower-outer quadrant of right female breast [C50.511] 04/05/2013      Plan Of Care/Follow-up recommendations:  Activity:  As tolerated Diet:  Low sodium heart healthy Other:  Keep follow-up appointments  Is patient on multiple antipsychotic therapies at discharge:  No   Has Patient had three or more failed trials of antipsychotic monotherapy by history:  No  Recommended Plan for Multiple Antipsychotic Therapies: NA    Jolanta Pucilowska 12/27/2014, 10:12 AM

## 2014-12-27 NOTE — Progress Notes (Signed)
Recreation Therapy Notes  INPATIENT RECREATION TR PLAN  Patient Details Name: Caitlin Mcneil MRN: [id number] DOB: Aug 26, 1955 Today's Date: 12/27/2014  Rec Therapy Plan Is patient appropriate for Therapeutic Recreation?: Yes Treatment times per week: At least 3 times a week TR Treatment/Interventions: 1:1 session, Group participation (Comment) (Appropriate participation in daily recreation therapy tx)  Discharge Criteria Pt will be discharged from therapy if:: Discharged Treatment plan/goals/alternatives discussed and agreed upon by:: Patient/family  Discharge Summary Short term goals set: See Care Plan Short term goals met: Complete Progress toward goals comments: One-to-one attended Which groups?: Self-esteem One-to-one attended: Self-esteem, Stress management, Decision making Reason goals not met: N/A Therapeutic equipment acquired: None Reason patient discharged from therapy: Discharge from hospital Pt/family agrees with progress & goals achieved: Yes Date patient discharged from therapy: 12/27/14   Leonette Monarch, LRT/CTRS 12/27/2014, 12:11 PM

## 2014-12-27 NOTE — Progress Notes (Signed)
Patient discharged ambulatory to care of husband. She denies SI or HI. Discharge instructions were reviewed with patient, she verbalizes understanding. Patient received copy of discharge instructions, prescriptions, and all personal belongings.

## 2014-12-27 NOTE — Progress Notes (Signed)
D: Patient denies SI/HI/AVH. Patient affect is depressed. Mood is anxious.  Patient did attend evening group. Patient visible on the milieu. No distress noted. A: Support and encouragement offered. Scheduled medications given to pt. Q 15 min checks continued for patient safety. R: Patient receptive. Patient remains safe on the unit.

## 2014-12-27 NOTE — Plan of Care (Signed)
Problem: [facility] Participation in Recreation Therapeutic Interventions Goal: STG-Patient will demonstrate improved self esteem by identif STG: Self-Esteem - Within 3 treatment sessions, patient will verbalize at least 5 positive affirmation statements in one treatment session to increase self-esteem post d/c.  Outcome: Completed/Met Date Met:  12/27/14 Treatment Session 1; Completed 1 out of 1: At approximately 9:15 am, LRT met with patient in craft room. Patient verbalized 5 positive affirmation statements. Patient reported it felt "slightly positive". LRT encouraged patient to continue saying positive affirmation statements.  Leonette Monarch, LRT/CTRS 06.02.16 12:06 pm Goal: STG-Other Recreation Therapy Goal (Specify) STG: Stress Management - Within 3 treatment sessions, patient will verbalize understanding of stress management techniques in one treatment session to increase stress management skills post d/c.  Outcome: Completed/Met Date Met:  12/27/14 Treatment Session 1; Completed 1 out of 1: At approximately 9:15 am, LRT met with patient in craft room. LRT educated and provided patient with handouts on stress management techniques. Patient verbalized understanding of stress management techniques. LRT encouraged patient to read over and practice the stress management techniques.  Leonette Monarch, LRT/CTRS 06.02.16 12:08 pm  Problem: [facility] Participation in Recreation Therapeutic Interventions Goal: STG-Other Recreation Therapy Goal (Specify) STG; Decision Making - Within 3 treatment sessions, patient will verbalize understanding of decision making chart in one treatment session to increase decision making skills post d/c.  Outcome: Completed/Met Date Met:  12/27/14 Treatment Session 1; Completed 1 out of 1; At approximately 11:55 am, LRT met with patient in hallway. LRT explained decision making chart to patient. Patient verbalized understanding.   Leonette Monarch, LRT/CTRS 06.02.16  12:09 pm

## 2014-12-27 NOTE — Progress Notes (Signed)
  [facility] Adult Case Management Discharge Plan :  Will you be returning to the same living situation after discharge:  Yes,  with husband At discharge, do you have transportation home?: Yes,  patient has transportation to and from appointment and husband will provide transportation home after family meeting Do you have the ability to pay for your medications: Yes,  patient has insurance for medications  Release of information consent forms completed and in the chart;  Patient's signature needed at discharge.  Patient to Follow up at: Follow-up Information    Follow up with [facility] In 1 day.   Why:  For follow-up care; at 1:45pm   Contact information:   [address] [place], [place] Ph [phone] Fax [phone]      Patient denies SI/HI: Yes,  patient reports no SI/HI    Safety Planning and Suicide Prevention discussed: Yes,  SPE discussed with patient and family and given brochure with SPE material  Have you used any form of tobacco in the last 30 days? (Cigarettes, Smokeless Tobacco, Cigars, and/or Pipes): No  Has patient been referred to the Quitline?: N/A patient is not a smoker  Carmell Austria T 12/27/2014, 12:06 PM

## 2014-12-27 NOTE — Discharge Summary (Signed)
Physician Discharge Summary Note  Patient:  Caitlin Mcneil is an 59 y.o., female MRN:  [id number] DOB:  [birth year] Patient phone:  [phone] (home)  Patient address:   [place] [address],  Total Time spent with patient: 30 minutes  Date of Admission:  12/25/2014 Date of Discharge: 12/27/2014    Reason for Admission:  Overdose on Ambien.  Identifying data. Caitlin Mcneil is a 59 year old female with a history of bipolar disorder.  Chief complaint. "I did something stupid."  History of present illness. Caitlin Mcneil has a long history of mental illness. She was diagnosed with bipolar disorder 25 years ago. She's been tried on numerous medications. She has been a patient of Dr. Casimiro Needle in [place]. She has been maintained on Lyrica for depression, anxiety, and mood stabilization. She is taking 3 different sleeping aids for severe insomnia. The patient reports that she has been stable on her regimen. She was brought to the hospital on May 28 after she overdose on Ambien CR. This was in the context of marital conflict. The patient has had extramarital affairs since last fall. She was guilt ridden and told her husband. The husband left the house and the patient was not certain if he were coming back. She impulsively took pills. The husband return and took her to the hospital. Apparently they've been talking and the husband is willing to discuss that there are differences and doesn't participate in couple's therapy. The patient today reports some symptoms of depression with extremely poor sleep, anhedonia, feeling of guilt and hopelessness worthlessness, extremely poor energy and concentration, crying spells. She denies feeling suicidal or homicidal at present and is able to contract for safety. She denies any psychotic symptoms. She denies symptoms suggestive of bipolar mania although prior to overdose she had several days of total insomnia possibly suggestive of a hypomanic episode. She  reports good compliance with medicines. She denies alcohol, illicit drugs, or prescription pill abuse.  Past psychiatric history. As above she was diagnosed with bipolar 25 years ago and hospitalized then. She had 2 prior suicide attempts by medication overdose but never with the amount of medication she consumed the this time. She sees psychiatrist in [place] on a regular basis. She is not therapy.   Family psychiatric history. Multiple family members with mental illness including 3 suicides in her maternal uncles.  Social history. She's been married for over 30 years. 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 She experiences some difficulties at work as she believes she has deformed feet and they hurt after 8 hours of standing. Her husband and was diagnosed with prostate cancer 2 years ago and the has erectile dysfunction as a result of treatment.   Principal Problem: Bipolar I disorder, most recent episode depressed Discharge Diagnoses: Patient Active Problem List   Diagnosis Date Noted  . Bipolar I disorder, most recent episode depressed [F31.30] 12/25/2014  . Overdose [T50.901A] 12/23/2014  . Hot flashes not due to menopause [R23.2] 03/14/2014  . History of breast cancer [Z85.3] 08/29/2013  . Breast cancer of lower-outer quadrant of right female breast [C50.511] 04/05/2013    Musculoskeletal: Strength & Muscle Tone: within normal limits Gait & Station: normal Patient leans: N/A  Psychiatric Specialty Exam: Physical Exam  Nursing note and vitals reviewed.   Review of Systems  Gastrointestinal: Positive for diarrhea.  All other systems reviewed and are negative.   Blood pressure 144/84, pulse 71, temperature 98.3 F (36.8 C), temperature source Oral, resp. rate  18, height 5\' 3"  (1.6 m), weight 73.936 kg (163 lb), SpO2 98 %.Body mass index is 28.88 kg/(m^2).  See SRA.                                                  Sleep:  Number of Hours:  6.25   Have you used any form of tobacco in the last 30 days? (Cigarettes, Smokeless Tobacco, Cigars, and/or Pipes): No  Has this patient used any form of tobacco in the last 30 days? (Cigarettes, Smokeless Tobacco, Cigars, and/or Pipes) No  Past Medical History:  Past Medical History  Diagnosis Date  . Bipolar 1 disorder   . Anxiety   . Depression   . Contact lens/glasses fitting     wears contacts or glasses  . Breast cancer 03/30/13    right breast  . History of radiation therapy 110/10/14-12/26/14    right breast, 60.4Gy    Past Surgical History  Procedure Laterality Date  . Foot surgery  2002    lt hammer toe-reconstruction  . Abdominal hysterectomy  1996  . Wisdom tooth extraction    . Hardware removal  2002    lt  foot hardware out  . Breast lumpectomy with needle localization and axillary sentinel lymph node bx Right 05/02/2013    Procedure: BREAST LUMPECTOMY WITH NEEDLE LOCALIZATION AND AXILLARY SENTINEL LYMPH NODE BX;  Surgeon: Joyice Faster. Cornett, MD;  Location: [facility];  Service: General;  Laterality: Right;  . Breast biopsy Right 03/30/13    Ductal carcinoma in situ/calcifications,loq   Family History:  Family History  Problem Relation Age of Onset  . Colon cancer Father 2  . Alzheimer's disease Mother   . Alzheimer's disease Maternal Grandmother   . Cancer Maternal Grandfather     Cancer - NOS  . Breast cancer Other     maternal great aunt   Social History:  History  Alcohol Use No     History  Drug Use No    History   Social History  . Marital Status: Married    Spouse Name: N/A  . Number of Children: 1  . Years of Education: N/A   Occupational History  .     Social History Main Topics  . Smoking status: Never Smoker   . Smokeless tobacco: Never Used  . Alcohol Use: No  . Drug Use: No  . Sexual Activity: Not Currently   Other Topics Concern  . None   Social History Narrative    Past Psychiatric  History: Hospitalizations:  Outpatient Care:  Substance Abuse Care:  Self-Mutilation:  Suicidal Attempts:  Violent Behaviors:   Risk to Self: Is patient at risk for suicide?: Yes Risk to Others:   Prior Inpatient Therapy:   Prior Outpatient Therapy:    Level of Care:  OP  Hospital Course:    Caitlin Mcneil is a 59 year old female with a history of bipolar illness admitted after a suicide attempt by overdose on multiple capsules of Ambien CR n the context of marital conflict.  1. Suicidal ideation. This has resolved. The patient is able to contract for safety.   2. Mood. The patient reports that she is been maintained on Lyrica for depression and anxiety. This is prescribed by her primary psychiatrist Dr. Casimiro Needle. Over the years she has been tried on multiple medications and does  not feel that there were any benefits. In particular she had problems with lithium. She does not remember any medicine that she consider beneficial.  3. Insomnia. The patient suffers severe insomnia and she has been maintained on 3 different sleeping aids Restoril 30 mg nightly Ambien CR 12.5 mg nightly and trazodone 100 mg. Even when she takes alternately she has not been able to sleep. Prior to her overdose she had several nights of paninsomnia.  We  started Seroquel for insomnia and mood stabilization.  4. Anxiety. We will continue Lyrica 100 mg twice daily.  5. Social. Family meeting was completed prior to discharge.   6. Disposition. She will be discharged to home with her husband. She will follow up with Dr. Casimiro Needle.  Consults:  None  Significant Diagnostic Studies:  None  Discharge Vitals:   Blood pressure 144/84, pulse 71, temperature 98.3 F (36.8 C), temperature source Oral, resp. rate 18, height 5\' 3"  (1.6 m), weight 73.936 kg (163 lb), SpO2 98 %. Body mass index is 28.88 kg/(m^2). Lab Results:   No results found for this or any previous visit (from the past 72 hour(s)).  Physical  Findings: AIMS: Facial and Oral Movements Muscles of Facial Expression: None, normal Lips and Perioral Area: None, normal Jaw: None, normal Tongue: None, normal,Extremity Movements Upper (arms, wrists, hands, fingers): None, normal Lower (legs, knees, ankles, toes): None, normal, Trunk Movements Neck, shoulders, hips: None, normal, Overall Severity Severity of abnormal movements (highest score from questions above): None, normal Incapacitation due to abnormal movements: None, normal Patient's awareness of abnormal movements (rate only patient's report): No Awareness, Dental Status Current problems with teeth and/or dentures?: No Does patient usually wear dentures?: No  CIWA:    COWS:      See Psychiatric Specialty Exam and Suicide Risk Assessment completed by Attending Physician prior to discharge.  Discharge destination:  Home  Is patient on multiple antipsychotic therapies at discharge:  No   Has Patient had three or more failed trials of antipsychotic monotherapy by history:  No    Recommended Plan for Multiple Antipsychotic Therapies: NA  Discharge Instructions    Diet - low sodium heart healthy    Complete by:  As directed      Increase activity slowly    Complete by:  As directed             Medication List    TAKE these medications      Indication   cholecalciferol 1000 UNITS tablet  Commonly known as:  VITAMIN D  Take 1,000 Units by mouth daily.      hydrOXYzine 50 MG tablet  Commonly known as:  ATARAX/VISTARIL  Take 1 tablet (50 mg total) by mouth 3 (three) times daily as needed for anxiety.   Indication:  Anxiety Neurosis     MINIVELLE 0.1 MG/24HR patch  Generic drug:  estradiol  Place 1 patch onto the skin 2 (two) times a week.      pregabalin 100 MG capsule  Commonly known as:  LYRICA  Take 100 mg by mouth 2 (two) times daily.      QUEtiapine 100 MG tablet  Commonly known as:  SEROQUEL  Take 1 tablet (100 mg total) by mouth at bedtime.    Indication:  Manic Phase of Manic-Depression         Follow-up recommendations:  Activity:  As tolerated Diet:  Low sodium heart healthy Other:  Keep follow-up appointments  Comments:    Total Discharge Time: 40  min.  Signed: Shelly Shoultz 12/27/2014, 10:18 AM

## 2014-12-27 NOTE — Progress Notes (Signed)
AVS H&P Discharge Summary faxed to [facility]

## 2015-03-19 ENCOUNTER — Other Ambulatory Visit: Payer: Self-pay | Admitting: Obstetrics & Gynecology

## 2015-03-20 LAB — CYTOLOGY - PAP

## 2015-03-25 ENCOUNTER — Ambulatory Visit
Admission: RE | Admit: 2015-03-25 | Discharge: 2015-03-25 | Disposition: A | Discharge status: Home / Self Care | Payer: BLUE CROSS/BLUE SHIELD | Source: Ambulatory Visit | Attending: Obstetrics & Gynecology | Admitting: Obstetrics & Gynecology

## 2015-03-25 DIAGNOSIS — Z853 Personal history of malignant neoplasm of breast: Secondary | ICD-10-CM

## 2015-08-12 ENCOUNTER — Telehealth: Payer: Self-pay | Admitting: Hematology and Oncology

## 2015-08-12 NOTE — Telephone Encounter (Signed)
Patient called in and left a message to r/s her appt due to jury duty and to leave a message with a new appointment for 3/13-3/17,done     Webb Silversmith

## 2015-09-19 ENCOUNTER — Ambulatory Visit: Payer: BLUE CROSS/BLUE SHIELD | Admitting: Hematology and Oncology

## 2015-10-08 NOTE — Assessment & Plan Note (Signed)
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 Mammogram 03/25/2015 is normal, density C 2. Breast exam 10/09/15 is normal  Survivorship:Discussed the importance of physical exercise in decreasing the likelihood of breast cancer recurrence. Recommended 30 mins daily 6 days a week of either brisk walking or cycling or swimming. Encouraged patient to eat more fruits and vegetables and decrease red meat.   RTC in 1 year

## 2015-10-09 ENCOUNTER — Encounter: Payer: Self-pay | Admitting: Hematology and Oncology

## 2015-10-09 ENCOUNTER — Ambulatory Visit (HOSPITAL_BASED_OUTPATIENT_CLINIC_OR_DEPARTMENT_OTHER): Payer: BLUE CROSS/BLUE SHIELD | Admitting: Hematology and Oncology

## 2015-10-09 VITALS — BP 131/76 | HR 78 | Temp 97.6°F | Resp 18 | Ht 63.0 in | Wt 193.4 lb

## 2015-10-09 DIAGNOSIS — C50511 Malignant neoplasm of lower-outer quadrant of right female breast: Secondary | ICD-10-CM

## 2015-10-09 NOTE — Progress Notes (Signed)
Patient Care Team: [name], MD as PCP - General (Obstetrics and Gynecology)  DIAGNOSIS: Breast cancer of lower-outer quadrant of right female breast [facility])   Staging form: Breast, AJCC 7th Edition     Clinical: Stage 0 (Tis (DCIS), N0, cM0) - Unsigned       Staging comments: Staged at breast conference 04/05/13.      Pathologic: No stage assigned - Unsigned  SUMMARY OF ONCOLOGIC HISTORY:   Breast cancer of lower-outer quadrant of right female breast ([place])   05/02/2013 Surgery Right breast lumpectomy: Tumor size 0.35 cm, intermediate grade, 3 sentinel nodes negative, ER/PR HER-2 negative, T1 N0 M0 stage IA   06/06/2013 - 07/21/2013 Radiation Therapy Adjuvant radiation therapy    CHIEF COMPLIANT: ANNUAL follow-up of right breast cancer  INTERVAL HISTORY: Caitlin Mcneil is a 60 year old with above-mentioned history of right breast cancer who had lumpectomy followed by adjuvant radiation and had triple negative disease and did not require systemic chemotherapy. She is currently on surveillance. She reports no major problems or concerns. She denies any lumps or nodules in the breast. She works full-time and states on her feet all day. She does not have any time for exercise.  REVIEW OF SYSTEMS:   Constitutional: Denies fevers, chills or abnormal weight loss Eyes: Denies blurriness of vision Ears, nose, mouth, throat, and face: Denies mucositis or sore throat Respiratory: Denies cough, dyspnea or wheezes Cardiovascular: Denies palpitation, chest discomfort Gastrointestinal:  Denies nausea, heartburn or change in bowel habits Skin: Denies abnormal skin rashes Lymphatics: Denies new lymphadenopathy or easy bruising Neurological:Denies numbness, tingling or new weaknesses Behavioral/Psych: Mood is stable, no new changes  Extremities: No lower extremity edema Breast:  denies any pain or lumps or nodules in either breasts All other systems were reviewed with the patient and are  negative.  I have reviewed the past medical history, past surgical history, social history and family history with the patient and they are unchanged from previous note.  ALLERGIES:  is allergic to penicillins and aspirin.  MEDICATIONS:  Current Outpatient Prescriptions  Medication Sig Dispense Refill  . cholecalciferol (VITAMIN D) 1000 UNITS tablet Take 1,000 Units by mouth daily.    . hydrOXYzine (ATARAX/VISTARIL) 50 MG tablet Take 1 tablet (50 mg total) by mouth 3 (three) times daily as needed for anxiety. 90 tablet 0  . MINIVELLE 0.1 MG/24HR patch Place 1 patch onto the skin 2 (two) times a week.     . pregabalin (LYRICA) 100 MG capsule Take 100 mg by mouth 2 (two) times daily.    . QUEtiapine (SEROQUEL) 100 MG tablet Take 1 tablet (100 mg total) by mouth at bedtime. 30 tablet 0   No current facility-administered medications for this visit.    PHYSICAL EXAMINATION: ECOG PERFORMANCE STATUS: 0 - Asymptomatic  Filed Vitals:   10/09/15 1517  BP: 131/76  Pulse: 78  Temp: 97.6 F (36.4 C)  Resp: 18   Filed Weights   10/09/15 1517  Weight: 193 lb 6.4 oz (87.726 kg)    GENERAL:alert, no distress and comfortable SKIN: skin color, texture, turgor are normal, no rashes or significant lesions EYES: normal, Conjunctiva are pink and non-injected, sclera clear OROPHARYNX:no exudate, no erythema and lips, buccal mucosa, and tongue normal  NECK: supple, thyroid normal size, non-tender, without nodularity LYMPH:  no palpable lymphadenopathy in the cervical, axillary or inguinal LUNGS: clear to auscultation and percussion with normal breathing effort HEART: regular rate & rhythm and no murmurs and no lower extremity  edema ABDOMEN:abdomen soft, non-tender and normal bowel sounds MUSCULOSKELETAL:no cyanosis of digits and no clubbing  NEURO: alert & oriented x 3 with fluent speech, no focal motor/sensory deficits EXTREMITIES: No lower extremity edema BREAST: No palpable masses or nodules  in either right or left breasts. No palpable axillary supraclavicular or infraclavicular adenopathy no breast tenderness or nipple discharge. (exam performed in the presence of a chaperone)  LABORATORY DATA:  I have reviewed the data as listed   Chemistry      Component Value Date/Time   NA 143 12/24/2014 0345   NA 141 09/18/2014 1342   K 4.3 12/24/2014 0345   K 4.1 09/18/2014 1342   CL 109 12/24/2014 0345   CO2 26 12/24/2014 0345   CO2 27 09/18/2014 1342   BUN 19 12/24/2014 0345   BUN 17.3 09/18/2014 1342   CREATININE 0.68 12/24/2014 0345   CREATININE 0.7 09/18/2014 1342      Component Value Date/Time   CALCIUM 8.7* 12/24/2014 0345   CALCIUM 9.0 09/18/2014 1342   ALKPHOS 44 12/23/2014 1340   ALKPHOS 48 09/18/2014 1342   AST 15 12/23/2014 1340   AST 16 09/18/2014 1342   ALT 12* 12/23/2014 1340   ALT 9 09/18/2014 1342   BILITOT 0.6 12/23/2014 1340   BILITOT 0.80 09/18/2014 1342       Lab Results  Component Value Date   WBC 9.5 12/24/2014   HGB 12.8 12/24/2014   HCT 39.1 12/24/2014   MCV 94.0 12/24/2014   PLT 120* 12/24/2014   NEUTROABS 6.3 09/18/2014   ASSESSMENT & PLAN:  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 Mammogram 03/25/2015 is normal, density C 2. Breast exam 10/09/15 is normal  Survivorship:Discussed the importance of physical exercise in decreasing the likelihood of breast cancer recurrence. Recommended 30 mins daily 6 days a week of either brisk walking or cycling or swimming. Encouraged patient to eat more fruits and vegetables and decrease red meat.   She wishes to follow with her gynecologist for annual checkups. She will call us if she has any problems or concerns.   No orders of the defined types were placed in this  encounter.   The patient has a good understanding of the overall plan. she agrees with it. she will call with any problems that may develop before the next visit here.   Rulon Eisenmenger, MD 10/09/2015

## 2015-12-24 ENCOUNTER — Other Ambulatory Visit: Payer: Self-pay | Admitting: Obstetrics & Gynecology

## 2015-12-24 ENCOUNTER — Other Ambulatory Visit: Payer: Self-pay

## 2015-12-24 DIAGNOSIS — Z853 Personal history of malignant neoplasm of breast: Secondary | ICD-10-CM

## 2016-03-26 ENCOUNTER — Ambulatory Visit
Admission: RE | Admit: 2016-03-26 | Discharge: 2016-03-26 | Disposition: A | Discharge status: Home / Self Care | Payer: BLUE CROSS/BLUE SHIELD | Source: Ambulatory Visit | Attending: Obstetrics & Gynecology | Admitting: Obstetrics & Gynecology

## 2016-03-26 DIAGNOSIS — Z853 Personal history of malignant neoplasm of breast: Secondary | ICD-10-CM

## 2017-01-11 ENCOUNTER — Other Ambulatory Visit: Payer: Self-pay | Admitting: Obstetrics & Gynecology

## 2017-01-11 DIAGNOSIS — Z853 Personal history of malignant neoplasm of breast: Secondary | ICD-10-CM

## 2017-04-16 ENCOUNTER — Other Ambulatory Visit: Payer: Self-pay | Admitting: Obstetrics & Gynecology

## 2017-04-16 ENCOUNTER — Ambulatory Visit
Admission: RE | Admit: 2017-04-16 | Discharge: 2017-04-16 | Disposition: A | Discharge status: Home / Self Care | Payer: BLUE CROSS/BLUE SHIELD | Source: Ambulatory Visit | Attending: Obstetrics & Gynecology | Admitting: Obstetrics & Gynecology

## 2017-04-16 DIAGNOSIS — Z853 Personal history of malignant neoplasm of breast: Secondary | ICD-10-CM

## 2017-04-16 HISTORY — DX: Personal history of irradiation: Z92.3

## 2018-02-09 ENCOUNTER — Other Ambulatory Visit: Payer: Self-pay | Admitting: Obstetrics & Gynecology

## 2018-02-09 DIAGNOSIS — Z9889 Other specified postprocedural states: Secondary | ICD-10-CM

## 2018-05-09 ENCOUNTER — Ambulatory Visit
Admission: RE | Admit: 2018-05-09 | Discharge: 2018-05-09 | Disposition: A | Discharge status: Home / Self Care | Payer: BLUE CROSS/BLUE SHIELD | Source: Ambulatory Visit | Attending: Obstetrics & Gynecology | Admitting: Obstetrics & Gynecology

## 2018-05-09 DIAGNOSIS — Z9889 Other specified postprocedural states: Secondary | ICD-10-CM

## 2019-03-14 ENCOUNTER — Other Ambulatory Visit: Payer: Self-pay | Admitting: Obstetrics & Gynecology

## 2019-03-14 DIAGNOSIS — Z1231 Encounter for screening mammogram for malignant neoplasm of breast: Secondary | ICD-10-CM

## 2019-05-15 ENCOUNTER — Ambulatory Visit
Admission: RE | Admit: 2019-05-15 | Discharge: 2019-05-15 | Disposition: A | Discharge status: Home / Self Care | Payer: BC Managed Care – PPO | Source: Ambulatory Visit | Attending: Obstetrics & Gynecology | Admitting: Obstetrics & Gynecology

## 2019-05-15 ENCOUNTER — Other Ambulatory Visit: Payer: Self-pay

## 2019-05-15 DIAGNOSIS — Z1231 Encounter for screening mammogram for malignant neoplasm of breast: Secondary | ICD-10-CM

## 2020-02-12 ENCOUNTER — Other Ambulatory Visit: Payer: Self-pay | Admitting: Obstetrics & Gynecology

## 2020-02-12 DIAGNOSIS — Z1231 Encounter for screening mammogram for malignant neoplasm of breast: Secondary | ICD-10-CM

## 2020-05-13 ENCOUNTER — Ambulatory Visit: Payer: BC Managed Care – PPO

## 2020-06-24 ENCOUNTER — Other Ambulatory Visit: Payer: Self-pay

## 2020-06-24 ENCOUNTER — Ambulatory Visit
Admission: RE | Admit: 2020-06-24 | Discharge: 2020-06-24 | Disposition: A | Discharge status: Home / Self Care | Payer: No Typology Code available for payment source | Source: Ambulatory Visit | Attending: Obstetrics & Gynecology | Admitting: Obstetrics & Gynecology

## 2020-06-24 DIAGNOSIS — Z1231 Encounter for screening mammogram for malignant neoplasm of breast: Secondary | ICD-10-CM

## 2021-02-27 ENCOUNTER — Other Ambulatory Visit: Payer: Self-pay | Admitting: Obstetrics & Gynecology

## 2021-02-27 DIAGNOSIS — Z1231 Encounter for screening mammogram for malignant neoplasm of breast: Secondary | ICD-10-CM

## 2021-05-15 ENCOUNTER — Other Ambulatory Visit: Payer: Self-pay | Admitting: Obstetrics & Gynecology

## 2021-05-21 ENCOUNTER — Other Ambulatory Visit: Payer: Self-pay | Admitting: Obstetrics & Gynecology

## 2021-05-21 DIAGNOSIS — N6320 Unspecified lump in the left breast, unspecified quadrant: Secondary | ICD-10-CM

## 2021-06-06 ENCOUNTER — Other Ambulatory Visit: Payer: Self-pay | Admitting: Obstetrics & Gynecology

## 2021-06-06 ENCOUNTER — Ambulatory Visit
Admission: RE | Admit: 2021-06-06 | Discharge: 2021-06-06 | Disposition: A | Discharge status: Home / Self Care | Payer: No Typology Code available for payment source | Source: Ambulatory Visit | Attending: Obstetrics & Gynecology | Admitting: Obstetrics & Gynecology

## 2021-06-06 DIAGNOSIS — N6002 Solitary cyst of left breast: Secondary | ICD-10-CM

## 2021-06-06 DIAGNOSIS — N6489 Other specified disorders of breast: Secondary | ICD-10-CM

## 2021-06-06 DIAGNOSIS — N6320 Unspecified lump in the left breast, unspecified quadrant: Secondary | ICD-10-CM

## 2021-06-30 ENCOUNTER — Ambulatory Visit: Payer: No Typology Code available for payment source

## 2021-07-02 ENCOUNTER — Other Ambulatory Visit: Payer: Self-pay | Admitting: Obstetrics & Gynecology

## 2021-07-02 ENCOUNTER — Ambulatory Visit
Admission: RE | Admit: 2021-07-02 | Discharge: 2021-07-02 | Disposition: A | Discharge status: Home / Self Care | Payer: No Typology Code available for payment source | Source: Ambulatory Visit | Attending: Obstetrics & Gynecology | Admitting: Obstetrics & Gynecology

## 2021-07-02 DIAGNOSIS — N6320 Unspecified lump in the left breast, unspecified quadrant: Secondary | ICD-10-CM

## 2021-07-02 DIAGNOSIS — N6002 Solitary cyst of left breast: Secondary | ICD-10-CM

## 2021-07-02 DIAGNOSIS — N6489 Other specified disorders of breast: Secondary | ICD-10-CM

## 2021-07-03 ENCOUNTER — Other Ambulatory Visit: Payer: Self-pay

## 2021-07-03 ENCOUNTER — Ambulatory Visit: Payer: No Typology Code available for payment source | Attending: Internal Medicine | Admitting: Audiologist

## 2021-07-03 DIAGNOSIS — H9313 Tinnitus, bilateral: Secondary | ICD-10-CM | POA: Insufficient documentation

## 2021-07-03 DIAGNOSIS — H903 Sensorineural hearing loss, bilateral: Secondary | ICD-10-CM | POA: Diagnosis present

## 2021-07-03 NOTE — Procedures (Signed)
Outpatient Audiology and [place], [place]  [address] [phone]  AUDIOLOGICAL  EVALUATION  NAME: ACCALIA RIGDON     DOB:   [birth year]      MRN: [id number]                                                                                     DATE: 07/03/2021     REFERENT: [name], MD STATUS: Outpatient DIAGNOSIS: Tinnitus, Sensorineural Hearing Loss Bilateral     History: Lachanda was seen for an audiological evaluation.  Leana is receiving a hearing evaluation due to concerns for two tinnitus sounds. Mardelle has no difficulty hearing. For the last month she has been hearing cricket sounds and a pulsing sound at the back of her head that also feels like it is vibrating. No pain or pressure reported in either ear.  Tinnitus present in both ears off and on. It is more bothersome at night in the quiet and when she first wakes up. Aurea has a history of noise exposure from working next to loud machinery. She has her hearing checked by [employer] annually due to noise level exposure. Amor also has recent diagnoses of diabetes and breat cancer. She is on several medications for her mental health. Eldora has a family history of hearing loss. Her mother had a loss that was described as "due to the bones in her middle ear". Alis also just had impacted cerumen removed from both her ears by Dr. Koleen Nimrod.She also reports she recently had an ear infection which she treated with drops.  Medical history positive for several conditions which is a risk factor for hearing loss. No other relevant case history reported.   Evaluation:  Otoscopy showed a clear view of the tympanic membranes, bilaterally White debris present bilaterally  but this is likely due to use of Debrox drops which turn wax white.  Tympanometry results were consistent with normal middle ear pressure, bilaterally   Audiometric testing was completed using conventional audiometry with insert and supraural  transducer. Speech Recognition Thresholds were consistent with pure tone averages. Word Recognition was good at 40dB SL bilaterally. Pure tone thresholds show  normal hearing with a mild  sensorineural hearing loss in both ears. Test results are consistent with a noise notch showing los is due to exposure to excessive levels of noise bilaterally.   Results:  The test results were reviewed with Marcie Bal. She does not have a degree of hearing loss that warrants hearing aids. We discussed the role of stress in the perception of tinnitus. Recommended she use a noise machine at night to help mask the tinnitus. The tinnitus is likely due to significant stress her body is under. The body's fight or flight response has manifested as the tinnitus. She reported understanding.   Recommendations: Use of Bose Sleep Buds or the MyNoise App is recommended to mask tinnitus when needed.  Use of masker should be at night and when in quiet environments where the tinnitus is loud or when around triggering sound. Masker should be played at lowest level possible that provides relief from tinnitus.  Earplugs only recommended when around continuous exposure to  truly loud sound, such as at work. Not recommended for every day use.    Alfonse Alpers  Audiologist, Au.D., CCC-A 07/03/2021  2:45 PM  Cc: [name], MD

## 2021-07-04 ENCOUNTER — Telehealth: Payer: Self-pay | Admitting: Hematology and Oncology

## 2021-07-04 NOTE — Telephone Encounter (Signed)
Scheduled appt per 12/8 staff msg from nurse navigators. Called pt, no answer. Left msg with appt date and time. Requested that pt call me back to confirm appt.

## 2021-07-13 DIAGNOSIS — C50012 Malignant neoplasm of nipple and areola, left female breast: Secondary | ICD-10-CM | POA: Insufficient documentation

## 2021-07-13 NOTE — Assessment & Plan Note (Signed)
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 Mammogram 06/06/21 : Left Breast Palpable lesion: 3.2 cm cyst, 2 adj masses 0.6 cm, and 0.7 cm Biopsy 07/02/21: ILC Grade 1, ER: 90%, PR 100%, Her 2: Neg, Ki 67: 2%  Plan: 1. Breast conserving surgery with SLN biopsy 2. Adj XRT 3. Adj Anti estrogen therapy  RTC after surgery

## 2021-07-13 NOTE — Progress Notes (Signed)
[facility] NOTE  Patient Care Team: [name], MD as PCP - General (Obstetrics and Gynecology)  CHIEF COMPLAINTS/PURPOSE OF CONSULTATION:  Newly diagnosed right breast cancer  HISTORY OF PRESENTING ILLNESS:  Caitlin Mcneil 65 y.o. female is here because of recent diagnosis of invasive mammary carcinoma of the right breast. Diagnostic mammogram on 06/06/2021 showed palpable lump in the left breast corresponds with a 3.2 cm benign cyst, and 2 masses at 12 in 1 o'clock, 1 cm from the nipple, which may represent either complicated cysts or fibroadenomas. Biopsy on 07/02/2021 showed grade I/II invasive mammary carcinoma with lobular neoplasia, ER+(90%)/PR+(100%)/Her2-. She has a history of right breast cancer for which she underwent right lumpectomy and radiation therapy in 2014. She presents to the clinic today for initial evaluation and discussion of treatment options.   I reviewed her records extensively and collaborated the history with the patient.  SUMMARY OF ONCOLOGIC HISTORY: Oncology History  Breast cancer of lower-outer quadrant of right female breast ([place])  05/02/2013 Surgery   Right breast lumpectomy: Tumor size 0.35 cm, intermediate grade, 3 sentinel nodes negative, ER/PR HER-2 negative, T1 N0 M0 stage IA   06/06/2013 - 07/21/2013 Radiation Therapy   Adjuvant radiation therapy   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, estrogen receptor positive ([place])  07/02/2021 Initial Diagnosis   Left Breast Palpable lesion: 3.2 cm cyst, 2 adj masses 0.6 cm, and 0.7 cm Biopsy 07/02/21: ILC Grade 1, ER: 90%, PR 100%, Her 2: Neg, Ki 67: 2%     MEDICAL HISTORY:  Past Medical History:  Diagnosis Date   Anxiety    Bipolar 1 disorder ([place])    Breast cancer ([place]) 03/30/13   right breast   Contact lens/glasses fitting    wears contacts or glasses   Depression    History of radiation therapy 110/10/14-12/26/14   right breast,  60.4Gy   Personal history of radiation therapy 2014   right breast    SURGICAL HISTORY: Past Surgical History:  Procedure Laterality Date   ABDOMINAL HYSTERECTOMY  1996   BREAST BIOPSY Right 03/30/13   Ductal carcinoma in situ/calcifications,loq   BREAST LUMPECTOMY Right 05/02/2013   BREAST LUMPECTOMY WITH NEEDLE LOCALIZATION AND AXILLARY SENTINEL LYMPH NODE BX Right 05/02/2013   Procedure: BREAST LUMPECTOMY WITH NEEDLE LOCALIZATION AND AXILLARY SENTINEL LYMPH NODE BX;  Surgeon: Joyice Faster. Cornett, MD;  Location: [facility];  Service: General;  Laterality: Right;   FOOT SURGERY  2002   lt hammer toe-reconstruction   HARDWARE REMOVAL  2002   lt  foot hardware out   WISDOM TOOTH EXTRACTION      SOCIAL HISTORY: Social History   Socioeconomic History   Marital status: Married    Spouse name: Not on file   Number of children: 1   Years of education: Not on file   Highest education level: Not on file  Occupational History    Employer: [employer]  Tobacco Use   Smoking status: Never   Smokeless tobacco: Never  Substance and Sexual Activity   Alcohol use: No   Drug use: No   Sexual activity: Not Currently  Other Topics Concern   Not on file  Social History Narrative   Not on file   Social Determinants of Health   Financial Resource Strain: Not on file  Food Insecurity: Not on file  Transportation Needs: Not on file  Physical Activity: Not on file  Stress: Not on  file  Social Connections: Not on file  Intimate Partner Violence: Not on file    FAMILY HISTORY: Family History  Problem Relation Age of Onset   Colon cancer Father 5   Alzheimer's disease Mother    Alzheimer's disease Maternal Grandmother    Cancer Maternal Grandfather        Cancer - NOS   Breast cancer Other        maternal great aunt    ALLERGIES:  is allergic to carbamazepine, penicillins, aspirin, ciprofloxacin, and ibuprofen.  MEDICATIONS:  Current Outpatient Medications   Medication Sig Dispense Refill   cholecalciferol (VITAMIN D) 1000 UNITS tablet Take 1,000 Units by mouth daily.     hydrOXYzine (ATARAX/VISTARIL) 50 MG tablet Take 1 tablet (50 mg total) by mouth 3 (three) times daily as needed for anxiety. 90 tablet 0   MINIVELLE 0.1 MG/24HR patch Place 1 patch onto the skin 2 (two) times a week.      pregabalin (LYRICA) 100 MG capsule Take 100 mg by mouth 2 (two) times daily.     QUEtiapine (SEROQUEL) 100 MG tablet Take 1 tablet (100 mg total) by mouth at bedtime. 30 tablet 0   No current facility-administered medications for this visit.    REVIEW OF SYSTEMS:   Constitutional: Denies fevers, chills or abnormal night sweats Eyes: Denies blurriness of vision, double vision or watery eyes Ears, nose, mouth, throat, and face: Denies mucositis or sore throat Respiratory: Denies cough, dyspnea or wheezes Cardiovascular: Denies palpitation, chest discomfort or lower extremity swelling Gastrointestinal:  Denies nausea, heartburn or change in bowel habits Skin: Denies abnormal skin rashes Lymphatics: Denies new lymphadenopathy or easy bruising Neurological:Denies numbness, tingling or new weaknesses Behavioral/Psych: Mood is stable, no new changes  Breast:  Denies any palpable lumps or discharge All other systems were reviewed with the patient and are negative.  PHYSICAL EXAMINATION: ECOG PERFORMANCE STATUS: 1 - Symptomatic but completely ambulatory  Vitals:   07/14/21 1551  BP: (!) 156/78  Pulse: 73  Resp: 18  Temp: 97.6 F (36.4 C)  SpO2: 100%   Filed Weights   07/14/21 1551  Weight: 196 lb 9 oz (89.2 kg)      LABORATORY DATA:  I have reviewed the data as listed Lab Results  Component Value Date   WBC 9.5 12/24/2014   HGB 12.8 12/24/2014   HCT 39.1 12/24/2014   MCV 94.0 12/24/2014   PLT 120 (L) 12/24/2014   Lab Results  Component Value Date   NA 143 12/24/2014   K 4.3 12/24/2014   CL 109 12/24/2014   CO2 26 12/24/2014     RADIOGRAPHIC STUDIES: I have personally reviewed the radiological reports and agreed with the findings in the report.  ASSESSMENT AND PLAN:  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, estrogen receptor positive ([place]) 07/02/2021: Left Breast Palpable lesion: 3.2 cm cyst, 2 adj masses 0.6 cm, and 0.7 cm Biopsy 07/02/21: ILC Grade 1, ER: 90%, PR 100%, Her 2: Neg, Ki 67: 2%  (Prior history of right breast cancer status postlumpectomy: 0.35 cm intermediate grade IDC, 0/3 lymph nodes, triple negative, stage Ia followed by adjuvant radiation)  Pathology and radiology counseling:Discussed with the patient, the details of pathology including the type of breast cancer,the clinical staging, the significance of ER, PR and HER-2/neu receptors and the implications for treatment. After reviewing the pathology in detail, we proceeded to discuss the different treatment options between surgery, radiation, chemotherapy, antiestrogen therapies.  Recommendations: 1.  Breast conserving surgery followed by 2. Oncotype DX testing depending on final pathologic size and grade 3. Adjuvant radiation therapy followed by 4. Adjuvant antiestrogen therapy I instructed her that she needs to taper off and discontinue the estradiol patches.  She will cut the patches to once a week initially and then slowly come off of it over the next 2 months.  She takes Seroquel which might help prevent hot flashes.   Return to clinic after surgery to discuss final pathology report and then determine if Oncotype DX testing will need to be sent.    All questions were answered. The patient knows to call the clinic with any problems, questions or concerns.   Rulon Eisenmenger, MD, MPH 07/14/2021    I, Thana Ates, am acting as scribe for Nicholas Lose, MD.  I have reviewed the above documentation for accuracy and completeness, and I agree with the above.

## 2021-07-14 ENCOUNTER — Ambulatory Visit: Payer: Self-pay | Admitting: Surgery

## 2021-07-14 ENCOUNTER — Other Ambulatory Visit: Payer: Self-pay

## 2021-07-14 ENCOUNTER — Inpatient Hospital Stay
Payer: No Typology Code available for payment source | Attending: Hematology and Oncology | Admitting: Hematology and Oncology

## 2021-07-14 DIAGNOSIS — Z803 Family history of malignant neoplasm of breast: Secondary | ICD-10-CM | POA: Diagnosis not present

## 2021-07-14 DIAGNOSIS — Z853 Personal history of malignant neoplasm of breast: Secondary | ICD-10-CM | POA: Diagnosis not present

## 2021-07-14 DIAGNOSIS — Z170 Estrogen receptor positive status [ER+]: Secondary | ICD-10-CM | POA: Diagnosis not present

## 2021-07-14 DIAGNOSIS — C50012 Malignant neoplasm of nipple and areola, left female breast: Secondary | ICD-10-CM | POA: Diagnosis not present

## 2021-07-14 DIAGNOSIS — Z923 Personal history of irradiation: Secondary | ICD-10-CM | POA: Insufficient documentation

## 2021-07-14 DIAGNOSIS — C50912 Malignant neoplasm of unspecified site of left female breast: Secondary | ICD-10-CM

## 2021-07-14 MED ORDER — TEMAZEPAM 15 MG PO CAPS: 15.0000 mg | ORAL_CAPSULE | Freq: Every evening | ORAL | 0 refills | Status: AC | PRN

## 2021-07-14 NOTE — Assessment & Plan Note (Signed)
07/02/2021: Left Breast Palpable lesion: 3.2 cm cyst, 2 adj masses 0.6 cm, and 0.7 cm Biopsy 07/02/21: ILC Grade 1, ER: 90%, PR 100%, Her 2: Neg, Ki 67: 2%  (Prior history of right breast cancer status postlumpectomy: 0.35 cm intermediate grade IDC, 0/3 lymph nodes, triple negative, stage Ia followed by adjuvant radiation)  Pathology and radiology counseling:Discussed with the patient, the details of pathology including the type of breast cancer,the clinical staging, the significance of ER, PR and HER-2/neu receptors and the implications for treatment. After reviewing the pathology in detail, we proceeded to discuss the different treatment options between surgery, radiation, chemotherapy, antiestrogen therapies.  Recommendations: 1. Breast conserving surgery followed by 2. Oncotype DX testing depending on final pathologic size and grade 3. Adjuvant radiation therapy followed by 4. Adjuvant antiestrogen therapy  Oncotype counseling: I discussed Oncotype DX test. I explained to the patient that this is a 21 gene panel to evaluate patient tumors DNA to calculate recurrence score. This would help determine whether patient has high risk or low risk breast cancer. She understands that if her tumor was found to be high risk, she would benefit from systemic chemotherapy. If low risk, no need of chemotherapy.  Return to clinic after surgery to discuss final pathology report and then determine if Oncotype DX testing will need to be sent.

## 2021-07-15 ENCOUNTER — Telehealth: Payer: Self-pay | Admitting: *Deleted

## 2021-07-15 ENCOUNTER — Encounter: Payer: Self-pay | Admitting: *Deleted

## 2021-07-15 NOTE — Telephone Encounter (Signed)
Left message with husband for a return phone call to follow up from new patient appt and assess navigation needs.

## 2021-07-16 ENCOUNTER — Other Ambulatory Visit: Payer: Self-pay | Admitting: Surgery

## 2021-07-16 DIAGNOSIS — C50812 Malignant neoplasm of overlapping sites of left female breast: Secondary | ICD-10-CM

## 2021-07-17 ENCOUNTER — Encounter: Payer: Self-pay | Admitting: *Deleted

## 2021-07-17 ENCOUNTER — Other Ambulatory Visit: Payer: Self-pay | Admitting: Surgery

## 2021-07-17 DIAGNOSIS — C50912 Malignant neoplasm of unspecified site of left female breast: Secondary | ICD-10-CM

## 2021-07-18 ENCOUNTER — Telehealth: Payer: Self-pay | Admitting: Hematology and Oncology

## 2021-07-18 NOTE — Telephone Encounter (Signed)
Scheduled per sch msg. Called and was not able to leave msg. Mailed printout

## 2021-07-27 HISTORY — PX: BREAST LUMPECTOMY: SHX2

## 2021-08-04 NOTE — Progress Notes (Addendum)
Radiation Oncology         [phone]) [phone] ________________________________  Name: Caitlin Mcneil        MRN: [id number]  Date of Service: 08/07/2021 DOB: 1956/02/12  ET:KKOE, Elaina Hoops, MD  Erroll Luna, MD     REFERRING PHYSICIAN: Erroll Luna, MD   DIAGNOSIS: The encounter diagnosis was Malignant neoplasm of lower-outer quadrant of right breast of female, estrogen receptor positive ([place]).   HISTORY OF PRESENT ILLNESS: Caitlin Mcneil is a 66 y.o. female seen at the request of Dr. Brantley Stage for a new diagnosis of left breast cancer.  The patient was originally noted to have a palpable left breast mass with diagnostic imaging showing asymmetric changes/distortion.  She has a history of right breast cancer treated in 2014 with lumpectomy and radiation.  When she presented for her diagnostic work-up the palpable area in the left breast was consistent with 2 cysts benign in nature, the distortion was seen at 1 o'clock position with no ultrasound correlate and her left axilla was negative for adenopathy.  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  Her tumor was ER/PR positive, HER2 negative with a Ki-67 of 2%.  She is seen today to discuss treatment recommendations of her cancer.  Of note she went yesterday for her MRI which revealed a 2 cm mass consistent with her known biopsy site and non mass enhancement measuring up to 4.3 cm, and another area of clumped non mass enhancement in the left breast measuring 1.1 cm.     PREVIOUS RADIATION THERAPY: Yes   06/05/2013 through 07/21/2013: The right breast was treated by Dr. Lisbeth Renshaw using whole breast tangent fields. In this manner the patient was treated to 50.4 gray at 1.8 gray per fraction. The patient then received a boost using an en face electron field for an additional 10 gray at 2 gray per fraction using 18 MeV electrons. The patient's final dose was 60.4 gray in  33 fractions.     PAST MEDICAL HISTORY:  Past Medical History:  Diagnosis Date   Anxiety    Bipolar 1 disorder ([place])    Breast cancer ([place]) 03/30/13   right breast   Contact lens/glasses fitting    wears contacts or glasses   Depression    History of radiation therapy 110/10/14-12/26/14   right breast, 60.4Gy   Personal history of radiation therapy 2014   right breast       PAST SURGICAL HISTORY: Past Surgical History:  Procedure Laterality Date   ABDOMINAL HYSTERECTOMY  1996   BREAST BIOPSY Right 03/30/13   Ductal carcinoma in situ/calcifications,loq   BREAST LUMPECTOMY Right 05/02/2013   BREAST LUMPECTOMY WITH NEEDLE LOCALIZATION AND AXILLARY SENTINEL LYMPH NODE BX Right 05/02/2013   Procedure: BREAST LUMPECTOMY WITH NEEDLE LOCALIZATION AND AXILLARY SENTINEL LYMPH NODE BX;  Surgeon: Joyice Faster. Cornett, MD;  Location: [facility];  Service: General;  Laterality: Right;   FOOT SURGERY  2002   lt hammer toe-reconstruction   HARDWARE REMOVAL  2002   lt  foot hardware out   WISDOM TOOTH EXTRACTION       FAMILY HISTORY:  Family History  Problem Relation Age of Onset   Colon cancer Father 73   Alzheimer's disease Mother    Alzheimer's disease Maternal Grandmother    Cancer Maternal Grandfather        Cancer - NOS   Breast cancer Other  maternal great aunt     SOCIAL HISTORY:  reports that she has never smoked. She has never used smokeless tobacco. She reports that she does not drink alcohol and does not use drugs.  The patient is married and lives in [place]. She works in a Stage [employer].    ALLERGIES: Carbamazepine, Penicillins, Aspirin, Ciprofloxacin, and Ibuprofen   MEDICATIONS:  Current Outpatient Medications  Medication Sig Dispense Refill   cholecalciferol (VITAMIN D) 1000 UNITS tablet Take 1,000 Units by mouth daily.     hydrOXYzine (ATARAX/VISTARIL) 50 MG tablet Take 1 tablet (50 mg total) by mouth 3 (three) times daily as needed  for anxiety. 90 tablet 0   MINIVELLE 0.1 MG/24HR patch Place 1 patch onto the skin 2 (two) times a week.      pregabalin (LYRICA) 100 MG capsule Take 100 mg by mouth 2 (two) times daily.     QUEtiapine (SEROQUEL) 100 MG tablet Take 1 tablet (100 mg total) by mouth at bedtime. 30 tablet 0   temazepam (RESTORIL) 15 MG capsule Take 1 capsule (15 mg total) by mouth at bedtime as needed for sleep. 30 capsule 0   No current facility-administered medications for this visit.     REVIEW OF SYSTEMS: On review of systems, the patient reports that she is doing okay overall. She has had some pain in her breast since her biopsy. No other complaints are verbalized.      PHYSICAL EXAM:  Wt Readings from Last 3 Encounters:  07/14/21 196 lb 9 oz (89.2 kg)  10/09/15 193 lb 6.4 oz (87.7 kg)  12/23/14 168 lb 6.9 oz (76.4 kg)   Temp Readings from Last 3 Encounters:  07/14/21 97.6 F (36.4 C) (Temporal)  10/09/15 97.6 F (36.4 C) (Oral)  12/25/14 98.1 F (36.7 C) (Oral)   BP Readings from Last 3 Encounters:  07/14/21 (!) 156/78  10/09/15 131/76  12/25/14 114/69   Pulse Readings from Last 3 Encounters:  07/14/21 73  10/09/15 78  12/25/14 65    In general this is a well appearing caucasian female in no acute distress. She's alert and oriented x4 and appropriate throughout the examination. Cardiopulmonary assessment is negative for acute distress and she exhibits normal effort. Bilateral breast exam is deferred.    ECOG = 1  0 - Asymptomatic (Fully active, able to carry on all predisease activities without restriction)  1 - Symptomatic but completely ambulatory (Restricted in physically strenuous activity but ambulatory and able to carry out work of a light or sedentary nature. For example, light housework, office work)  2 - Symptomatic, <50% in bed during the day (Ambulatory and capable of all self care but unable to carry out any work activities. Up and about more than 50% of waking  hours)  3 - Symptomatic, >50% in bed, but not bedbound (Capable of only limited self-care, confined to bed or chair 50% or more of waking hours)  4 - Bedbound (Completely disabled. Cannot carry on any self-care. Totally confined to bed or chair)  5 - Death   Eustace Pen MM, Creech RH, Tormey DC, et al. [phone]). "Toxicity and response criteria of the [facility]". [place] Oncol. 5 (6): 649-55    LABORATORY DATA:  Lab Results  Component Value Date   WBC 9.5 12/24/2014   HGB 12.8 12/24/2014   HCT 39.1 12/24/2014   MCV 94.0 12/24/2014   PLT 120 (L) 12/24/2014   Lab Results  Component Value Date   NA 143  12/24/2014   K 4.3 12/24/2014   CL 109 12/24/2014   CO2 26 12/24/2014   Lab Results  Component Value Date   ALT 12 (L) 12/23/2014   AST 15 12/23/2014   ALKPHOS 44 12/23/2014   BILITOT 0.6 12/23/2014      RADIOGRAPHY: No results found.     IMPRESSION/PLAN: 1. Stage I, cTxN0M0, grade 1-2 ER/PR positive invasive lobular carcinoma of the left breast. Dr. Lisbeth Renshaw discusses the pathology findings and reviews the nature of early stage breast disease. The consensus from the breast conference includes breast conservation with lumpectomy with  sentinel node biopsy.  No systemic chemotherapy is anticipated at this time however final pathology could influence Oncotype orders.  She would however benefit from external radiotherapy to the breast  to reduce risks of local recurrence followed by antiestrogen therapy. We discussed the risks, benefits, short, and long term effects of radiotherapy, as well as the curative intent, and the patient is interested in proceeding. Dr. Lisbeth Renshaw discusses the delivery and logistics of radiotherapy and anticipates a course of 4 weeks of radiotherapy to the left breast with deep inspiration breath hold technique. We will see her back a few weeks after surgery to discuss the simulation process and anticipate we starting radiotherapy about 4-6  weeks after surgery.  2. History of Right breast cancer. She will be followed in surveillance for this along with #1.      In a visit lasting 60 minutes, greater than 50% of the time was spent face to face reviewing her case, as well as in preparation of, discussing, and coordinating the patient's care.  The above documentation reflects my direct findings during this shared patient visit. Please see the separate note by Dr. Lisbeth Renshaw on this date for the remainder of the patient's plan of care.    Carola Rhine, [facility]    **Disclaimer: This note was dictated with voice recognition software. Similar sounding words can inadvertently be transcribed and this note may contain transcription errors which may not have been corrected upon publication of note.**

## 2021-08-06 ENCOUNTER — Other Ambulatory Visit: Payer: Self-pay

## 2021-08-06 ENCOUNTER — Ambulatory Visit
Admission: RE | Admit: 2021-08-06 | Discharge: 2021-08-06 | Disposition: A | Discharge status: Home / Self Care | Payer: No Typology Code available for payment source | Source: Ambulatory Visit | Attending: Surgery | Admitting: Surgery

## 2021-08-06 DIAGNOSIS — C50812 Malignant neoplasm of overlapping sites of left female breast: Secondary | ICD-10-CM

## 2021-08-06 MED ORDER — GADOBENATE DIMEGLUMINE 529 MG/ML IV SOLN
9.0000 mL | Freq: Once | INTRAVENOUS | Status: AC | PRN
  Administered 2021-08-06: 9 mL via INTRAVENOUS

## 2021-08-06 NOTE — Progress Notes (Signed)
New Breast Cancer Diagnosis: Left Breast- LOQ  Did patient present with symptoms (if so, please note symptoms) or screening mammography?:Palpable mass with diagnostic imaging showing asymmetric changes/distortion.   Location and Extent of disease :left breast distortion located at 1 o'clock position, measured 0.4 cm in greatest dimension. Adenopathy no.   Histology per Pathology Report: grade 1-2, Invasive Lobular Carcinoma with associated lobular neoplasia. 07/02/2021  Receptor Status: ER(positive), PR (positive), Her2-neu (negative), Ki-(2%)  Surgeon and surgical plan, if any:  Dr. Brantley Stage 07/14/2021 -The patient is opted for left breast seed localized lumpectomy with left axillary sentinel lymph node mapping. Obtain MRI due to lobular phenotype. -I told her surgery plan could change depending on that but she would like to conserve her breast just like she did in 2014 on the right side. -Left Lumpectomy with radioactive seed and SLN biopsy 08/28/2021   Medical oncologist, treatment if any:   Dr. Lindi Adie 09/08/2021 3:15 pm   Family History of Breast/Ovarian/Prostate Cancer: Maternal Great Aunt had breast cancer  Lymphedema issues, if any: No     Pain issues, if any: Has occasional mild pain in her breast.    SAFETY ISSUES: Prior radiation? Right Breast 2014, lump with radiation with Dr. Lisbeth Renshaw, 6.5 weeks. Pacemaker/ICD? No Possible current pregnancy? Postmenopausal,Hysterectomy Is the patient on methotrexate? No  Current Complaints / other details:   -History of right breast cancer treated with lumpectomy and radiation 2014

## 2021-08-07 ENCOUNTER — Ambulatory Visit
Admission: RE | Admit: 2021-08-07 | Discharge: 2021-08-07 | Disposition: A | Discharge status: Home / Self Care | Payer: No Typology Code available for payment source | Source: Ambulatory Visit | Attending: Radiation Oncology | Admitting: Radiation Oncology

## 2021-08-07 ENCOUNTER — Encounter: Payer: Self-pay | Admitting: Radiation Oncology

## 2021-08-07 VITALS — BP 146/90 | HR 73 | Temp 98.3°F | Resp 20 | Ht 63.0 in | Wt 195.8 lb

## 2021-08-07 DIAGNOSIS — F419 Anxiety disorder, unspecified: Secondary | ICD-10-CM | POA: Diagnosis not present

## 2021-08-07 DIAGNOSIS — Z79899 Other long term (current) drug therapy: Secondary | ICD-10-CM | POA: Diagnosis not present

## 2021-08-07 DIAGNOSIS — Z170 Estrogen receptor positive status [ER+]: Secondary | ICD-10-CM

## 2021-08-07 DIAGNOSIS — C50511 Malignant neoplasm of lower-outer quadrant of right female breast: Secondary | ICD-10-CM

## 2021-08-07 DIAGNOSIS — C50412 Malignant neoplasm of upper-outer quadrant of left female breast: Secondary | ICD-10-CM | POA: Insufficient documentation

## 2021-08-07 DIAGNOSIS — Z923 Personal history of irradiation: Secondary | ICD-10-CM | POA: Insufficient documentation

## 2021-08-07 DIAGNOSIS — Z853 Personal history of malignant neoplasm of breast: Secondary | ICD-10-CM | POA: Diagnosis not present

## 2021-08-07 DIAGNOSIS — Z800 Family history of malignant neoplasm of digestive organs: Secondary | ICD-10-CM | POA: Diagnosis not present

## 2021-08-07 NOTE — Addendum Note (Signed)
Encounter addended by: Hayden Pedro, PA-C on: 08/07/2021 3:18 PM  Actions taken: Clinical Note Signed

## 2021-08-08 ENCOUNTER — Other Ambulatory Visit: Payer: Self-pay | Admitting: Surgery

## 2021-08-08 DIAGNOSIS — R9389 Abnormal findings on diagnostic imaging of other specified body structures: Secondary | ICD-10-CM

## 2021-08-15 ENCOUNTER — Ambulatory Visit
Admission: RE | Admit: 2021-08-15 | Discharge: 2021-08-15 | Disposition: A | Discharge status: Home / Self Care | Payer: No Typology Code available for payment source | Source: Ambulatory Visit | Attending: Surgery | Admitting: Surgery

## 2021-08-15 ENCOUNTER — Other Ambulatory Visit (HOSPITAL_COMMUNITY): Payer: Self-pay | Admitting: Diagnostic Radiology

## 2021-08-15 ENCOUNTER — Other Ambulatory Visit: Payer: Self-pay

## 2021-08-15 DIAGNOSIS — R9389 Abnormal findings on diagnostic imaging of other specified body structures: Secondary | ICD-10-CM

## 2021-08-15 MED ORDER — GADOBUTROL 1 MMOL/ML IV SOLN
9.0000 mL | Freq: Once | INTRAVENOUS | Status: AC | PRN
  Administered 2021-08-15: 9 mL via INTRAVENOUS

## 2021-08-19 ENCOUNTER — Ambulatory Visit: Payer: Self-pay | Admitting: Surgery

## 2021-08-19 DIAGNOSIS — C50919 Malignant neoplasm of unspecified site of unspecified female breast: Secondary | ICD-10-CM

## 2021-08-22 NOTE — Progress Notes (Addendum)
Surgical Instructions    Your procedure is scheduled on 08/28/21.  Report to [facility] Main Entrance "A" at 5:30 A.M., then check in with the Admitting office.  Call this number if you have problems the morning of surgery:  [phone]   If you have any questions prior to your surgery date call [phone]: Open Monday-Friday 8am-4pm    Remember:  Do not eat after midnight the night before your surgery  You may drink clear liquids until 4:30 the morning of your surgery.   Clear liquids allowed are: Water, Non-Citrus Juices (without pulp), Carbonated Beverages, Clear Tea, Black Coffee ONLY (NO MILK, CREAM OR POWDERED CREAMER of any kind), and Gatorade  Please complete your PRE-SURGERY ENSURE that was provided to you by 4:30 the morning of surgery.  Please, if able, drink it in one setting. DO NOT SIP.     Take these medicines the morning of surgery with A SIP OF WATER: acetaminophen (TYLENOL)  carboxymethylcellulose (REFRESH TEARS) cetirizine (ZYRTEC)  fluticasone (FLONASE) Guaifenesin  pregabalin (LYRICA)   As of today, STOP taking any Aspirin (unless otherwise instructed by your surgeon) Aleve, Naproxen, Ibuprofen, Motrin, Advil, Goody's, BC's, all herbal medications, fish oil, and all vitamins.           Do not wear jewelry or makeup Do not wear lotions, powders, perfumes or deodorant. Do not shave 48 hours prior to surgery.   Do not bring valuables to the hospital. DO Not wear nail polish, gel polish, artificial nails, or any other type of covering on natural nails (fingers and toes) If you have artificial nails or gel coating that need to be removed by a nail salon, please have this removed prior to surgery. Artificial nails or gel coating may interfere with anesthesia's ability to adequately monitor your vital signs.             [facility] is not responsible for any belongings or valuables.  Do NOT Smoke (Tobacco/Vaping)  24 hours prior to your procedure  If you use a  CPAP at night, you may bring your mask for your overnight stay.   Contacts, glasses, hearing aids, dentures or partials may not be worn into surgery, please bring cases for these belongings   For patients admitted to the hospital, discharge time will be determined by your treatment team.   Patients discharged the day of surgery will not be allowed to drive home, and someone needs to stay with them for 24 hours.  NO VISITORS WILL BE ALLOWED IN PRE-OP WHERE PATIENTS ARE PREPPED FOR SURGERY.  ONLY 1 SUPPORT PERSON MAY BE PRESENT IN THE WAITING ROOM WHILE YOU ARE IN SURGERY.  IF YOU ARE TO BE ADMITTED, ONCE YOU ARE IN YOUR ROOM YOU WILL BE ALLOWED TWO (2) VISITORS. 1 (ONE) VISITOR MAY STAY OVERNIGHT BUT MUST ARRIVE TO THE ROOM BY 8pm.  Minor children may have two parents present. Special consideration for safety and communication needs will be reviewed on a case by case basis.  Special instructions:    Oral Hygiene is also important to reduce your risk of infection.  Remember - BRUSH YOUR TEETH THE MORNING OF SURGERY WITH YOUR REGULAR TOOTHPASTE   [facility]- Preparing For Surgery  Before surgery, you can play an important role. Because skin is not sterile, your skin needs to be as free of germs as possible. You can reduce the number of germs on your skin by washing with CHG (chlorahexidine gluconate) Soap before surgery.  CHG is an antiseptic  cleaner which kills germs and bonds with the skin to continue killing germs even after washing.     Please do not use if you have an allergy to CHG or antibacterial soaps. If your skin becomes reddened/irritated stop using the CHG.  Do not shave (including legs and underarms) for at least 48 hours prior to first CHG shower. It is OK to shave your face.  Please follow these instructions carefully.     Shower the NIGHT BEFORE SURGERY and the MORNING OF SURGERY with CHG Soap.   If you chose to wash your hair, wash your hair first as usual with your normal  shampoo. After you shampoo, rinse your hair and body thoroughly to remove the shampoo.  Then [employer] and genitals (private parts) with your normal soap and rinse thoroughly to remove soap.  After that Use CHG Soap as you would any other liquid soap. You can apply CHG directly to the skin and wash gently with a scrungie or a clean washcloth.   Apply the CHG Soap to your body ONLY FROM THE NECK DOWN.  Do not use on open wounds or open sores. Avoid contact with your eyes, ears, mouth and genitals (private parts). Wash Face and genitals (private parts)  with your normal soap.   Wash thoroughly, paying special attention to the area where your surgery will be performed.  Thoroughly rinse your body with warm water from the neck down.  DO NOT shower/wash with your normal soap after using and rinsing off the CHG Soap.  Pat yourself dry with a CLEAN TOWEL.  Wear CLEAN PAJAMAS to bed the night before surgery  Place CLEAN SHEETS on your bed the night before your surgery  DO NOT SLEEP WITH PETS.   Day of Surgery:  Take a shower with CHG soap. Wear Clean/Comfortable clothing the morning of surgery Do not apply any deodorants/lotions.   Remember to brush your teeth WITH YOUR REGULAR TOOTHPASTE.   Please read over the following fact sheets that you were given.

## 2021-08-25 ENCOUNTER — Encounter (HOSPITAL_COMMUNITY)
Admission: RE | Admit: 2021-08-25 | Discharge: 2021-08-25 | Disposition: A | Discharge status: Home / Self Care | Payer: No Typology Code available for payment source | Source: Ambulatory Visit | Attending: Surgery | Admitting: Surgery

## 2021-08-25 ENCOUNTER — Other Ambulatory Visit: Payer: Self-pay

## 2021-08-25 ENCOUNTER — Encounter (HOSPITAL_COMMUNITY): Payer: Self-pay

## 2021-08-25 VITALS — BP 140/90 | HR 72 | Temp 98.4°F | Resp 18 | Ht 63.0 in | Wt 196.2 lb

## 2021-08-25 DIAGNOSIS — E119 Type 2 diabetes mellitus without complications: Secondary | ICD-10-CM | POA: Insufficient documentation

## 2021-08-25 DIAGNOSIS — Z01818 Encounter for other preprocedural examination: Secondary | ICD-10-CM | POA: Diagnosis present

## 2021-08-25 HISTORY — DX: Unspecified osteoarthritis, unspecified site: M19.90

## 2021-08-25 HISTORY — DX: Type 2 diabetes mellitus without complications: E11.9

## 2021-08-25 LAB — CBC
HCT: 40.7 % (ref 36.0–46.0)
Hemoglobin: 13.4 g/dL (ref 12.0–15.0)
MCH: 29.8 pg (ref 26.0–34.0)
MCHC: 32.9 g/dL (ref 30.0–36.0)
MCV: 90.6 fL (ref 80.0–100.0)
Platelets: 152 10*3/uL (ref 150–400)
RBC: 4.49 MIL/uL (ref 3.87–5.11)
RDW: 13 % (ref 11.5–15.5)
WBC: 7.1 10*3/uL (ref 4.0–10.5)
nRBC: 0 % (ref 0.0–0.2)

## 2021-08-25 LAB — BASIC METABOLIC PANEL
Anion gap: 5 (ref 5–15)
BUN: 21 mg/dL (ref 8–23)
CO2: 30 mmol/L (ref 22–32)
Calcium: 9.3 mg/dL (ref 8.9–10.3)
Chloride: 105 mmol/L (ref 98–111)
Creatinine, Ser: 0.73 mg/dL (ref 0.44–1.00)
GFR, Estimated: >60 mL/min (ref >60)
Glucose, Bld: 122 mg/dL — ABNORMAL HIGH (ref 70–99)
Potassium: 4.3 mmol/L (ref 3.5–5.1)
Sodium: 140 mmol/L (ref 135–145)

## 2021-08-25 LAB — HEMOGLOBIN A1C
Hgb A1c MFr Bld: 6.5 % — ABNORMAL HIGH (ref 4.8–5.6)
Mean Plasma Glucose: 139.85 mg/dL

## 2021-08-25 LAB — GLUCOSE, CAPILLARY: Glucose-Capillary: 162 mg/dL — ABNORMAL HIGH (ref 70–99)

## 2021-08-25 NOTE — Progress Notes (Signed)
PCP - Dr. Okey Dupre Cardiologist - denies  PPM/ICD - denies   Chest x-ray - denies EKG - 08/25/21 at PAT Stress Test - denies ECHO - denies Cardiac Cath - denies  Sleep Study - denies  DM- Type 2 Pt does not check CBG at home. She said her PCP was going to do a f/u A1C before any type of diabetic medication  Blood Thinner Instructions: n/a Aspirin Instructions: n/a  ERAS Protcol - yes PRE-SURGERY G2- given at PAT  COVID TEST- n/a, ambulatory surgery   Anesthesia review: no  Patient denies shortness of breath, fever, cough and chest pain at PAT appointment   All instructions explained to the patient, with a verbal understanding of the material. Patient agrees to go over the instructions while at home for a better understanding. Patient also instructed to wear a mask in public for 3 days prior to surgery. The opportunity to ask questions was provided.

## 2021-08-26 ENCOUNTER — Ambulatory Visit
Admission: RE | Admit: 2021-08-26 | Discharge: 2021-08-26 | Disposition: A | Discharge status: Home / Self Care | Payer: No Typology Code available for payment source | Source: Ambulatory Visit | Attending: Surgery | Admitting: Surgery

## 2021-08-26 DIAGNOSIS — C50912 Malignant neoplasm of unspecified site of left female breast: Secondary | ICD-10-CM

## 2021-08-28 ENCOUNTER — Ambulatory Visit
Admission: RE | Admit: 2021-08-28 | Discharge: 2021-08-28 | Disposition: A | Discharge status: Home / Self Care | Payer: No Typology Code available for payment source | Source: Ambulatory Visit | Attending: Surgery | Admitting: Surgery

## 2021-08-28 ENCOUNTER — Encounter (HOSPITAL_COMMUNITY)
Admission: RE | Disposition: A | Discharge status: Home / Self Care | Payer: Self-pay | Source: Home / Self Care | Attending: Surgery

## 2021-08-28 ENCOUNTER — Other Ambulatory Visit: Payer: Self-pay

## 2021-08-28 ENCOUNTER — Ambulatory Visit (HOSPITAL_COMMUNITY): Payer: No Typology Code available for payment source | Admitting: Anesthesiology

## 2021-08-28 ENCOUNTER — Ambulatory Visit
Admit: 2021-08-28 | Discharge: 2021-08-28 | Disposition: A | Discharge status: Home / Self Care | Payer: No Typology Code available for payment source | Attending: Surgery | Admitting: Surgery

## 2021-08-28 ENCOUNTER — Ambulatory Visit (HOSPITAL_COMMUNITY): Payer: No Typology Code available for payment source | Admitting: Physician Assistant

## 2021-08-28 ENCOUNTER — Encounter (HOSPITAL_COMMUNITY): Payer: Self-pay | Admitting: Surgery

## 2021-08-28 ENCOUNTER — Ambulatory Visit (HOSPITAL_COMMUNITY)
Admission: RE | Admit: 2021-08-28 | Discharge: 2021-08-28 | Disposition: A | Discharge status: Home / Self Care | Payer: No Typology Code available for payment source | Attending: Surgery | Admitting: Surgery

## 2021-08-28 DIAGNOSIS — Z170 Estrogen receptor positive status [ER+]: Secondary | ICD-10-CM | POA: Diagnosis not present

## 2021-08-28 DIAGNOSIS — C50812 Malignant neoplasm of overlapping sites of left female breast: Secondary | ICD-10-CM | POA: Diagnosis present

## 2021-08-28 DIAGNOSIS — Z853 Personal history of malignant neoplasm of breast: Secondary | ICD-10-CM | POA: Insufficient documentation

## 2021-08-28 DIAGNOSIS — N6002 Solitary cyst of left breast: Secondary | ICD-10-CM | POA: Diagnosis not present

## 2021-08-28 DIAGNOSIS — C50912 Malignant neoplasm of unspecified site of left female breast: Secondary | ICD-10-CM

## 2021-08-28 DIAGNOSIS — F418 Other specified anxiety disorders: Secondary | ICD-10-CM | POA: Diagnosis not present

## 2021-08-28 DIAGNOSIS — C50412 Malignant neoplasm of upper-outer quadrant of left female breast: Secondary | ICD-10-CM | POA: Insufficient documentation

## 2021-08-28 HISTORY — PX: BREAST LUMPECTOMY WITH RADIOACTIVE SEED AND SENTINEL LYMPH NODE BIOPSY: SHX6550

## 2021-08-28 LAB — GLUCOSE, CAPILLARY: Glucose-Capillary: 120 mg/dL — ABNORMAL HIGH (ref 70–99)

## 2021-08-28 SURGERY — BREAST LUMPECTOMY WITH RADIOACTIVE SEED AND SENTINEL LYMPH NODE BIOPSY
Anesthesia: General | Site: Breast | Laterality: Left

## 2021-08-28 MED ORDER — MIDAZOLAM HCL 2 MG/2ML IJ SOLN
INTRAMUSCULAR | Status: AC
  Filled 2021-08-28: qty 2

## 2021-08-28 MED ORDER — LACTATED RINGERS IV SOLN: INTRAVENOUS | Status: DC

## 2021-08-28 MED ORDER — MIDAZOLAM HCL 5 MG/5ML IJ SOLN
INTRAMUSCULAR | Status: DC | PRN
  Administered 2021-08-28: 2 mg via INTRAVENOUS

## 2021-08-28 MED ORDER — PROMETHAZINE HCL 25 MG/ML IJ SOLN: 6.2500 mg | INTRAMUSCULAR | Status: DC | PRN

## 2021-08-28 MED ORDER — FENTANYL CITRATE (PF) 100 MCG/2ML IJ SOLN
INTRAMUSCULAR | Status: DC | PRN
  Administered 2021-08-28: 25 ug via INTRAVENOUS
  Administered 2021-08-28: 50 ug via INTRAVENOUS
  Administered 2021-08-28: 25 ug via INTRAVENOUS

## 2021-08-28 MED ORDER — GLYCOPYRROLATE PF 0.2 MG/ML IJ SOSY
PREFILLED_SYRINGE | INTRAMUSCULAR | Status: AC
  Filled 2021-08-28: qty 1

## 2021-08-28 MED ORDER — EPHEDRINE 5 MG/ML INJ
INTRAVENOUS | Status: AC
  Filled 2021-08-28: qty 5

## 2021-08-28 MED ORDER — CHLORHEXIDINE GLUCONATE CLOTH 2 % EX PADS: 6.0000 | MEDICATED_PAD | Freq: Once | CUTANEOUS | Status: DC

## 2021-08-28 MED ORDER — OXYCODONE HCL 5 MG PO TABS
5.0000 mg | ORAL_TABLET | Freq: Once | ORAL | Status: AC | PRN
  Administered 2021-08-28: 5 mg via ORAL

## 2021-08-28 MED ORDER — ACETAMINOPHEN 10 MG/ML IV SOLN: 1000.0000 mg | Freq: Once | INTRAVENOUS | Status: DC | PRN

## 2021-08-28 MED ORDER — PHENYLEPHRINE 40 MCG/ML (10ML) SYRINGE FOR IV PUSH (FOR BLOOD PRESSURE SUPPORT)
PREFILLED_SYRINGE | INTRAVENOUS | Status: DC | PRN
  Administered 2021-08-28 (×3): 80 ug via INTRAVENOUS
  Administered 2021-08-28: 40 ug via INTRAVENOUS
  Administered 2021-08-28: 80 ug via INTRAVENOUS
  Administered 2021-08-28: 40 ug via INTRAVENOUS

## 2021-08-28 MED ORDER — LIDOCAINE HCL (CARDIAC) PF 100 MG/5ML IV SOSY
PREFILLED_SYRINGE | INTRAVENOUS | Status: DC | PRN
  Administered 2021-08-28: 40 mg via INTRAVENOUS

## 2021-08-28 MED ORDER — ACETAMINOPHEN 325 MG PO TABS: 325.0000 mg | ORAL_TABLET | ORAL | Status: DC | PRN

## 2021-08-28 MED ORDER — LIDOCAINE 2% (20 MG/ML) 5 ML SYRINGE
INTRAMUSCULAR | Status: AC
  Filled 2021-08-28: qty 5

## 2021-08-28 MED ORDER — EPHEDRINE SULFATE-NACL 50-0.9 MG/10ML-% IV SOSY
PREFILLED_SYRINGE | INTRAVENOUS | Status: DC | PRN
Start: 2021-08-28 — End: 2021-08-28
  Administered 2021-08-28: 5 mg via INTRAVENOUS
  Administered 2021-08-28 (×2): 10 mg via INTRAVENOUS

## 2021-08-28 MED ORDER — OXYCODONE HCL 5 MG/5ML PO SOLN: 5.0000 mg | Freq: Once | ORAL | Status: AC | PRN

## 2021-08-28 MED ORDER — ONDANSETRON HCL 4 MG/2ML IJ SOLN
INTRAMUSCULAR | Status: DC | PRN
  Administered 2021-08-28: 4 mg via INTRAVENOUS

## 2021-08-28 MED ORDER — OXYCODONE HCL 5 MG PO TABS
ORAL_TABLET | ORAL | Status: AC
  Filled 2021-08-28: qty 1

## 2021-08-28 MED ORDER — AMISULPRIDE (ANTIEMETIC) 5 MG/2ML IV SOLN: 10.0000 mg | Freq: Once | INTRAVENOUS | Status: DC | PRN

## 2021-08-28 MED ORDER — 0.9 % SODIUM CHLORIDE (POUR BTL) OPTIME
TOPICAL | Status: DC | PRN
  Administered 2021-08-28: 1000 mL

## 2021-08-28 MED ORDER — CLINDAMYCIN PHOSPHATE 900 MG/50ML IV SOLN
900.0000 mg | INTRAVENOUS | Status: AC
  Administered 2021-08-28: 900 mg via INTRAVENOUS
  Filled 2021-08-28: qty 50

## 2021-08-28 MED ORDER — BUPIVACAINE-EPINEPHRINE (PF) 0.25% -1:200000 IJ SOLN
INTRAMUSCULAR | Status: AC
  Filled 2021-08-28: qty 30

## 2021-08-28 MED ORDER — ACETAMINOPHEN 160 MG/5ML PO SOLN: 325.0000 mg | ORAL | Status: DC | PRN

## 2021-08-28 MED ORDER — GLYCOPYRROLATE PF 0.2 MG/ML IJ SOSY
PREFILLED_SYRINGE | INTRAMUSCULAR | Status: DC | PRN
  Administered 2021-08-28: .2 mg via INTRAVENOUS

## 2021-08-28 MED ORDER — FENTANYL CITRATE (PF) 250 MCG/5ML IJ SOLN
INTRAMUSCULAR | Status: AC
  Filled 2021-08-28: qty 5

## 2021-08-28 MED ORDER — OXYCODONE HCL 5 MG PO TABS: 5.0000 mg | ORAL_TABLET | Freq: Four times a day (QID) | ORAL | 0 refills | Status: DC | PRN

## 2021-08-28 MED ORDER — DEXAMETHASONE SODIUM PHOSPHATE 4 MG/ML IJ SOLN
INTRAMUSCULAR | Status: DC | PRN
Start: 2021-08-28 — End: 2021-08-28
  Administered 2021-08-28: 5 mg via INTRAVENOUS

## 2021-08-28 MED ORDER — DEXAMETHASONE SODIUM PHOSPHATE 10 MG/ML IJ SOLN
INTRAMUSCULAR | Status: AC
  Filled 2021-08-28: qty 1

## 2021-08-28 MED ORDER — FENTANYL CITRATE (PF) 100 MCG/2ML IJ SOLN: 25.0000 ug | INTRAMUSCULAR | Status: DC | PRN

## 2021-08-28 MED ORDER — ONDANSETRON HCL 4 MG/2ML IJ SOLN
INTRAMUSCULAR | Status: AC
  Filled 2021-08-28: qty 2

## 2021-08-28 MED ORDER — PROPOFOL 10 MG/ML IV BOLUS
INTRAVENOUS | Status: DC | PRN
Start: 2021-08-28 — End: 2021-08-28
  Administered 2021-08-28: 200 mg via INTRAVENOUS

## 2021-08-28 MED ORDER — PROPOFOL 10 MG/ML IV BOLUS
INTRAVENOUS | Status: AC
  Filled 2021-08-28: qty 20

## 2021-08-28 MED ORDER — PHENYLEPHRINE 40 MCG/ML (10ML) SYRINGE FOR IV PUSH (FOR BLOOD PRESSURE SUPPORT)
PREFILLED_SYRINGE | INTRAVENOUS | Status: AC
  Filled 2021-08-28: qty 10

## 2021-08-28 MED ORDER — CHLORHEXIDINE GLUCONATE 0.12 % MT SOLN
15.0000 mL | Freq: Once | OROMUCOSAL | Status: AC
  Administered 2021-08-28: 15 mL via OROMUCOSAL
  Filled 2021-08-28: qty 15

## 2021-08-28 MED ORDER — ORAL CARE MOUTH RINSE: 15.0000 mL | Freq: Once | OROMUCOSAL | Status: AC

## 2021-08-28 MED ORDER — BUPIVACAINE-EPINEPHRINE (PF) 0.5% -1:200000 IJ SOLN
INTRAMUSCULAR | Status: DC | PRN
Start: 2021-08-28 — End: 2021-08-28
  Administered 2021-08-28: 30 mL

## 2021-08-28 MED ORDER — PHENYLEPHRINE HCL-NACL 20-0.9 MG/250ML-% IV SOLN
INTRAVENOUS | Status: DC | PRN
Start: 2021-08-28 — End: 2021-08-28
  Administered 2021-08-28: 50 ug/min via INTRAVENOUS
  Administered 2021-08-28: 100 ug/min via INTRAVENOUS

## 2021-08-28 MED ORDER — BUPIVACAINE-EPINEPHRINE 0.25% -1:200000 IJ SOLN
INTRAMUSCULAR | Status: DC | PRN
Start: 2021-08-28 — End: 2021-08-28
  Administered 2021-08-28: 26 mL

## 2021-08-28 SURGICAL SUPPLY — 52 items
APL PRP STRL LF DISP 70% ISPRP (MISCELLANEOUS) ×2
APPLIER CLIP 9.375 MED OPEN (MISCELLANEOUS) ×3
BAG COUNTER SPONGE SURGICOUNT (BAG) ×3 IMPLANT
BAG SPNG CNTER NS LX DISP (BAG) ×1
BINDER BREAST LRG (GAUZE/BANDAGES/DRESSINGS) IMPLANT
BINDER BREAST XLRG (GAUZE/BANDAGES/DRESSINGS) ×2 IMPLANT
CANISTER SUCT 3000ML PPV (MISCELLANEOUS) ×3 IMPLANT
CHLORAPREP W/TINT 26 (MISCELLANEOUS) ×3 IMPLANT
CLIP APPLIE 9.375 MED OPEN (MISCELLANEOUS) ×2 IMPLANT
CNTNR URN SCR LID CUP LEK RST (MISCELLANEOUS) ×1 IMPLANT
CONT SPEC 4OZ STRL OR WHT (MISCELLANEOUS)
COVER PROBE W GEL 5X96 (DRAPES) ×5 IMPLANT
COVER SURGICAL LIGHT HANDLE (MISCELLANEOUS) ×3 IMPLANT
DERMABOND ADVANCED (GAUZE/BANDAGES/DRESSINGS) ×1
DERMABOND ADVANCED .7 DNX12 (GAUZE/BANDAGES/DRESSINGS) ×2 IMPLANT
DEVICE DUBIN SPECIMEN MAMMOGRA (MISCELLANEOUS) ×5 IMPLANT
DRAPE CHEST BREAST 15X10 FENES (DRAPES) ×3 IMPLANT
ELECT CAUTERY BLADE 6.4 (BLADE) ×3 IMPLANT
ELECT REM PT RETURN 9FT ADLT (ELECTROSURGICAL) ×3
ELECTRODE REM PT RTRN 9FT ADLT (ELECTROSURGICAL) ×2 IMPLANT
GLOVE SRG 8 PF TXTR STRL LF DI (GLOVE) ×2 IMPLANT
GLOVE SURG ENC MOIS LTX SZ8 (GLOVE) ×5 IMPLANT
GLOVE SURG UNDER POLY LF SZ6.5 (GLOVE) ×4 IMPLANT
GLOVE SURG UNDER POLY LF SZ7 (GLOVE) ×2 IMPLANT
GLOVE SURG UNDER POLY LF SZ8 (GLOVE) ×3
GOWN STRL REUS W/ TWL LRG LVL3 (GOWN DISPOSABLE) ×2 IMPLANT
GOWN STRL REUS W/ TWL XL LVL3 (GOWN DISPOSABLE) ×2 IMPLANT
GOWN STRL REUS W/TWL LRG LVL3 (GOWN DISPOSABLE) ×3
GOWN STRL REUS W/TWL XL LVL3 (GOWN DISPOSABLE) ×3
KIT BASIN OR (CUSTOM PROCEDURE TRAY) ×3 IMPLANT
KIT MARKER MARGIN INK (KITS) ×3 IMPLANT
LIGHT WAVEGUIDE WIDE FLAT (MISCELLANEOUS) IMPLANT
NDL 18GX1X1/2 (RX/OR ONLY) (NEEDLE) IMPLANT
NDL FILTER BLUNT 18X1 1/2 (NEEDLE) IMPLANT
NDL HYPO 25GX1X1/2 BEV (NEEDLE) ×1 IMPLANT
NEEDLE 18GX1X1/2 (RX/OR ONLY) (NEEDLE) IMPLANT
NEEDLE FILTER BLUNT 18X 1/2SAF (NEEDLE)
NEEDLE FILTER BLUNT 18X1 1/2 (NEEDLE) IMPLANT
NEEDLE HYPO 25GX1X1/2 BEV (NEEDLE) ×6 IMPLANT
NS IRRIG 1000ML POUR BTL (IV SOLUTION) ×3 IMPLANT
PACK GENERAL/GYN (CUSTOM PROCEDURE TRAY) ×3 IMPLANT
SUT MNCRL AB 4-0 PS2 18 (SUTURE) ×9 IMPLANT
SUT SILK 2 0 SH (SUTURE) IMPLANT
SUT VIC AB 2-0 SH 27 (SUTURE)
SUT VIC AB 2-0 SH 27XBRD (SUTURE) IMPLANT
SUT VIC AB 3-0 SH 18 (SUTURE) ×5 IMPLANT
SUT VIC AB 3-0 SH 27 (SUTURE)
SUT VIC AB 3-0 SH 27X BRD (SUTURE) IMPLANT
SUT VIC AB 3-0 SH 8-18 (SUTURE) ×3 IMPLANT
SYR CONTROL 10ML LL (SYRINGE) ×3 IMPLANT
TOWEL GREEN STERILE (TOWEL DISPOSABLE) ×3 IMPLANT
TOWEL GREEN STERILE FF (TOWEL DISPOSABLE) ×3 IMPLANT

## 2021-08-28 NOTE — Op Note (Signed)
Preoperative diagnosis: Stage II left breast cancer lobular subtype left upper outer quadrant ER positive atypical lobular hyperplasia  Postoperative diagnosis: Same  Procedure: Left breast seed localized lumpectomy x2 and left axillary sentinel lymph node mapping utilizing mag trace  Surgeon: Erroll Luna, MD  Anesthesia: LMA with 0.25% Marcaine plain  EBL: 30 cc  Specimens: Left breast mass which contained 2 seeds and 2 clips with additional tissue encompassing the non-mass enhancement noted on MRI left lower breast mass corresponding to the atypical lobular aplasia and enhancement noted on MRI and 1 left axillary sentinel node  Drains: None  IV fluids: Per anesthesia record  Indications for procedure: The patient is a 66 year old female with stage II left breast cancer consisting of a lobular subtype.  MRI showed enhancement in the area in the left upper quadrant around the 2 cm masslike enhancement.  The entire area measured 4.3 cm.  She had an additional 1 cm area of enhancement in the lower breast on the left.  She was to conserve her breast after discussing all of her options.  She presents today for breast conserving surgery as outlined above.The procedure has been discussed with the patient. Alternatives to surgery have been discussed with the patient.  Risks of surgery include bleeding,  Infection,  Seroma formation, death,  and the need for further surgery.   The patient understands and wishes to proceed. Sentinel lymph node mapping and dissection has been discussed with the patient.  Risk of bleeding,  Infection,  Seroma formation,  Additional procedures,,  Shoulder weakness ,  Shoulder stiffness, lymphedema nerve and blood vessel injury and reaction to the mapping dyes have been discussed.  Alternatives to surgery have been discussed with the patient.  The patient agrees to proceed.      Description of procedure: The patient was met in the holding area and questions were  answered.  Of note she had 3 seeds placed at the [place] yesterday.  We reviewed the operation.  The left side was marked as the correct site.  Films were available for review.  Of note she underwent pectoral block on the left per anesthesia.  She was then taken back to the operative room.  She is placed supine upon the OR table.  After induction of general anesthesia, the left breast was prepped and draped in a sterile fashion.  2 cc of mag trace were injected in a subareolar position and after timeout was performed and massaged.  Of note she also received preoperative antibiotics of Ancef.  After massaging the breast for 5 minutes, the neoprobe was used and all 3 seeds were identified in the left breast.  I marked all 3 areas with a marking pen.  The no malignancy was in the left breast upper outer quadrant.  I infiltrated all areas with local anesthetic.  A curvilinear incision was made in the left upper outer quadrant.  Dissection was carried down and the seeds were identified.  All tissue around both seeds were excised and 90 excised posterior medial as guided by the MRI to include the enhancement.  This was taken as 1 specimen.  The Faxitron image revealed both seeds and both clips to be present.  This wound was then packed temporarily.  Neoprobe was used to identify the seed in the lower left breast.  A transverse incision was made below the nipple-areolar complex.  Dissection was carried down all tissue and the seed and clip were excised with a grossly negative margin.  The specimen was imaged and showed the seed and clip as well.  This was irrigated.  Local anesthetic infiltrated.  A clip was placed to mark it.  It was then closed with 3-0 Vicryl and 4 Monocryl.  The left upper outer quadrant incision was examined.  Was irrigated.  Local anesthetic was infiltrated throughout the cavity.  There is made hemostatic.  It was then closed the deep layer 3-0 Vicryl and a 4 Monocryl subcuticular  stitch.  Of note clips were placed in the cavity.  The sentinel node was done next.  The mag trace probe was used.  A hotspot was notified the left axilla.  A transverse incision was made at the inferior border of the axillary hairline.  This was about 4 cm.  Dissection was carried down into the level 1 deep contents.  Identified 1 hot sentinel node.  Background counts were well below the threshold for this.  I felt no palpable adenopathy either.  Irrigation was used and suctioned out.  The long thoracic nerve, thoracodorsal trunk and axillary vein were preserved.  Arista was placed in the cavity.  Hemostasis achieved.  I then closed the deep layer 3-0 Vicryl and 4 Monocryl was used to close the skin in a subcuticular fashion.  Dermabond was applied.  All counts were found to be correct.  Breast binder placed.  Patient was awoke extubated taken to recovery in satisfactory condition.

## 2021-08-28 NOTE — Interval H&P Note (Signed)
History and Physical Interval Note:  08/28/2021 7:23 AM  Caitlin Mcneil  has presented today for surgery, with the diagnosis of LEFT BREAST CANCER.  The various methods of treatment have been discussed with the patient and family. After consideration of risks, benefits and other options for treatment, the patient has consented to  Procedure(s): LEFT BREAST LUMPECTOMY WITH RADIOACTIVE SEED AND SENTINEL LYMPH NODE BIOPSY (Left) BREAST LUMPECTOMY WITH RADIOACTIVE SEED LOCALIZATION (Left) as a surgical intervention.  The patient's history has been reviewed, patient examined, no change in status, stable for surgery.  I have reviewed the patient's chart and labs.  Questions were answered to the patient's satisfaction.     [place]

## 2021-08-28 NOTE — Transfer of Care (Signed)
Immediate Anesthesia Transfer of Care Note  Patient: Caitlin Mcneil  Procedure(s) Performed: LEFT BREAST LUMPECTOMY WITH RADIOACTIVE SEED AND SENTINEL LYMPH NODE BIOPSY (Left: Breast) BREAST LUMPECTOMY WITH RADIOACTIVE SEED LOCALIZATION (Left)  Patient Location: PACU  Anesthesia Type:General  Level of Consciousness: drowsy, patient cooperative and responds to stimulation  Airway & Oxygen Therapy: Patient Spontanous Breathing and Patient connected to nasal cannula oxygen  Post-op Assessment: Report given to RN, Post -op Vital signs reviewed and stable and Patient moving all extremities X 4  Post vital signs: Reviewed and stable  Last Vitals:  Vitals Value Taken Time  BP 146/74 08/28/21 0936  Temp    Pulse 90 08/28/21 0936  Resp 18 08/28/21 0936  SpO2 100 % 08/28/21 0936  Vitals shown include unvalidated device data.  Last Pain:  Vitals:   08/28/21 0623  TempSrc: Oral  PainSc:          Complications: No notable events documented.

## 2021-08-28 NOTE — Anesthesia Procedure Notes (Signed)
Procedure Name: LMA Insertion Date/Time: 08/28/2021 7:56 AM Performed by: Michele Rockers, CRNA Pre-anesthesia Checklist: Patient identified, Patient being monitored, Timeout performed, Emergency Drugs available and Suction available Patient Re-evaluated:Patient Re-evaluated prior to induction Oxygen Delivery Method: Circle system utilized Preoxygenation: Pre-oxygenation with 100% oxygen Induction Type: IV induction Ventilation: Mask ventilation without difficulty LMA Size: 4.0 Number of attempts: 1 Placement Confirmation: positive ETCO2 and breath sounds checked- equal and bilateral Tube secured with: Tape Dental Injury: Teeth and Oropharynx as per pre-operative assessment

## 2021-08-28 NOTE — H&P (Signed)
History of Present Illness: Caitlin Mcneil is a 66 y.o. female who is seen today as an office consultation at the request of Dr. Nori Riis for evaluation of Breast Problem .   Patient presents for evaluation of left breast cancer. She underwent breast conserving surgery in 2014 on the right for an invasive ductal carcinoma. She was noted to have 2 mammographic abnormalities around 12:00 and 1:00 each around 7 mm in size. 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 No history of mass but she did fall on the left breast. She was noted to have a cyst in the left breast which was separate from this. She had some bruising when she fell a few months ago on the left side as well.  Review of Systems: A complete review of systems was obtained from the patient. I have reviewed this information and discussed as appropriate with the patient. See HPI as well for other ROS.    Medical History: Past Medical History:  Diagnosis Date   Anxiety   Arthritis   History of cancer   There is no problem list on file for this patient.  Past Surgical History:  Procedure Laterality Date   HYSTERECTOMY   left foot surgery Left   MASTECTOMY PARTIAL / LUMPECTOMY N/A    Allergies  Allergen Reactions   Carbamazepine Anaphylaxis, Other (See Comments) and Unknown   Penicillins Anaphylaxis   Ibuprofen Rash and Unknown   Aspirin Rash   Ciprofloxacin Hives and Rash   Current Outpatient Medications on File Prior to Visit  Medication Sig Dispense Refill   acidophilus-pectin, citrus (PROBIOTIC ACIDOPHILUS-PECTIN) 100 million cell-10 mg Cap   azithromycin (ZITHROMAX) 250 MG tablet azithromycin 250 mg tablet   cholecalciferol (VITAMIN D3) 1000 unit tablet Take 1 tablet (1,000 Units total) by mouth   ciprofloxacin HCl (CIPRO) 500 MG tablet ciprofloxacin 500 mg tablet TAKE 1 TABLET BY MOUTH EVERY 12 HOURS FOR 5 DAYS   fluticasone propionate (FLONASE ALLERGY  RELIEF) 50 mcg/actuation nasal spray 2 puffs each nostril   guaiFENesin (MUCINEX) 600 mg SR tablet 1 tablet (600 mg total) as needed   montelukast (SINGULAIR) 10 mg tablet montelukast 10 mg tablet   QUEtiapine (SEROQUEL) 100 MG tablet   temazepam (RESTORIL) 15 mg capsule 1 capsule at bedtime   No current facility-administered medications on file prior to visit.   Family History  Problem Relation Age of Onset   Colon cancer Father   Colon cancer Brother    Social History   Tobacco Use  Smoking Status Never  Smokeless Tobacco Never    Social History   Socioeconomic History   Marital status: Married  Tobacco Use   Smoking status: Never   Smokeless tobacco: Never  Vaping Use   Vaping Use: Never used  Substance and Sexual Activity   Alcohol use: Never   Drug use: Never   Objective:   Vitals:  07/14/21 1422  BP: (!) 158/90  Pulse: 82  Temp: 36.8 C (98.2 F)  SpO2: 95%  Weight: 89.4 kg (197 lb 3.2 oz)  Height: 160 cm ($Remov'5\' 3"'gElGoX$ )   Body mass index is 34.93 kg/m.  Physical Exam Constitutional:  Appearance: Normal appearance.  HENT:  Head: Normocephalic.  Cardiovascular:  Rate and Rhythm: Normal rate.  Pulmonary:  Effort: Pulmonary effort is normal.  Breath sounds: Normal breath sounds.  Chest:  Breasts: Right: Normal.  Left: Normal.   Musculoskeletal:  Cervical back: Normal range of motion and  neck supple.  Lymphadenopathy:  Upper Body:  Right upper body: No supraclavicular or axillary adenopathy.  Left upper body: No supraclavicular adenopathy.  Skin: General: Skin is warm.  Neurological:  General: No focal deficit present.  Mental Status: She is alert.  Psychiatric:  Mood and Affect: Mood normal.  Behavior: Behavior normal.     Labs, Imaging and Diagnostic Testing: ADDITIONAL INFORMATION: PROGNOSTIC INDICATORS Results: IMMUNOHISTOCHEMICAL AND MORPHOMETRIC ANALYSIS PERFORMED MANUALLY The tumor cells are NEGATIVE for Her2 (0). Estrogen  Receptor: 90%, POSITIVE, STRONG STAINING INTENSITY Progesterone Receptor: 100%, POSITIVE, STRONG STAINING INTENSITY Proliferation Marker Ki67: 2% REFERENCE RANGE ESTROGEN RECEPTOR NEGATIVE 0% POSITIVE =>1% REFERENCE RANGE PROGESTERONE RECEPTOR NEGATIVE 0% POSITIVE =>1% All controls stained appropriately Claudette Laws MD Pathologist, Electronic Signature ( Signed 07/04/2021) FINAL DIAGNOSIS 1 of 3 Amended copy Addendum FINAL for [name], [name] ([id number]) Diagnosis Breast, left, needle core biopsy, upper outer - INVASIVE MAMMARY CARCINOMA, GRADE I/II. - LOBULAR NEOPLASIA (ALH/LCIS). - SEE MICROSCOPIC DESCRIPTION. Microscopic Comment The greatest tumor dimension is 0.4 cm. E-cadherin and breast prognostic profile will be performed. Dr. Gari Crown agrees. Called to [place] on 07/03/21. (JDP:gt, 07/03/21) Addendum: Immunohistochemistry for E-cadherin is negative consistent with lobular carcinoma. Claudette Laws MD Pathologist, Electronic Signature (Case signed 07/03/2021) Corrected Report Signer Claudette Laws MD Pathologist, Electronic Signature (Case signed 07/03/2021) Specimen Gross and CLINICAL DATA: 66 year old female presenting for evaluation of a palpable lump in the left breast. She had a significant fall several months ago with pain and a lump in the superior left breast. The pain has resolved, but the lump is still present. The patient has history of a right breast lumpectomy in 2014.   EXAM: DIGITAL DIAGNOSTIC BILATERAL MAMMOGRAM WITH TOMOSYNTHESIS AND CAD; ULTRASOUND LEFT BREAST LIMITED   TECHNIQUE: Bilateral digital diagnostic mammography and breast tomosynthesis was performed. The images were evaluated with computer-aided detection.; Targeted ultrasound examination of the left breast was performed.   COMPARISON: Previous exam(s).   ACR Breast Density Category d: The breast tissue is extremely dense, which lowers the sensitivity of  mammography.   FINDINGS: A BB indicating the palpable site of concern has been placed over the superior aspect of the left breast. Deep to this marker, there is an obscured oval mass measuring approximately 3 cm. Slightly more distal from the palpable lump is an asymmetry/possible distortion in the upper outer middle depth of the left breast. About 2 cm posterior from the questionable distortion is a low-density oval mass measuring approximately 1.4 cm. The right breast lumpectomy site is stable. No suspicious calcifications, masses or areas of distortion are seen in the right breast.   Physical exam of the palpable site in the superior left breast demonstrates a firm lump in the superior retroareolar left breast.   Ultrasound targeted to the palpable site in the left breast at 12 o'clock, 2 cm from the nipple demonstrates an anechoic oval circumscribed mass measuring 3.2 x 2.1 x 2.4 cm, consistent with a benign cyst.   Just superficial to this mass at 12 and 1 o'clock, 1 cm from the nipple are 2 adjacent oval circumscribed hypoechoic mass with internal echogenic foci. The mass at 12 o'clock, 1 cm from the nipple measures 0.6 x 0 x 0.6 cm the mass at 1 o'clock, 1 cm from the nipple measures 0.7 x 0.5 x 0.6 cm.   The patient's breast tissue generally is very complex in the upper outer quadrant with multiple other scattered cysts and clusters of cysts are seen throughout. There  also scattered areas of shadowing hypoechoic tissue.   The mass likely representing the one seen in the upper-outer posterior left breast on the mammogram is at 1 o'clock, 7 cm from the nipple measuring 1.2 cm.   There is no definite correlate in the upper-outer quadrant for the asymmetry/possible distortion found on the mammogram.   Ultrasound of the left axilla demonstrates multiple normal-appearing lymph nodes.   IMPRESSION: 1. The palpable lump in the left breast corresponds with a 3.2 cm benign  cyst.   2. There are 2 masses at 12 in 1 o'clock, 1 cm from the nipple, which may represent either complicated cysts or fibroadenomas. However, given the patient's history, evaluation with aspiration/biopsy is recommended.   3. There is no definite sonographic correlate for the asymmetry/possible distortion in the upper-outer left breast.   4. No evidence of left axillary lymphadenopathy.   5. Stable right breast lumpectomy site. No evidence of right breast malignancy.   RECOMMENDATION: 1. Ultrasound-guided aspiration/possible biopsy is recommended for the 2 left breast masses at 12 o'clock and 1 o'clock.   2. Ultrasound-guided aspiration is desired by the patient for the palpable cyst in the left breast at 12 o'clock.   3. Stereotactic biopsy is recommended for the asymmetry/possible distortion in the upper-outer left breast. All of these recommended procedures have been scheduled for 07/02/2021 at 7:30 a.m.   4. Consider adding annual screening MRI for additional screening given the patient's prior history of breast cancer in the complexity and density of her breast tissue.   I have discussed the findings and recommendations with the patient. If applicable, a reminder letter will be sent to the patient regarding the next appointment.   BI-RADS CATEGORY 4: Suspicious.     Electronically Signed By: Ammie Ferrier M.D. On: 06/06/2021 15:04 Assessment and Plan:   Diagnoses and all orders for this visit:  Malignant neoplasm of overlapping sites of left breast in female, estrogen receptor positive (CMS-HCC)    Discussed breast conservation versus mastectomy reconstruction. The patient is opted for left breast seed localized lumpectomy with left axillary sentinel lymph node mapping. Obtain MRI due to lobular phenotype  I told her surgery plan could change depending on that but she would like to conserve her breast just like she did in 2014 on the right side. Risk of  bleeding infection lymphedema arm stiffness chest deafness numbness in the axilla and the need for excisional surgery or further surgery discussed.The procedure has been discussed with the patient. Alternatives to surgery have been discussed with the patient. Risks of surgery include bleeding, Infection, Seroma formation, death, and the need for further surgery. The patient understands and wishes to proceed.  No follow-ups on file.  Kennieth Francois, MD  Total time 45 minutes face-to-face, examination, documentation, reviewing surgery complications, reviewing cancer treatment options, and review of records/images/pathology

## 2021-08-28 NOTE — Anesthesia Procedure Notes (Signed)
Anesthesia Regional Block: Pectoralis block   Pre-Anesthetic Checklist: , timeout performed,  Correct Patient, Correct Site, Correct Laterality,  Correct Procedure, Correct Position, site marked,  Risks and benefits discussed,  Surgical consent,  Pre-op evaluation,  At surgeon's request and post-op pain management  Laterality: Left  Prep: chloraprep       Needles:  Injection technique: Single-shot  Needle Type: Echogenic Stimulator Needle     Needle Length: 9cm  Needle Gauge: 21     Additional Needles:   Procedures:,,,, ultrasound used (permanent image in chart),,    Narrative:  Start time: 08/28/2021 7:05 AM End time: 08/28/2021 7:10 AM Injection made incrementally with aspirations every 5 mL.  Performed by: Personally  Anesthesiologist: Effie Berkshire, MD  Additional Notes: Patient tolerated the procedure well. Local anesthetic introduced in an incremental fashion under minimal resistance after negative aspirations. No paresthesias were elicited. After completion of the procedure, no acute issues were identified and patient continued to be monitored by RN.

## 2021-08-28 NOTE — Discharge Instructions (Signed)
[facility] Office Phone Number [phone]  BREAST BIOPSY/ PARTIAL MASTECTOMY: POST OP INSTRUCTIONS  Always review your discharge instruction sheet given to you by the facility where your surgery was performed.  IF YOU HAVE DISABILITY OR FAMILY LEAVE FORMS, YOU MUST BRING THEM TO THE OFFICE FOR PROCESSING.  DO NOT GIVE THEM TO YOUR DOCTOR.  A prescription for pain medication may be given to you upon discharge.  Take your pain medication as prescribed, if needed.  If narcotic pain medicine is not needed, then you may take acetaminophen (Tylenol) or ibuprofen (Advil) as needed. Take your usually prescribed medications unless otherwise directed If you need a refill on your pain medication, please contact your pharmacy.  They will contact our office to request authorization.  Prescriptions will not be filled after 5pm or on week-ends. You should eat very light the first 24 hours after surgery, such as soup, crackers, pudding, etc.  Resume your normal diet the day after surgery. Most patients will experience some swelling and bruising in the breast.  Ice packs and a good support bra will help.  Swelling and bruising can take several days to resolve.  It is common to experience some constipation if taking pain medication after surgery.  Increasing fluid intake and taking a stool softener will usually help or prevent this problem from occurring.  A mild laxative (Milk of Magnesia or Miralax) should be taken according to package directions if there are no bowel movements after 48 hours. Unless discharge instructions indicate otherwise, you may remove your bandages 24-48 hours after surgery, and you may shower at that time.  You may have steri-strips (small skin tapes) in place directly over the incision.  These strips should be left on the skin for 7-10 days.  If your surgeon used skin glue on the incision, you may shower in 24 hours.  The glue will flake off over the next 2-3 weeks.  Any  sutures or staples will be removed at the office during your follow-up visit. ACTIVITIES:  You may resume regular daily activities (gradually increasing) beginning the next day.  Wearing a good support bra or sports bra minimizes pain and swelling.  You may have sexual intercourse when it is comfortable. You may drive when you no longer are taking prescription pain medication, you can comfortably wear a seatbelt, and you can safely maneuver your car and apply brakes. RETURN TO WORK:  ______________________________________________________________________________________ You should see your doctor in the office for a follow-up appointment approximately two weeks after your surgery.  Your doctor's nurse will typically make your follow-up appointment when she calls you with your pathology report.  Expect your pathology report 2-3 business days after your surgery.  You may call to check if you do not hear from us after three days. OTHER INSTRUCTIONS: _______________________________________________________________________________________________ _____________________________________________________________________________________________________________________________________ _____________________________________________________________________________________________________________________________________ _____________________________________________________________________________________________________________________________________  WHEN TO CALL YOUR DOCTOR: Fever over 101.0 Nausea and/or vomiting. Extreme swelling or bruising. Continued bleeding from incision. Increased pain, redness, or drainage from the incision.  The clinic staff is available to answer your questions during regular business hours.  Please don't hesitate to call and ask to speak to one of the nurses for clinical concerns.  If you have a medical emergency, go to the nearest emergency room or call 911.  A surgeon from [facility] is always on call at the hospital.  For further questions, please visit [email or web address]   

## 2021-08-28 NOTE — Anesthesia Postprocedure Evaluation (Signed)
Anesthesia Post Note  Patient: Caitlin Mcneil  Procedure(s) Performed: LEFT BREAST LUMPECTOMY WITH RADIOACTIVE SEED AND SENTINEL LYMPH NODE BIOPSY (Left: Breast)     Patient location during evaluation: PACU Anesthesia Type: General Level of consciousness: awake and alert Pain management: pain level controlled Vital Signs Assessment: post-procedure vital signs reviewed and stable Respiratory status: spontaneous breathing, nonlabored ventilation, respiratory function stable and patient connected to nasal cannula oxygen Cardiovascular status: blood pressure returned to baseline and stable Postop Assessment: no apparent nausea or vomiting Anesthetic complications: no   No notable events documented.  Last Vitals:  Vitals:   08/28/21 0952 08/28/21 1007  BP: (!) 159/88 (!) 159/85  Pulse: 83 80  Resp: 16 17  Temp:  36.7 C  SpO2: 95% 98%    Last Pain:  Vitals:   08/28/21 1007  TempSrc:   PainSc: [place] Latesa Fratto

## 2021-08-28 NOTE — Anesthesia Preprocedure Evaluation (Addendum)
Anesthesia Evaluation  Patient identified by MRN, date of birth, ID band Patient awake    Reviewed: Allergy & Precautions, NPO status , Patient's Chart, lab work & pertinent test results  Airway Mallampati: II  TM Distance: >3 FB Neck ROM: Full    Dental  (+) Teeth Intact, Dental Advisory Given   Pulmonary neg pulmonary ROS,    breath sounds clear to auscultation       Cardiovascular negative cardio ROS   Rhythm:Regular Rate:Normal     Neuro/Psych PSYCHIATRIC DISORDERS Anxiety Depression Bipolar Disorder negative neurological ROS     GI/Hepatic negative GI ROS, Neg liver ROS,   Endo/Other  diabetes  Renal/GU negative Renal ROS     Musculoskeletal  (+) Arthritis ,   Abdominal Normal abdominal exam  (+)   Peds  Hematology   Anesthesia Other Findings   Reproductive/Obstetrics                            Anesthesia Physical Anesthesia Plan  ASA: 2  Anesthesia Plan: General   Post-op Pain Management: Regional block   Induction: Intravenous  PONV Risk Score and Plan: 4 or greater and Ondansetron, Dexamethasone, Midazolam and Scopolamine patch - Pre-op  Airway Management Planned: LMA  Additional Equipment: None  Intra-op Plan:   Post-operative Plan: Extubation in OR  Informed Consent: I have reviewed the patients History and Physical, chart, labs and discussed the procedure including the risks, benefits and alternatives for the proposed anesthesia with the patient or authorized representative who has indicated his/her understanding and acceptance.     Dental advisory given  Plan Discussed with: CRNA  Anesthesia Plan Comments:        Anesthesia Quick Evaluation

## 2021-08-29 ENCOUNTER — Encounter (HOSPITAL_COMMUNITY): Payer: Self-pay | Admitting: Surgery

## 2021-09-02 ENCOUNTER — Encounter: Payer: Self-pay | Admitting: Surgery

## 2021-09-02 LAB — SURGICAL PATHOLOGY

## 2021-09-04 ENCOUNTER — Encounter: Payer: Self-pay | Admitting: *Deleted

## 2021-09-05 NOTE — Progress Notes (Signed)
Patient Care Team: Kathalene Frames, MD as PCP - General (Internal Medicine) Rockwell Germany, RN as Oncology Nurse Navigator Mauro Kaufmann, RN as Oncology Nurse Navigator Nicholas Lose, MD as Consulting Physician (Hematology and Oncology)  DIAGNOSIS:    ICD-10-CM   1. Malignant neoplasm of lower-outer quadrant of right breast of female, estrogen receptor positive ([place])  C50.511    Z17.0       SUMMARY OF ONCOLOGIC HISTORY: Oncology History  Breast cancer of lower-outer quadrant of right female breast ([place])  05/02/2013 Surgery   Right breast lumpectomy: Tumor size 0.35 cm, intermediate grade, 3 sentinel nodes negative, ER/PR HER-2 negative, T1 N0 M0 stage IA   06/06/2013 - 07/21/2013 Radiation Therapy   Adjuvant radiation therapy   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, estrogen receptor positive ([place])  07/02/2021 Initial Diagnosis   Left Breast Palpable lesion: 3.2 cm cyst, 2 adj masses 0.6 cm, and 0.7 cm Biopsy 07/02/21: ILC Grade 1, ER: 90%, PR 100%, Her 2: Neg, Ki 67: 2%     CHIEF COMPLIANT: Follow-up of right breast cancer  INTERVAL HISTORY: Caitlin Mcneil is a 66 y.o. with above-mentioned history of right breast cancer.  Left lumpectomy on 08/28/2021 showed invasive lobular carcinoma and grade 1 DCIS in the upper outer quadrant, LCIS in the inferior quadrant, and axillary lymph nodes negative for carcinoma. She presents to the clinic today for follow-up.  Since she started estradiol patches, she has been getting more more hot flashes.  ALLERGIES:  is allergic to carbamazepine, penicillins, aspirin, ciprofloxacin, and ibuprofen.  MEDICATIONS:  Current Outpatient Medications  Medication Sig Dispense Refill   acetaminophen (TYLENOL) 650 MG CR tablet Take 1,300 mg by mouth daily.     Apoaequorin (PREVAGEN) 10 MG CAPS Take 10 mg by mouth daily.     B Complex Vitamins (VITAMIN B COMPLEX) TABS Take 1 tablet by mouth every evening.      carboxymethylcellulose (REFRESH PLUS) 0.5 % SOLN Place 1 drop into both eyes daily.     cetirizine (ZYRTEC) 10 MG tablet Take 10 mg by mouth daily.     fluticasone (FLONASE) 50 MCG/ACT nasal spray Place 2 sprays into both nostrils daily.     Guaifenesin 1200 MG TB12 Take 1,200 mg by mouth daily.     hydrOXYzine (ATARAX) 25 MG tablet Take 50 mg by mouth at bedtime.     melatonin 1 MG TABS tablet Take 2 mg by mouth at bedtime.     Multiple Vitamins-Minerals (MULTIVITAMIN ADULT EXTRA C PO) Take 1 tablet by mouth daily.     Omega-3 Fatty Acids (FISH OIL PO) Take 1 capsule by mouth every evening.     oxyCODONE (OXY IR/ROXICODONE) 5 MG immediate release tablet Take 1 tablet (5 mg total) by mouth every 6 (six) hours as needed for severe pain. 15 tablet 0   pregabalin (LYRICA) 100 MG capsule Take 100 mg by mouth 2 (two) times daily.     Probiotic Product (PROBIOTIC PO) Take 1 capsule by mouth daily.     QUEtiapine (SEROQUEL) 100 MG tablet Take 1 tablet (100 mg total) by mouth at bedtime. 30 tablet 0   temazepam (RESTORIL) 15 MG capsule Take 1 capsule (15 mg total) by mouth at bedtime as needed for sleep. (Patient taking differently: Take 15 mg by mouth at bedtime.) 30 capsule 0   No current facility-administered medications for this visit.    PHYSICAL EXAMINATION: ECOG PERFORMANCE STATUS: 1 -  Symptomatic but completely ambulatory  Vitals:   09/08/21 1520  BP: (!) 165/88  Pulse: 78  Resp: 18  Temp: (!) 97.2 F (36.2 C)  SpO2: 99%   Filed Weights   09/08/21 1520  Weight: 197 lb 6.4 oz (89.5 kg)      LABORATORY DATA:  I have reviewed the data as listed CMP Latest Ref Rng & Units 08/25/2021 12/24/2014 12/23/2014  Glucose 70 - 99 mg/dL 122(H) 82 104(H)  BUN 8 - 23 mg/dL 21 19 21(H)  Creatinine 0.44 - 1.00 mg/dL 0.73 0.68 0.67  Sodium 135 - 145 mmol/L 140 143 140  Potassium 3.5 - 5.1 mmol/L 4.3 4.3 4.2  Chloride 98 - 111 mmol/L 105 109 106  CO2 22 - 32 mmol/L _0 Calcium 8.9 -  10.3 mg/dL 9.3 8.7(L) 8.9  Total Protein 6.5 - 8.1 g/dL - - 6.7  Total Bilirubin 0.3 - 1.2 mg/dL - - 0.6  Alkaline Phos 38 - 126 U/L - - 44  AST 15 - 41 U/L - - 15  ALT 14 - 54 U/L - - 12(L)    Lab Results  Component Value Date   WBC 7.1 08/25/2021   HGB 13.4 08/25/2021   HCT 40.7 08/25/2021   MCV 90.6 08/25/2021   PLT 152 08/25/2021   NEUTROABS 6.3 09/18/2014    ASSESSMENT & PLAN:  Breast cancer of lower-outer quadrant of right female breast ([place]) 07/02/2021: Left Breast Palpable lesion: 3.2 cm cyst, 2 adj masses 0.6 cm, and 0.7 cm Biopsy 07/02/21: ILC Grade 1, ER: 90%, PR 100%, Her 2: Neg, Ki 67: 2%  08/28/2021: Left lumpectomy: Grade 1 through 2 invasive lobular cancer presenting as few isolated tumor cells, grade 1 DCIS, LCIS, 0/3 lymph nodes negative (tumor size 4 mm) ER 90%, PR 100%, HER2 negative, Ki-67 2%  Pathology counseling: I discussed the final pathology report of the patient provided  a copy of this report. I discussed the margins as well as lymph node surgeries. We also discussed the final staging along with previously performed ER/PR and HER-2/neu testing.  Treatment plan: 1.  No role of Oncotype DX testing because of the small size 2. adjuvant radiation 3.  Adjuvant antiestrogen therapy  She has tapered off and discontinued estradiol patches. Severe hot flashes: I sent a prescription for Effexor 37.5 mg daily today.  Counseled her about risks and benefits. She needs new forms for FMLA completed.  She will bring those forms.  Return to clinic after radiation is complete    No orders of the defined types were placed in this encounter.  The patient has a good understanding of the overall plan. she agrees with it. she will call with any problems that may develop before the next visit here.  Total time spent: 30 mins including face to face time and time spent for planning, charting and coordination of care  Rulon Eisenmenger, MD, MPH 09/08/2021  I, Thana Ates,  am acting as scribe for Dr. Nicholas Lose.  I have reviewed the above documentation for accuracy and completeness, and I agree with the above.

## 2021-09-08 ENCOUNTER — Inpatient Hospital Stay
Payer: No Typology Code available for payment source | Attending: Hematology and Oncology | Admitting: Hematology and Oncology

## 2021-09-08 ENCOUNTER — Encounter: Payer: Self-pay | Admitting: *Deleted

## 2021-09-08 ENCOUNTER — Other Ambulatory Visit: Payer: Self-pay | Admitting: *Deleted

## 2021-09-08 ENCOUNTER — Other Ambulatory Visit: Payer: Self-pay

## 2021-09-08 DIAGNOSIS — Z170 Estrogen receptor positive status [ER+]: Secondary | ICD-10-CM | POA: Diagnosis not present

## 2021-09-08 DIAGNOSIS — Z853 Personal history of malignant neoplasm of breast: Secondary | ICD-10-CM | POA: Diagnosis not present

## 2021-09-08 DIAGNOSIS — N6002 Solitary cyst of left breast: Secondary | ICD-10-CM | POA: Insufficient documentation

## 2021-09-08 DIAGNOSIS — Z880 Allergy status to penicillin: Secondary | ICD-10-CM | POA: Insufficient documentation

## 2021-09-08 DIAGNOSIS — Z79899 Other long term (current) drug therapy: Secondary | ICD-10-CM | POA: Diagnosis not present

## 2021-09-08 DIAGNOSIS — Z881 Allergy status to other antibiotic agents status: Secondary | ICD-10-CM | POA: Diagnosis not present

## 2021-09-08 DIAGNOSIS — Z886 Allergy status to analgesic agent status: Secondary | ICD-10-CM | POA: Insufficient documentation

## 2021-09-08 DIAGNOSIS — C50012 Malignant neoplasm of nipple and areola, left female breast: Secondary | ICD-10-CM | POA: Diagnosis present

## 2021-09-08 DIAGNOSIS — R232 Flushing: Secondary | ICD-10-CM | POA: Diagnosis not present

## 2021-09-08 DIAGNOSIS — C50511 Malignant neoplasm of lower-outer quadrant of right female breast: Secondary | ICD-10-CM | POA: Diagnosis not present

## 2021-09-08 MED ORDER — VENLAFAXINE HCL ER 37.5 MG PO CP24: 37.5000 mg | ORAL_CAPSULE | Freq: Every day | ORAL | 3 refills | Status: DC

## 2021-09-08 NOTE — Assessment & Plan Note (Signed)
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 I discussed the margins as well as lymph node surgeries. We also discussed the final staging along with previously performed ER/PR and HER-2/neu testing.  Treatment plan: 1.  No role of Oncotype DX testing because of the small size 2. adjuvant radiation 3.  Adjuvant antiestrogen therapy  She has tapered off and discontinued estradiol patches. Return to clinic after radiation is complete

## 2021-09-10 ENCOUNTER — Telehealth: Payer: Self-pay

## 2021-09-10 NOTE — Telephone Encounter (Signed)
Spoke w/ patient, and verified identity. Patient asked about her appointment schedule on 10/02/21. I explained her schedule for that day. Patient also states "She would like help extending her FMLA." I emailed that department at [email or web address] and requested that they reach out to patient as soon as possible. Patient also states "She is claustrophobic and may need something to calm her down during her treatments." I relayed to patient that we have multiple ways of taking care of this need and that I will pass this information to her provider [facility] PA-C. I left my extension [phone] in case patient needs anything. Patient verbalized understanding of all information.

## 2021-09-23 ENCOUNTER — Encounter: Payer: Self-pay | Admitting: Radiation Oncology

## 2021-09-23 NOTE — Progress Notes (Incomplete)
Spoke w/ patient, verified identity, and began nursing interview by phone. Patient reports LT breast tenderness 1/10. No other issues reported at this time.  Meaningful use complete. Hysterectomy- No chances of pregnancy.  Patient reminded and verbally understood her 1:00pm-10/02/21 in-person appointment w/ Shona Simpson PA-C.  Vitals pending appointment date.Marland KitchenMarland Kitchen

## 2021-10-01 ENCOUNTER — Telehealth: Payer: Self-pay | Admitting: *Deleted

## 2021-10-01 NOTE — Telephone Encounter (Signed)
FMLA extension successfully faxed to [place] at [phone].  Original copy to alphabetical file folder behind appointment registration area one for patient pickup. ? ? ?

## 2021-10-02 ENCOUNTER — Ambulatory Visit
Admission: RE | Admit: 2021-10-02 | Discharge: 2021-10-02 | Disposition: A | Discharge status: Home / Self Care | Payer: No Typology Code available for payment source | Source: Ambulatory Visit | Attending: Radiation Oncology | Admitting: Radiation Oncology

## 2021-10-02 ENCOUNTER — Other Ambulatory Visit: Payer: Self-pay

## 2021-10-02 ENCOUNTER — Encounter: Payer: Self-pay | Admitting: Radiation Oncology

## 2021-10-02 VITALS — BP 149/106 | HR 73 | Resp 20 | Wt 194.0 lb

## 2021-10-02 DIAGNOSIS — F319 Bipolar disorder, unspecified: Secondary | ICD-10-CM | POA: Insufficient documentation

## 2021-10-02 DIAGNOSIS — E119 Type 2 diabetes mellitus without complications: Secondary | ICD-10-CM | POA: Insufficient documentation

## 2021-10-02 DIAGNOSIS — M129 Arthropathy, unspecified: Secondary | ICD-10-CM | POA: Insufficient documentation

## 2021-10-02 DIAGNOSIS — Z79899 Other long term (current) drug therapy: Secondary | ICD-10-CM | POA: Insufficient documentation

## 2021-10-02 DIAGNOSIS — Z800 Family history of malignant neoplasm of digestive organs: Secondary | ICD-10-CM | POA: Insufficient documentation

## 2021-10-02 DIAGNOSIS — C50012 Malignant neoplasm of nipple and areola, left female breast: Secondary | ICD-10-CM | POA: Insufficient documentation

## 2021-10-02 DIAGNOSIS — Z170 Estrogen receptor positive status [ER+]: Secondary | ICD-10-CM | POA: Insufficient documentation

## 2021-10-02 DIAGNOSIS — C50412 Malignant neoplasm of upper-outer quadrant of left female breast: Secondary | ICD-10-CM | POA: Insufficient documentation

## 2021-10-02 DIAGNOSIS — Z923 Personal history of irradiation: Secondary | ICD-10-CM | POA: Insufficient documentation

## 2021-10-02 DIAGNOSIS — Z510 Encounter for antineoplastic radiation therapy: Secondary | ICD-10-CM | POA: Insufficient documentation

## 2021-10-02 DIAGNOSIS — Z853 Personal history of malignant neoplasm of breast: Secondary | ICD-10-CM | POA: Insufficient documentation

## 2021-10-02 DIAGNOSIS — Z803 Family history of malignant neoplasm of breast: Secondary | ICD-10-CM | POA: Insufficient documentation

## 2021-10-02 NOTE — Progress Notes (Signed)
Patient is here today for a follow-up new appointment . Denies any pain. Patient denies any lymphoedema.Patient states that she does not have a Psychologist, forensic. Patient denies being pregnant. Patient denies taking Methotrexate  before. Patient states that she had breast cancer before in her rt breast and that she was treated with radiation.Patients blood pressure was elevated denies any chest pains ,headaches or and other s/s of hypertension. States that she is anxious due to this appointment today. ?Vitals:  ? 09/23/21 1542  ?BP: (!) 149/106  ?Pulse: 73  ?Resp: 20  ?SpO2: 99%  ?Weight: 88 kg  ? ? ?

## 2021-10-02 NOTE — Progress Notes (Signed)
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   HISTORY OF PRESENT ILLNESS: Caitlin Mcneil is a 66 y.o. female seen at the request of Dr. Brantley Stage for a new diagnosis of left breast cancer.  The patient was originally noted to have a palpable left breast mass with diagnostic imaging showing asymmetric changes/distortion.  She has a history of right breast cancer treated in 2014 with lumpectomy and radiation.  When she presented for her diagnostic work-up the palpable area in the left breast was consistent with 2 cysts benign in nature, the distortion was seen at 1 o'clock position with no ultrasound correlate and her left axilla was negative for adenopathy.  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  Her tumor was ER/PR positive, HER2 negative with a Ki-67 of 2%.  She is seen today to discuss treatment recommendations of her cancer.  An MRI on 08/06/21 revealed a 2 cm mass consistent with her known biopsy site and non mass enhancement measuring up to 4.3 cm, and another area of clumped non mass enhancement in the left breast measuring 1.1 cm.    Since her last visit, she has undergone biopsy of her left breast on 08/15/21 that showed atypical lobular hyperplasia in the upper inner quadrant specimen, and LCIS in the LOQ specimen. No malignancy was noted of either specimen. She underwent left lumpectomy x2 and sentinel node biopsy on 08/28/21. The upper outer quadrant specimen showed a few isolated cells  of invasive lobular carcinoma focally 1 mm from the medial  margin, and associated low grade DCIS 1 mm from the inferior margin and associated LCIS and microcalcifications. The inferior specimen showed no invasive disease and LCIS with microcalcficiations. 3 sampled nodes were negative for disease.     PREVIOUS RADIATION THERAPY: Yes   06/05/2013 through 07/21/2013: The right breast was treated by Dr. Lisbeth Renshaw using whole breast tangent fields. In this manner the patient was treated to 50.4 gray at 1.8 gray per fraction. The patient then received a boost using an en face electron field for an additional 10 gray at 2 gray per fraction using 18 MeV electrons. The patient's final dose was 60.4 gray in 33 fractions.     PAST MEDICAL HISTORY:  Past Medical History:  Diagnosis Date   Anxiety    Arthritis    hands   Bipolar 1 disorder ([place])    Breast cancer ([place]) 03/30/2013   right breast   Breast cancer ([place]) 07/02/2021   Left Breast   Contact lens/glasses fitting    wears contacts or glasses   Depression    Diabetes mellitus without complication ([place])    type 2   History of radiation therapy 110/10/14-12/26/14   right breast, 60.4Gy   Personal history of radiation therapy 2014   right breast       PAST SURGICAL HISTORY: Past Surgical History:  Procedure Laterality Date   ABDOMINAL HYSTERECTOMY  1996   BREAST BIOPSY Right 03/30/2013   Ductal carcinoma in situ/calcifications,loq   BREAST LUMPECTOMY Right 05/02/2013   BREAST LUMPECTOMY  WITH NEEDLE LOCALIZATION AND AXILLARY SENTINEL LYMPH NODE BX Right 05/02/2013   Procedure: BREAST LUMPECTOMY WITH NEEDLE LOCALIZATION AND AXILLARY SENTINEL LYMPH NODE BX;  Surgeon: Joyice Faster. Cornett, MD;  Location: [facility];  Service: General;  Laterality: Right;   BREAST LUMPECTOMY WITH RADIOACTIVE SEED AND SENTINEL LYMPH NODE BIOPSY Left 08/28/2021   Procedure: LEFT BREAST LUMPECTOMY WITH RADIOACTIVE SEED AND SENTINEL LYMPH NODE BIOPSY;  Surgeon: Erroll Luna, MD;  Location: [facility];  Service:  General;  Laterality: Left;   FOOT SURGERY  2002   lt hammer toe-reconstruction   HARDWARE REMOVAL  2002   lt  foot hardware out, pt thinks she still has a pin in her left ankle   WISDOM TOOTH EXTRACTION       FAMILY HISTORY:  Family History  Problem Relation Age of Onset   Colon cancer Father 63   Alzheimer's disease Mother    Alzheimer's disease Maternal Grandmother    Cancer Maternal Grandfather        Cancer - NOS   Breast cancer Other        maternal great aunt     SOCIAL HISTORY:  reports that she has never smoked. She has never used smokeless tobacco. She reports that she does not drink alcohol and does not use drugs.  The patient is married and lives in [place]. She works in a Stage [employer].    ALLERGIES: Carbamazepine, Penicillins, Aspirin, Ciprofloxacin, and Ibuprofen   MEDICATIONS:  Current Outpatient Medications  Medication Sig Dispense Refill   acetaminophen (TYLENOL) 650 MG CR tablet Take 1,300 mg by mouth daily.     Apoaequorin (PREVAGEN) 10 MG CAPS Take 10 mg by mouth daily.     B Complex Vitamins (VITAMIN B COMPLEX) TABS Take 1 tablet by mouth every evening.     carboxymethylcellulose (REFRESH PLUS) 0.5 % SOLN Place 1 drop into both eyes daily.     cetirizine (ZYRTEC) 10 MG tablet Take 10 mg by mouth daily.     fluticasone (FLONASE) 50 MCG/ACT nasal spray Place 2 sprays into both nostrils daily.     Guaifenesin 1200 MG TB12 Take 1,200 mg by mouth daily.     hydrOXYzine (ATARAX) 25 MG tablet Take 50 mg by mouth at bedtime.     melatonin 1 MG TABS tablet Take 2 mg by mouth at bedtime.     Multiple Vitamins-Minerals (MULTIVITAMIN ADULT EXTRA C PO) Take 1 tablet by mouth daily.     Omega-3 Fatty Acids (FISH OIL PO) Take 1 capsule by mouth every evening.     oxyCODONE (OXY IR/ROXICODONE) 5 MG immediate release tablet Take 1 tablet (5 mg total) by mouth every 6 (six) hours as needed for severe pain. (Patient not taking: Reported on 09/23/2021) 15 tablet  0   pregabalin (LYRICA) 100 MG capsule Take 100 mg by mouth 2 (two) times daily.     Probiotic Product (PROBIOTIC PO) Take 1 capsule by mouth daily.     QUEtiapine (SEROQUEL) 100 MG tablet Take 1 tablet (100 mg total) by mouth at bedtime. 30 tablet 0   temazepam (RESTORIL) 15 MG capsule Take 1 capsule (15 mg total) by mouth at bedtime as needed for sleep. (Patient taking differently: Take 15 mg by mouth at bedtime.) 30 capsule 0   venlafaxine XR (EFFEXOR-XR) 37.5 MG 24 hr capsule Take 1 capsule (37.5 mg total) by mouth daily with breakfast. 90 capsule 3   No current facility-administered medications for this encounter.  REVIEW OF SYSTEMS: On review of systems, the patient reports that she is doing okay. She denies any concerns about her skin or healing. She reports good range of motion of her LUE. No other complaints are verbalized.      PHYSICAL EXAM:  Wt Readings from Last 3 Encounters:  09/08/21 197 lb 6.4 oz (89.5 kg)  08/28/21 195 lb (88.5 kg)  08/25/21 196 lb 3.2 oz (89 kg)   Temp Readings from Last 3 Encounters:  09/08/21 (!) 97.2 F (36.2 C) (Temporal)  08/28/21 98.1 F (36.7 C)  08/25/21 98.4 F (36.9 C) (Oral)   BP Readings from Last 3 Encounters:  09/08/21 (!) 165/88  08/28/21 (!) 159/85  08/25/21 140/90   Pulse Readings from Last 3 Encounters:  09/08/21 78  08/28/21 80  08/25/21 72    In general this is a well appearing caucasian female in no acute distress. She's alert and oriented x4 and appropriate throughout the examination. Cardiopulmonary assessment is negative for acute distress and she exhibits normal effort. The left breast reveals a well healed lumpectomy and axillary incisions. All incisions are intact without erythema, separation, or drainage.     ECOG = 0  0 - Asymptomatic (Fully active, able to carry on all predisease activities without restriction)  1 - Symptomatic but completely ambulatory (Restricted in physically strenuous activity but  ambulatory and able to carry out work of a light or sedentary nature. For example, light housework, office work)  2 - Symptomatic, <50% in bed during the day (Ambulatory and capable of all self care but unable to carry out any work activities. Up and about more than 50% of waking hours)  3 - Symptomatic, >50% in bed, but not bedbound (Capable of only limited self-care, confined to bed or chair 50% or more of waking hours)  4 - Bedbound (Completely disabled. Cannot carry on any self-care. Totally confined to bed or chair)  5 - Death   Eustace Pen MM, Creech RH, Tormey DC, et al. [phone]). "Toxicity and response criteria of the [facility]". [place] Oncol. 5 (6): 649-55    LABORATORY DATA:  Lab Results  Component Value Date   WBC 7.1 08/25/2021   HGB 13.4 08/25/2021   HCT 40.7 08/25/2021   MCV 90.6 08/25/2021   PLT 152 08/25/2021   Lab Results  Component Value Date   NA 140 08/25/2021   K 4.3 08/25/2021   CL 105 08/25/2021   CO2 30 08/25/2021   Lab Results  Component Value Date   ALT 12 (L) 12/23/2014   AST 15 12/23/2014   ALKPHOS 44 12/23/2014   BILITOT 0.6 12/23/2014      RADIOGRAPHY: No results found.     IMPRESSION/PLAN: 1. Stage IA, [id number] grade 1-2 ER/PR positive invasive lobular carcinoma of the left breast. Dr. Lisbeth Renshaw has reviewed her case. Today, I discussed the pathology findings and reviews the nature of early stage breast disease. She is healing well from surgery. I reviewed Dr. Ida Rogue recommendations for external radiotherapy to the breast  to reduce risks of local recurrence followed by antiestrogen therapy. We discussed the risks, benefits, short, and long term effects of radiotherapy, as well as the curative intent, and the patient is interested in proceeding. I reviewed the delivery and logistics of radiotherapy and that Dr. Lisbeth Renshaw recommends 4 weeks of radiotherapy to the left breast with deep inspiration breath hold technique. Written  consent is obtained and placed in the chart, a copy was provided to  the patient. She will simulate today. 2. History of Right breast cancer. She will be followed in surveillance for this along with #1.      In a visit lasting 45 minutes, greater than 50% of the time was spent face to face reviewing her case, as well as in preparation of, discussing, and coordinating the patient's care.       Carola Rhine, [facility]    **Disclaimer: This note was dictated with voice recognition software. Similar sounding words can inadvertently be transcribed and this note may contain transcription errors which may not have been corrected upon publication of note.**

## 2021-10-07 DIAGNOSIS — Z510 Encounter for antineoplastic radiation therapy: Secondary | ICD-10-CM | POA: Diagnosis not present

## 2021-10-09 ENCOUNTER — Encounter: Payer: Self-pay | Admitting: *Deleted

## 2021-10-09 ENCOUNTER — Ambulatory Visit
Admission: RE | Admit: 2021-10-09 | Discharge: 2021-10-09 | Disposition: A | Discharge status: Home / Self Care | Payer: No Typology Code available for payment source | Source: Ambulatory Visit | Attending: Radiation Oncology | Admitting: Radiation Oncology

## 2021-10-09 ENCOUNTER — Other Ambulatory Visit: Payer: Self-pay

## 2021-10-09 ENCOUNTER — Encounter (HOSPITAL_COMMUNITY): Payer: Self-pay

## 2021-10-09 DIAGNOSIS — Z510 Encounter for antineoplastic radiation therapy: Secondary | ICD-10-CM | POA: Diagnosis not present

## 2021-10-10 ENCOUNTER — Ambulatory Visit
Admission: RE | Admit: 2021-10-10 | Discharge: 2021-10-10 | Disposition: A | Discharge status: Home / Self Care | Payer: No Typology Code available for payment source | Source: Ambulatory Visit | Attending: Radiation Oncology | Admitting: Radiation Oncology

## 2021-10-10 DIAGNOSIS — Z510 Encounter for antineoplastic radiation therapy: Secondary | ICD-10-CM | POA: Diagnosis not present

## 2021-10-10 DIAGNOSIS — C50012 Malignant neoplasm of nipple and areola, left female breast: Secondary | ICD-10-CM

## 2021-10-10 MED ORDER — SONAFINE EX EMUL
1.0000 "application " | Freq: Once | CUTANEOUS | Status: AC
  Administered 2021-10-10: 1 via TOPICAL

## 2021-10-10 NOTE — Progress Notes (Signed)
Pt here for patient teaching.  Pt given Radiation and You booklet, skin care instructions, and Sonafine.  Patient was advised to obtain over the counter aluminum free deodorant.  Reviewed areas of pertinence such as fatigue, hair loss, skin changes, breast tenderness, and breast swelling. Pt able to give teach back of to pat skin and use unscented/gentle soap,apply Sonafine bid, avoid applying anything to skin within 4 hours of treatment, avoid wearing an under wire bra, and to use an electric razor if they must shave. Pt verbalizes understanding of information given and will contact nursing with any questions or concerns.   ? ?Caitlin Mcneil. Leonie Green, BSN  ?

## 2021-10-13 ENCOUNTER — Ambulatory Visit
Admission: RE | Admit: 2021-10-13 | Discharge: 2021-10-13 | Disposition: A | Discharge status: Home / Self Care | Payer: No Typology Code available for payment source | Source: Ambulatory Visit | Attending: Radiation Oncology | Admitting: Radiation Oncology

## 2021-10-13 ENCOUNTER — Other Ambulatory Visit: Payer: Self-pay

## 2021-10-13 DIAGNOSIS — Z510 Encounter for antineoplastic radiation therapy: Secondary | ICD-10-CM | POA: Diagnosis not present

## 2021-10-14 ENCOUNTER — Ambulatory Visit
Admission: RE | Admit: 2021-10-14 | Discharge: 2021-10-14 | Disposition: A | Discharge status: Home / Self Care | Payer: No Typology Code available for payment source | Source: Ambulatory Visit | Attending: Radiation Oncology | Admitting: Radiation Oncology

## 2021-10-14 ENCOUNTER — Telehealth: Payer: Self-pay | Admitting: Hematology and Oncology

## 2021-10-14 DIAGNOSIS — Z510 Encounter for antineoplastic radiation therapy: Secondary | ICD-10-CM | POA: Diagnosis not present

## 2021-10-14 NOTE — Telephone Encounter (Signed)
.  Called patient to schedule appointment per 3/19 inbasket, patient is aware of date and time.   ?

## 2021-10-15 ENCOUNTER — Other Ambulatory Visit: Payer: Self-pay

## 2021-10-15 ENCOUNTER — Ambulatory Visit
Admission: RE | Admit: 2021-10-15 | Discharge: 2021-10-15 | Disposition: A | Discharge status: Home / Self Care | Payer: No Typology Code available for payment source | Source: Ambulatory Visit | Attending: Radiation Oncology | Admitting: Radiation Oncology

## 2021-10-15 DIAGNOSIS — Z510 Encounter for antineoplastic radiation therapy: Secondary | ICD-10-CM | POA: Diagnosis not present

## 2021-10-16 ENCOUNTER — Ambulatory Visit: Payer: No Typology Code available for payment source

## 2021-10-17 ENCOUNTER — Ambulatory Visit
Admission: RE | Admit: 2021-10-17 | Discharge: 2021-10-17 | Disposition: A | Discharge status: Home / Self Care | Payer: No Typology Code available for payment source | Source: Ambulatory Visit | Attending: Radiation Oncology | Admitting: Radiation Oncology

## 2021-10-17 DIAGNOSIS — Z510 Encounter for antineoplastic radiation therapy: Secondary | ICD-10-CM | POA: Diagnosis not present

## 2021-10-20 ENCOUNTER — Ambulatory Visit: Payer: No Typology Code available for payment source | Attending: Radiation Oncology

## 2021-10-20 ENCOUNTER — Ambulatory Visit
Admission: RE | Admit: 2021-10-20 | Discharge: 2021-10-20 | Disposition: A | Discharge status: Home / Self Care | Payer: No Typology Code available for payment source | Source: Ambulatory Visit | Attending: Radiation Oncology | Admitting: Radiation Oncology

## 2021-10-20 ENCOUNTER — Other Ambulatory Visit: Payer: Self-pay

## 2021-10-20 VITALS — Wt 194.5 lb

## 2021-10-20 DIAGNOSIS — Z510 Encounter for antineoplastic radiation therapy: Secondary | ICD-10-CM | POA: Diagnosis not present

## 2021-10-20 DIAGNOSIS — C50012 Malignant neoplasm of nipple and areola, left female breast: Secondary | ICD-10-CM | POA: Insufficient documentation

## 2021-10-20 DIAGNOSIS — Z483 Aftercare following surgery for neoplasm: Secondary | ICD-10-CM | POA: Insufficient documentation

## 2021-10-20 DIAGNOSIS — Z170 Estrogen receptor positive status [ER+]: Secondary | ICD-10-CM | POA: Insufficient documentation

## 2021-10-20 NOTE — Therapy (Signed)
?  OUTPATIENT PHYSICAL THERAPY SOZO SCREENING NOTE ? ? ?Patient Name: Caitlin Mcneil ?MRN: [id number] ?DOB:[birth year], 66 y.o., female ?Today's Date: 10/20/2021 ? ?PCP: Kathalene Frames, MD ?REFERRING PROVIDER: Hayden Pedro,* ? ? PT End of Session - 10/20/21 1620   ? ? Visit Number 1   ? PT Start Time [phone]   ? PT Stop Time 9470   ? PT Time Calculation (min) 13 min   ? Activity Tolerance Patient tolerated treatment well   ? Behavior During Therapy Caitlin Mcneil for tasks assessed/performed   ? ?  ?  ? ?  ? ? ?Past Medical History:  ?Diagnosis Date  ? Anxiety   ? Arthritis   ? hands  ? Bipolar 1 disorder ([place])   ? Breast cancer ([place]) 03/30/2013  ? right breast  ? Breast cancer ([place]) 07/02/2021  ? Left Breast  ? Contact lens/glasses fitting   ? wears contacts or glasses  ? Depression   ? Diabetes mellitus without complication ([place])   ? type 2  ? History of radiation therapy 110/10/14-12/26/14  ? right breast, 60.4Gy  ? Personal history of radiation therapy 2014  ? right breast  ? ?Past Surgical History:  ?Procedure Laterality Date  ? ABDOMINAL HYSTERECTOMY  1996  ? BREAST BIOPSY Right 03/30/2013  ? Ductal carcinoma in situ/calcifications,loq  ? BREAST LUMPECTOMY Right 05/02/2013  ? BREAST LUMPECTOMY WITH NEEDLE LOCALIZATION AND AXILLARY SENTINEL LYMPH NODE BX Right 05/02/2013  ? Procedure: BREAST LUMPECTOMY WITH NEEDLE LOCALIZATION AND AXILLARY SENTINEL LYMPH NODE BX;  Surgeon: Joyice Faster. Cornett, MD;  Location: [facility];  Service: General;  Laterality: Right;  ? BREAST LUMPECTOMY WITH RADIOACTIVE SEED AND SENTINEL LYMPH NODE BIOPSY Left 08/28/2021  ? Procedure: LEFT BREAST LUMPECTOMY WITH RADIOACTIVE SEED AND SENTINEL LYMPH NODE BIOPSY;  Surgeon: Erroll Luna, MD;  Location: [facility];  Service: General;  Laterality: Left;  ? FOOT SURGERY  2002  ? lt hammer toe-reconstruction  ? HARDWARE REMOVAL  2002  ? lt  foot hardware out, pt thinks she still has a pin in her left ankle  ? WISDOM TOOTH EXTRACTION     ? ?Patient Active Problem List  ? Diagnosis Date Noted  ? 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, estrogen receptor positive ([place]) 07/13/2021  ? Bipolar I disorder, most recent episode depressed ([place]) 12/25/2014  ? Overdose 12/23/2014  ? Hot flashes not due to menopause 03/14/2014  ? History of breast cancer 08/29/2013  ? Breast cancer of lower-outer quadrant of right female breast ([place]) 04/05/2013  ? ? ?REFERRING DIAG: left breast cancer at risk for lymphedema ? ?THERAPY DIAG:  ?Aftercare following surgery for neoplasm ? ?PERTINENT HISTORY:  Left lumpectomy (x2 at same time) on 08/28/2021 showed invasive lobular carcinoma and grade 1 DCIS in the upper outer quadrant, LCIS in the inferior quadrant, and axillary lymph nodes negative for carcinoma. Rt breast cancer with lumpectomy and SLNB ? ?PRECAUTIONS: bilateral UE Lymphedema risk, None ? ?SUBJECTIVE: Pt here for baseline measurement for SOZO screen.  ? ?PAIN:  ?Are you having pain? No ? ?SOZO SCREENING: ?Patient was assessed today using the SOZO machine to determine the baseline lymphedema index score. She will continue SOZO screenings. These are done every 3 months for 2 years post operatively followed by every 6 months for 2 years, and then annually. ? ? ? ? ? ?Otelia Limes, PTA ?10/20/2021, 4:32 PM ? ?  ? ?

## 2021-10-21 ENCOUNTER — Ambulatory Visit
Admission: RE | Admit: 2021-10-21 | Discharge: 2021-10-21 | Disposition: A | Discharge status: Home / Self Care | Payer: No Typology Code available for payment source | Source: Ambulatory Visit | Attending: Radiation Oncology | Admitting: Radiation Oncology

## 2021-10-21 DIAGNOSIS — Z510 Encounter for antineoplastic radiation therapy: Secondary | ICD-10-CM | POA: Diagnosis not present

## 2021-10-22 ENCOUNTER — Other Ambulatory Visit: Payer: Self-pay

## 2021-10-22 ENCOUNTER — Ambulatory Visit
Admission: RE | Admit: 2021-10-22 | Discharge: 2021-10-22 | Disposition: A | Discharge status: Home / Self Care | Payer: No Typology Code available for payment source | Source: Ambulatory Visit | Attending: Radiation Oncology | Admitting: Radiation Oncology

## 2021-10-22 DIAGNOSIS — Z510 Encounter for antineoplastic radiation therapy: Secondary | ICD-10-CM | POA: Diagnosis not present

## 2021-10-22 NOTE — Progress Notes (Signed)
? ?Patient Care Team: ?Kathalene Frames, MD as PCP - General (Internal Medicine) ?Rockwell Germany, RN as Oncology Nurse Navigator ?Mauro Kaufmann, RN as Oncology Nurse Navigator ?Nicholas Lose, MD as Consulting Physician (Hematology and Oncology) ? ?DIAGNOSIS:  ?Encounter Diagnosis  ?Name Primary?  ? Malignant neoplasm of lower-outer quadrant of right breast of female, estrogen receptor positive ([place])   ? ? ?SUMMARY OF ONCOLOGIC HISTORY: ?Oncology History  ?Breast cancer of lower-outer quadrant of right female breast [facility])  ?05/02/2013 Surgery  ? Right breast lumpectomy: Tumor size 0.35 cm, intermediate grade, 3 sentinel nodes negative, ER/PR HER-2 negative, T1 N0 M0 stage IA ?  ?06/06/2013 - 07/21/2013 Radiation Therapy  ? Adjuvant radiation therapy ?  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 Left Breast Palpable lesion: 3.2 cm cyst, 2 adj masses 0.6 cm, and 0.7 cm ?Biopsy 07/02/21: ILC Grade 1, ER: 90%, PR 100%, Her 2: Neg, Ki 67: 2% ?  ? ? ?CHIEF COMPLIANT: Follow-up right breast cancer. on radiation ? ?INTERVAL HISTORY: Caitlin Mcneil is a 66 y.o. with above-mentioned history of right breast cancer. She presents to the clinic for a follow-up. She states that radiation is going well.  Hot flashes have improved with Effexor.  She has read about antiestrogen therapy and is very concerned about adverse effects. ? ? ?ALLERGIES:  is allergic to carbamazepine, penicillins, aspirin, ciprofloxacin, and ibuprofen. ? ?MEDICATIONS:  ?Current Outpatient Medications  ?Medication Sig Dispense Refill  ? letrozole (FEMARA) 2.5 MG tablet Take 1 tablet (2.5 mg total) by mouth daily. 90 tablet 4  ? acetaminophen (TYLENOL) 650 MG CR tablet Take 1,300 mg by mouth daily.    ? Apoaequorin (PREVAGEN) 10 MG CAPS Take 10 mg by mouth daily.    ? B Complex Vitamins (VITAMIN B COMPLEX) TABS Take 1 tablet by mouth every evening.    ?  BD PEN NEEDLE NANO 2ND GEN 32G X 4 MM MISC     ? carboxymethylcellulose (REFRESH PLUS) 0.5 % SOLN Place 1 drop into both eyes daily.    ? cetirizine (ZYRTEC) 10 MG tablet Take 10 mg by mouth daily.    ? fluticasone (FLONASE) 50 MCG/ACT nasal spray Place 2 sprays into both nostrils daily.    ? Guaifenesin 1200 MG TB12 Take 1,200 mg by mouth daily.    ? hydrOXYzine (ATARAX) 25 MG tablet Take 50 mg by mouth at bedtime.    ? Insulin Pen Needle (BD PEN NEEDLE NANO U/F) 32G X 4 MM MISC See admin instructions.    ? melatonin 1 MG TABS tablet Take 2 mg by mouth at bedtime. (Patient not taking: Reported on 10/02/2021)    ? Multiple Vitamins-Minerals (MULTIVITAMIN ADULT EXTRA C PO) Take 1 tablet by mouth daily.    ? Omega-3 Fatty Acids (FISH OIL PO) Take 1 capsule by mouth every evening.    ? pregabalin (LYRICA) 100 MG capsule Take 100 mg by mouth 2 (two) times daily.    ? Probiotic Product (PROBIOTIC PO) Take 1 capsule by mouth daily.    ? QUEtiapine (SEROQUEL) 100 MG tablet Take 1 tablet (100 mg total) by mouth at bedtime. 30 tablet 0  ? rosuvastatin (CRESTOR) 20 MG tablet Take 20 mg by mouth at bedtime. (Patient not taking: Reported on 10/02/2021)    ? temazepam (RESTORIL) 15 MG capsule Take 1 capsule (15 mg total) by mouth at bedtime as needed for sleep. (  Patient taking differently: Take 15 mg by mouth at bedtime.) 30 capsule 0  ? TRULICITY 5.40 GQ/6.7YP SOPN Inject into the skin. (Patient not taking: Reported on 10/02/2021)    ? venlafaxine XR (EFFEXOR-XR) 37.5 MG 24 hr capsule Take 1 capsule (37.5 mg total) by mouth daily with breakfast. 90 capsule 3  ? ?No current facility-administered medications for this visit.  ? ? ?PHYSICAL EXAMINATION: ?ECOG PERFORMANCE STATUS: 1 - Symptomatic but completely ambulatory ? ?Vitals:  ? 10/23/21 1425  ?BP: (!) 171/108  ?Pulse: 76  ?Resp: 18  ?Temp: 97.8 ?F (36.6 ?C)  ?SpO2: 99%  ? ?Filed Weights  ? 10/23/21 1425  ?Weight: 195 lb 6.4 oz (88.6 kg)  ? ?  ? ?LABORATORY DATA:  ?I have reviewed  the data as listed ? ?  Latest Ref Rng & Units 08/25/2021  ? 11:49 AM 12/24/2014  ?  3:45 AM 12/23/2014  ?  1:40 PM  ?CMP  ?Glucose 70 - 99 mg/dL 122   82   104    ?BUN 8 - 23 mg/dL $Remove'21   19   21    'OicmmZX$ ?Creatinine 0.44 - 1.00 mg/dL 0.73   0.68   0.67    ?Sodium 135 - 145 mmol/L 140   143   140    ?Potassium 3.5 - 5.1 mmol/L 4.3   4.3   4.2    ?Chloride 98 - 111 mmol/L 105   109   106    ?CO2 22 - 32 mmol/L $RemoveB'30   26   28    'HCRnPfzc$ ?Calcium 8.9 - 10.3 mg/dL 9.3   8.7   8.9    ?Total Protein 6.5 - 8.1 g/dL   6.7    ?Total Bilirubin 0.3 - 1.2 mg/dL   0.6    ?Alkaline Phos 38 - 126 U/L   44    ?AST 15 - 41 U/L   15    ?ALT 14 - 54 U/L   12    ? ? ?Lab Results  ?Component Value Date  ? WBC 7.1 08/25/2021  ? HGB 13.4 08/25/2021  ? HCT 40.7 08/25/2021  ? MCV 90.6 08/25/2021  ? PLT 152 08/25/2021  ? NEUTROABS 6.3 09/18/2014  ? ? ?ASSESSMENT & PLAN:  ?Breast cancer of lower-outer quadrant of right female breast ([place]) ?07/02/2021: Left Breast Palpable lesion: 3.2 cm cyst, 2 adj masses 0.6 cm, and 0.7 cm ?Biopsy 07/02/21: ILC Grade 1, ER: 90%, PR 100%, Her 2: Neg, Ki 67: 2% ?  ?08/28/2021: Left lumpectomy: Grade 1 through 2 invasive lobular cancer presenting as few isolated tumor cells, grade 1 DCIS, LCIS, 0/3 lymph nodes negative (tumor size 4 mm) ER 90%, PR 100%, HER2 negative, Ki-67 2% ?  ?Treatment plan: ?1.  No role of Oncotype DX testing because of the small size ?2. adjuvant radiation 10/10/2021-11/06/2021 ?3.  Adjuvant antiestrogen therapy with letrozole to start 12/03/2021 ?  ?She has tapered off and discontinued estradiol patches. ?Severe hot flashes: I sent a prescription for Effexor 37.5 mg daily today.  She is doing significantly better with Effexor with regards to the hot flashes. ?  ? Letrozole counseling: We discussed the risks and benefits of anti-estrogen therapy with aromatase inhibitors. These include but not limited to insomnia, hot flashes, mood changes, vaginal dryness, bone density loss, and weight gain. We strongly believe  that the benefits far outweigh the risks. Patient understands these risks and consented to starting treatment. Planned treatment duration is 7 years. ? ?Return to clinic in  3 months for survivorship care plan visit ? ? ? ?No orders of the defined types were placed in this encounter. ? ?The patient has a good understanding of the overall plan. she agrees with it. she will call with any problems that may develop before the next visit here. ?Total time spent: 30 mins including face to face time and time spent for planning, charting and co-ordination of care ? ? Harriette Ohara, MD ?10/23/21 ? ? ? I Gardiner Coins am scribing for Dr. Lindi Adie ? ?I have reviewed the above documentation for accuracy and completeness, and I agree with the above. ?  ?

## 2021-10-23 ENCOUNTER — Ambulatory Visit
Admission: RE | Admit: 2021-10-23 | Discharge: 2021-10-23 | Disposition: A | Discharge status: Home / Self Care | Payer: No Typology Code available for payment source | Source: Ambulatory Visit | Attending: Radiation Oncology | Admitting: Radiation Oncology

## 2021-10-23 ENCOUNTER — Inpatient Hospital Stay
Payer: No Typology Code available for payment source | Attending: Hematology and Oncology | Admitting: Hematology and Oncology

## 2021-10-23 DIAGNOSIS — Z170 Estrogen receptor positive status [ER+]: Secondary | ICD-10-CM | POA: Insufficient documentation

## 2021-10-23 DIAGNOSIS — C50511 Malignant neoplasm of lower-outer quadrant of right female breast: Secondary | ICD-10-CM | POA: Diagnosis not present

## 2021-10-23 DIAGNOSIS — C50012 Malignant neoplasm of nipple and areola, left female breast: Secondary | ICD-10-CM | POA: Insufficient documentation

## 2021-10-23 DIAGNOSIS — Z510 Encounter for antineoplastic radiation therapy: Secondary | ICD-10-CM | POA: Insufficient documentation

## 2021-10-23 MED ORDER — LETROZOLE 2.5 MG PO TABS: 2.5000 mg | ORAL_TABLET | Freq: Every day | ORAL | 4 refills | Status: DC

## 2021-10-23 NOTE — Assessment & Plan Note (Signed)
07/02/2021:?Left Breast Palpable lesion: 3.2 cm cyst, 2 adj masses 0.6 cm, and 0.7 cm ?Biopsy 07/02/21: ILC Grade 1, ER: 90%, PR 100%, Her 2: Neg, Ki 67: 2% ?? ?08/28/2021: Left lumpectomy: Grade 1 through 2 invasive lobular cancer presenting as few isolated tumor cells, grade 1 DCIS, LCIS, 0/3 lymph nodes negative (tumor size 4 mm) ER 90%, PR 100%, HER2 negative, Ki-67 2% ?? ?Treatment plan: ?1.  No role of Oncotype DX testing because of the small size ?2. adjuvant radiation 10/10/2021-11/06/2021 ?3.  Adjuvant antiestrogen therapy ?? ?She has tapered off and discontinued estradiol patches. ?Severe hot flashes: I sent a prescription for Effexor 37.5 mg daily today.  Counseled her about risks and benefits. ?  ??Letrozole counseling: We discussed the risks and benefits of anti-estrogen therapy with aromatase inhibitors. These include but not limited to insomnia, hot flashes, mood changes, vaginal dryness, bone density loss, and weight gain. We strongly believe that the benefits far outweigh the risks. Patient understands these risks and consented to starting treatment. Planned treatment duration is 7 years. ? ?Return to clinic in 3 months for survivorship care plan visit ?

## 2021-10-24 ENCOUNTER — Other Ambulatory Visit: Payer: Self-pay

## 2021-10-24 ENCOUNTER — Ambulatory Visit
Admission: RE | Admit: 2021-10-24 | Discharge: 2021-10-24 | Disposition: A | Discharge status: Home / Self Care | Payer: No Typology Code available for payment source | Source: Ambulatory Visit | Attending: Radiation Oncology | Admitting: Radiation Oncology

## 2021-10-24 ENCOUNTER — Ambulatory Visit: Payer: No Typology Code available for payment source | Admitting: Radiation Oncology

## 2021-10-24 DIAGNOSIS — Z510 Encounter for antineoplastic radiation therapy: Secondary | ICD-10-CM | POA: Diagnosis not present

## 2021-10-27 ENCOUNTER — Other Ambulatory Visit: Payer: Self-pay

## 2021-10-27 ENCOUNTER — Ambulatory Visit
Admission: RE | Admit: 2021-10-27 | Discharge: 2021-10-27 | Disposition: A | Discharge status: Home / Self Care | Payer: No Typology Code available for payment source | Source: Ambulatory Visit | Attending: Radiation Oncology | Admitting: Radiation Oncology

## 2021-10-27 DIAGNOSIS — Z170 Estrogen receptor positive status [ER+]: Secondary | ICD-10-CM | POA: Diagnosis present

## 2021-10-27 DIAGNOSIS — Z510 Encounter for antineoplastic radiation therapy: Secondary | ICD-10-CM | POA: Insufficient documentation

## 2021-10-27 DIAGNOSIS — C50012 Malignant neoplasm of nipple and areola, left female breast: Secondary | ICD-10-CM | POA: Diagnosis present

## 2021-10-28 ENCOUNTER — Ambulatory Visit
Admission: RE | Admit: 2021-10-28 | Discharge: 2021-10-28 | Disposition: A | Discharge status: Home / Self Care | Payer: No Typology Code available for payment source | Source: Ambulatory Visit | Attending: Radiation Oncology | Admitting: Radiation Oncology

## 2021-10-28 DIAGNOSIS — Z510 Encounter for antineoplastic radiation therapy: Secondary | ICD-10-CM | POA: Diagnosis not present

## 2021-10-29 ENCOUNTER — Ambulatory Visit
Admission: RE | Admit: 2021-10-29 | Discharge: 2021-10-29 | Disposition: A | Discharge status: Home / Self Care | Payer: No Typology Code available for payment source | Source: Ambulatory Visit | Attending: Radiation Oncology | Admitting: Radiation Oncology

## 2021-10-29 ENCOUNTER — Other Ambulatory Visit: Payer: Self-pay

## 2021-10-29 DIAGNOSIS — Z510 Encounter for antineoplastic radiation therapy: Secondary | ICD-10-CM | POA: Diagnosis not present

## 2021-10-30 ENCOUNTER — Ambulatory Visit
Admission: RE | Admit: 2021-10-30 | Discharge: 2021-10-30 | Disposition: A | Discharge status: Home / Self Care | Payer: No Typology Code available for payment source | Source: Ambulatory Visit | Attending: Radiation Oncology | Admitting: Radiation Oncology

## 2021-10-30 DIAGNOSIS — Z510 Encounter for antineoplastic radiation therapy: Secondary | ICD-10-CM | POA: Diagnosis not present

## 2021-10-31 ENCOUNTER — Other Ambulatory Visit: Payer: Self-pay

## 2021-10-31 ENCOUNTER — Ambulatory Visit: Payer: No Typology Code available for payment source

## 2021-10-31 ENCOUNTER — Ambulatory Visit
Admission: RE | Admit: 2021-10-31 | Discharge: 2021-10-31 | Disposition: A | Discharge status: Home / Self Care | Payer: No Typology Code available for payment source | Source: Ambulatory Visit | Attending: Radiation Oncology | Admitting: Radiation Oncology

## 2021-10-31 DIAGNOSIS — Z510 Encounter for antineoplastic radiation therapy: Secondary | ICD-10-CM | POA: Diagnosis not present

## 2021-11-03 ENCOUNTER — Ambulatory Visit: Payer: No Typology Code available for payment source

## 2021-11-03 ENCOUNTER — Ambulatory Visit
Admission: RE | Admit: 2021-11-03 | Discharge: 2021-11-03 | Disposition: A | Discharge status: Home / Self Care | Payer: No Typology Code available for payment source | Source: Ambulatory Visit | Attending: Radiation Oncology | Admitting: Radiation Oncology

## 2021-11-03 ENCOUNTER — Other Ambulatory Visit: Payer: Self-pay

## 2021-11-03 DIAGNOSIS — Z510 Encounter for antineoplastic radiation therapy: Secondary | ICD-10-CM | POA: Diagnosis not present

## 2021-11-04 ENCOUNTER — Ambulatory Visit
Admission: RE | Admit: 2021-11-04 | Discharge: 2021-11-04 | Disposition: A | Discharge status: Home / Self Care | Payer: No Typology Code available for payment source | Source: Ambulatory Visit | Attending: Radiation Oncology | Admitting: Radiation Oncology

## 2021-11-04 ENCOUNTER — Encounter: Payer: Self-pay | Admitting: *Deleted

## 2021-11-04 ENCOUNTER — Ambulatory Visit: Payer: No Typology Code available for payment source

## 2021-11-04 DIAGNOSIS — Z510 Encounter for antineoplastic radiation therapy: Secondary | ICD-10-CM | POA: Diagnosis not present

## 2021-11-04 DIAGNOSIS — Z170 Estrogen receptor positive status [ER+]: Secondary | ICD-10-CM

## 2021-11-05 ENCOUNTER — Ambulatory Visit
Admission: RE | Admit: 2021-11-05 | Discharge: 2021-11-05 | Disposition: A | Discharge status: Home / Self Care | Payer: No Typology Code available for payment source | Source: Ambulatory Visit | Attending: Radiation Oncology | Admitting: Radiation Oncology

## 2021-11-05 ENCOUNTER — Ambulatory Visit: Payer: No Typology Code available for payment source

## 2021-11-05 ENCOUNTER — Other Ambulatory Visit: Payer: Self-pay

## 2021-11-05 DIAGNOSIS — Z510 Encounter for antineoplastic radiation therapy: Secondary | ICD-10-CM | POA: Diagnosis not present

## 2021-11-06 ENCOUNTER — Encounter: Payer: Self-pay | Admitting: Radiation Oncology

## 2021-11-06 ENCOUNTER — Ambulatory Visit
Admission: RE | Admit: 2021-11-06 | Discharge: 2021-11-06 | Disposition: A | Discharge status: Home / Self Care | Payer: No Typology Code available for payment source | Source: Ambulatory Visit | Attending: Radiation Oncology | Admitting: Radiation Oncology

## 2021-11-06 DIAGNOSIS — Z510 Encounter for antineoplastic radiation therapy: Secondary | ICD-10-CM | POA: Diagnosis not present

## 2021-12-01 NOTE — Progress Notes (Signed)
? ?                                                                                                                                                          ?  Patient Name: Caitlin Mcneil ?MRN: [id number] ?DOB: 1956/05/15 ?Referring Physician: Okey Dupre A ?Date of Service: 11/06/2021 ?[facility], [place] ? ?                                                      End Of Treatment Note ? ?Diagnoses: C50.412-Malignant neoplasm of upper-outer quadrant of left female breast ? ?Cancer Staging: Stage IA, pT1aN0M0 grade 1-2 ER/PR positive invasive lobular carcinoma of the left breast. ? ?Intent: Curative ? ?Radiation Treatment Dates: 10/09/2021 through 11/06/2021 ?Site Technique Total Dose (Gy) Dose per Fx (Gy) Completed Fx Beam Energies  ?Breast, Left: Breast_L 3D 42.56/42.56 2.66 16/16 10X  ?Breast, Left: Breast_L_Bst 3D 8/8 2 4/4 10X  ? ?Narrative: The patient tolerated radiation therapy relatively well. She developed fatigue and anticipated skin changes in the treatment field.  ? ?Plan: The patient will receive a call in about one month from the radiation oncology department. She will continue follow up with Dr. Lindi Adie as well for this cancer and her history of right breast cancer.  ? ?________________________________________________ ? ? ? ?Carola Rhine, PAC  ?

## 2021-12-23 ENCOUNTER — Ambulatory Visit
Admission: RE | Admit: 2021-12-23 | Discharge: 2021-12-23 | Disposition: A | Discharge status: Home / Self Care | Payer: No Typology Code available for payment source | Source: Ambulatory Visit | Attending: Radiation Oncology | Admitting: Radiation Oncology

## 2021-12-23 DIAGNOSIS — C50012 Malignant neoplasm of nipple and areola, left female breast: Secondary | ICD-10-CM

## 2021-12-23 NOTE — Progress Notes (Signed)
  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  Intent: Curative  Radiation Treatment Dates: 10/09/2021 through 11/06/2021 Site Technique Total Dose (Gy) Dose per Fx (Gy) Completed Fx Beam Energies  Breast, Left: Breast_L 3D 42.56/42.56 2.66 16/16 10X  Breast, Left: Breast_L_Bst 3D 8/8 2 4/4 10X   Narrative: The patient tolerated radiation therapy relatively well. She developed fatigue and anticipated skin changes in the treatment field.    Impression/Plan: 1. 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  I was unable to reach the patient but left a voicemail and on the message, I discussed that we would be happy to continue to follow her as needed, but she will also continue to follow up with Dr. Lindi Adie in medical oncology. She was counseled to call if she had questions about skin care or measures to avoid sun exposure to this area.        Carola Rhine, PAC

## 2022-01-16 ENCOUNTER — Telehealth: Payer: Self-pay | Admitting: *Deleted

## 2022-01-23 ENCOUNTER — Other Ambulatory Visit: Payer: Self-pay

## 2022-01-23 ENCOUNTER — Inpatient Hospital Stay: Payer: No Typology Code available for payment source | Attending: Adult Health | Admitting: Adult Health

## 2022-01-23 VITALS — BP 124/65 | HR 86 | Temp 98.2°F | Resp 18 | Ht 63.0 in | Wt 196.6 lb

## 2022-01-23 DIAGNOSIS — Z170 Estrogen receptor positive status [ER+]: Secondary | ICD-10-CM | POA: Insufficient documentation

## 2022-01-23 DIAGNOSIS — Z79811 Long term (current) use of aromatase inhibitors: Secondary | ICD-10-CM | POA: Insufficient documentation

## 2022-01-23 DIAGNOSIS — Z923 Personal history of irradiation: Secondary | ICD-10-CM | POA: Diagnosis not present

## 2022-01-23 DIAGNOSIS — Z800 Family history of malignant neoplasm of digestive organs: Secondary | ICD-10-CM | POA: Insufficient documentation

## 2022-01-23 DIAGNOSIS — Z803 Family history of malignant neoplasm of breast: Secondary | ICD-10-CM | POA: Insufficient documentation

## 2022-01-23 DIAGNOSIS — C50012 Malignant neoplasm of nipple and areola, left female breast: Secondary | ICD-10-CM | POA: Diagnosis present

## 2022-01-23 DIAGNOSIS — C50511 Malignant neoplasm of lower-outer quadrant of right female breast: Secondary | ICD-10-CM

## 2022-01-23 MED ORDER — VENLAFAXINE HCL ER 75 MG PO CP24: 75.0000 mg | ORAL_CAPSULE | Freq: Every day | ORAL | 3 refills | Status: DC

## 2022-01-26 ENCOUNTER — Telehealth: Payer: Self-pay | Admitting: Adult Health

## 2022-01-26 ENCOUNTER — Ambulatory Visit: Payer: No Typology Code available for payment source | Attending: Radiation Oncology

## 2022-01-26 VITALS — Wt 192.5 lb

## 2022-01-26 DIAGNOSIS — Z483 Aftercare following surgery for neoplasm: Secondary | ICD-10-CM | POA: Insufficient documentation

## 2022-01-26 NOTE — Telephone Encounter (Signed)
Scheduled appointment per 6/30 los. Patient is aware.

## 2022-01-26 NOTE — Therapy (Signed)
  OUTPATIENT PHYSICAL THERAPY SOZO SCREENING NOTE   Patient Name: Caitlin Mcneil MRN: [id number] DOB:1956-07-06, 66 y.o., female Today's Date: 01/26/2022  PCP: Kathalene Frames, MD REFERRING PROVIDER: Hayden Pedro,*   PT End of Session - 01/26/22 1630     Visit Number 1   # unchanged due to screen only   PT Start Time 1627    PT Stop Time 3662    PT Time Calculation (min) 6 min    Activity Tolerance Patient tolerated treatment well    Behavior During Therapy [facility] for tasks assessed/performed             Past Medical History:  Diagnosis Date   Anxiety    Arthritis    hands   Bipolar 1 disorder ([place])    Breast cancer ([place]) 03/30/2013   right breast   Breast cancer ([place]) 07/02/2021   Left Breast   Contact lens/glasses fitting    wears contacts or glasses   Depression    Diabetes mellitus without complication ([place])    type 2   History of radiation therapy 110/10/14-12/26/14   right breast, 60.4Gy   Personal history of radiation therapy 2014   right breast   Past Surgical History:  Procedure Laterality Date   ABDOMINAL HYSTERECTOMY  1996   BREAST BIOPSY Right 03/30/2013   Ductal carcinoma in situ/calcifications,loq   BREAST LUMPECTOMY Right 05/02/2013   BREAST LUMPECTOMY WITH NEEDLE LOCALIZATION AND AXILLARY SENTINEL LYMPH NODE BX Right 05/02/2013   Procedure: BREAST LUMPECTOMY WITH NEEDLE LOCALIZATION AND AXILLARY SENTINEL LYMPH NODE BX;  Surgeon: Joyice Faster. 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, estrogen receptor positive ([place]) 07/13/2021   Bipolar I disorder, most recent episode depressed ([place]) 12/25/2014   Overdose 12/23/2014   Hot flashes not due to menopause 03/14/2014   History of breast cancer 08/29/2013   Breast cancer of lower-outer quadrant of right female breast ([place]) 04/05/2013    REFERRING DIAG: left breast cancer at risk for lymphedema  THERAPY DIAG: Aftercare following surgery for neoplasm  PERTINENT HISTORY:  Left lumpectomy (x2 at same time) on 08/28/2021 showed invasive lobular carcinoma and grade 1 DCIS in the upper outer quadrant, LCIS in the inferior quadrant, and axillary lymph nodes negative for carcinoma. Rt breast cancer with lumpectomy and SLNB  PRECAUTIONS: bilateral UE Lymphedema risk, (only screening Lt) None  SUBJECTIVE: Pt here for baseline measurement for SOZO screen.   PAIN:  Are you having pain? No  SOZO SCREENING: Patient was assessed today using the SOZO machine to determine the baseline lymphedema index score. She will continue SOZO screenings. These are done every 3 months for 2 years post operatively followed by every 6 months for 2 years, and then annually.      Otelia Limes, PTA 01/26/2022, 4:40 PM

## 2022-01-28 ENCOUNTER — Encounter: Payer: Self-pay | Admitting: Adult Health

## 2022-01-28 NOTE — Progress Notes (Signed)
SURVIVORSHIP VISIT:   BRIEF ONCOLOGIC HISTORY:  Oncology History  Breast cancer of lower-outer quadrant of right female breast ([place])  05/02/2013 Surgery   Right breast lumpectomy: Tumor size 0.35 cm, intermediate grade, 3 sentinel nodes negative, ER/PR HER-2 negative, T1 N0 M0 stage IA   06/06/2013 - 07/21/2013 Radiation Therapy   Adjuvant radiation therapy   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, estrogen receptor positive ([place])  07/02/2021 Initial Diagnosis   Left Breast Palpable lesion: 3.2 cm cyst, 2 adj masses 0.6 cm, and 0.7 cm Biopsy 07/02/21: ILC Grade 1, ER: 90%, PR 100%, Her 2: Neg, Ki 67: 2%   10/09/2021 - 11/06/2021 Radiation Therapy   Site Technique Total Dose (Gy) Dose per Fx (Gy) Completed Fx Beam Energies  Breast, Left: Breast_L 3D 42.56/42.56 2.66 16/16 10X  Breast, Left: Breast_L_Bst 3D 8/8 2 4/4 10X      Surgery   A. BREAST, LEFT UPPER OUTER QUADRANT, LUMPECTOMY:  -  Invasive lobular carcinoma, presenting as few isolated tumor cells,  focally within 1 mm of the medial margin (slide A2).  -  Ductal carcinoma in situ, grade I (low), solid pattern, 5 mm focus, 1  mm from inferior margin.  -  Lobular neoplasia (/LCIS).  -  Prior biopsy site changes.  -  Microcalcifications present.   B. BREAST, LEFT INFERIOR, LUMPECTOMY:  -  No invasive carcinoma identified.  -  Lobular neoplasia (LCIS).  -  Prior biopsy stie changes.  -  Microcalcifications present.   C. LYMPH NODE, LEFT AXILLARY, SENTINEL, EXCISION:  -  Lymph node negative for malignancy (0/1).   D. LYMPH NODE, LEFT AXILLARY, SENTINEL, EXCISION:  -  Lymph node negative for malignancy (0/1).   E. LYMPH NODE, LEFT AXILLARY, SENTINEL, EXCISION:  -  Lymph node negative for malignancy (0/1).    12/03/2021 -  Anti-estrogen oral therapy   Letrozole daily x 7 years     INTERVAL HISTORY:  Ms. Grzesiak to review her survivorship care plan detailing her treatment course for  breast cancer, as well as monitoring long-term side effects of that treatment, education regarding health maintenance, screening, and overall wellness and health promotion.     Overall, Ms. Bober reports feeling moderately well.  She is taking Letrozole daily and is tolerating it well.    REVIEW OF SYSTEMS:  Review of Systems  Constitutional:  Negative for appetite change, chills, fatigue, fever and unexpected weight change.  HENT:   Negative for hearing loss, lump/mass and trouble swallowing.   Eyes:  Negative for eye problems and icterus.  Respiratory:  Negative for chest tightness, cough and shortness of breath.   Cardiovascular:  Negative for chest pain, leg swelling and palpitations.  Gastrointestinal:  Negative for abdominal distention, abdominal pain, constipation, diarrhea, nausea and vomiting.  Endocrine: Negative for hot flashes.  Genitourinary:  Negative for difficulty urinating.   Musculoskeletal:  Negative for arthralgias.  Skin:  Negative for itching and rash.  Neurological:  Negative for dizziness, extremity weakness, headaches and numbness.  Hematological:  Negative for adenopathy. Does not bruise/bleed easily.  Psychiatric/Behavioral:  Negative for depression. The patient is not nervous/anxious.    Breast: Denies any new nodularity, masses, tenderness, nipple changes, or nipple discharge.      ONCOLOGY TREATMENT TEAM:  1. Surgeon:  Dr. Brantley Stage at [facility] 2. Medical Oncologist: Dr. Lindi Adie  3. Radiation Oncologist: Dr. Lisbeth Renshaw    PAST MEDICAL/SURGICAL HISTORY:  Past Medical History:  Diagnosis Date  Anxiety    Arthritis    hands   Bipolar 1 disorder ([place])    Breast cancer ([place]) 03/30/2013   right breast   Breast cancer ([place]) 07/02/2021   Left Breast   Contact lens/glasses fitting    wears contacts or glasses   Depression    Diabetes mellitus without complication ([place])    type 2   History of radiation therapy 110/10/14-12/26/14   right  breast, 60.4Gy   Personal history of radiation therapy 2014   right breast   Past Surgical History:  Procedure Laterality Date   ABDOMINAL HYSTERECTOMY  1996   BREAST BIOPSY Right 03/30/2013   Ductal carcinoma in situ/calcifications,loq   BREAST LUMPECTOMY Right 05/02/2013   BREAST LUMPECTOMY WITH NEEDLE LOCALIZATION AND AXILLARY SENTINEL LYMPH NODE BX Right 05/02/2013   Procedure: BREAST LUMPECTOMY WITH NEEDLE LOCALIZATION AND AXILLARY SENTINEL LYMPH NODE BX;  Surgeon: Joyice Faster. Cornett, MD;  Location: [facility];  Service: General;  Laterality: Right;   BREAST LUMPECTOMY WITH RADIOACTIVE SEED AND SENTINEL LYMPH NODE BIOPSY Left 08/28/2021   Procedure: LEFT BREAST LUMPECTOMY WITH RADIOACTIVE SEED AND SENTINEL LYMPH NODE BIOPSY;  Surgeon: Erroll Luna, MD;  Location: [facility];  Service: General;  Laterality: Left;   FOOT SURGERY  2002   lt hammer toe-reconstruction   HARDWARE REMOVAL  2002   lt  foot hardware out, pt thinks she still has a pin in her left ankle   WISDOM TOOTH EXTRACTION       ALLERGIES:  Allergies  Allergen Reactions   Carbamazepine Anaphylaxis    Other reaction(s): decreased WBC, Unknown   Penicillins Anaphylaxis   Aspirin Rash   Ciprofloxacin Rash   Ibuprofen Rash     CURRENT MEDICATIONS:  Outpatient Encounter Medications as of 01/23/2022  Medication Sig   acetaminophen (TYLENOL) 650 MG CR tablet Take 1,300 mg by mouth daily.   Apoaequorin (PREVAGEN) 10 MG CAPS Take 10 mg by mouth daily.   B Complex Vitamins (VITAMIN B COMPLEX) TABS Take 1 tablet by mouth every evening.   BD PEN NEEDLE NANO 2ND GEN 32G X 4 MM MISC    carboxymethylcellulose (REFRESH PLUS) 0.5 % SOLN Place 1 drop into both eyes daily.   cetirizine (ZYRTEC) 10 MG tablet Take 10 mg by mouth daily.   Dulaglutide 1.5 MG/0.5ML SOPN Inject 1.5 mg into the skin once a week.  Pt takes medication on  Saturday.   Guaifenesin 1200 MG TB12 Take 1,200 mg by mouth daily.   hydrOXYzine  (ATARAX) 25 MG tablet Take 50 mg by mouth at bedtime.   Insulin Pen Needle (BD PEN NEEDLE NANO U/F) 32G X 4 MM MISC See admin instructions.   letrozole (FEMARA) 2.5 MG tablet Take 1 tablet (2.5 mg total) by mouth daily.   melatonin 1 MG TABS tablet Take 2 mg by mouth at bedtime.   Multiple Vitamins-Minerals (MULTIVITAMIN ADULT EXTRA C PO) Take 1 tablet by mouth daily.   Omega-3 Fatty Acids (FISH OIL PO) Take 1 capsule by mouth every evening.   pregabalin (LYRICA) 100 MG capsule Take 100 mg by mouth 2 (two) times daily.   Probiotic Product (PROBIOTIC PO) Take 1 capsule by mouth daily.   QUEtiapine (SEROQUEL) 100 MG tablet Take 1 tablet (100 mg total) by mouth at bedtime.   rosuvastatin (CRESTOR) 20 MG tablet Take 20 mg by mouth at bedtime.   temazepam (RESTORIL) 15 MG capsule Take 1 capsule (15 mg total) by mouth at bedtime as needed for sleep. (  Patient taking differently: Take 15 mg by mouth at bedtime.)   [DISCONTINUED] venlafaxine XR (EFFEXOR-XR) 37.5 MG 24 hr capsule Take 1 capsule (37.5 mg total) by mouth daily with breakfast.   fluticasone (FLONASE) 50 MCG/ACT nasal spray Place 2 sprays into both nostrils daily. (Patient not taking: Reported on 01/23/2022)   venlafaxine XR (EFFEXOR-XR) 75 MG 24 hr capsule Take 1 capsule (75 mg total) by mouth daily with breakfast.   No facility-administered encounter medications on file as of 01/23/2022.     ONCOLOGIC FAMILY HISTORY:  Family History  Problem Relation Age of Onset   Colon cancer Father 5   Alzheimer's disease Mother    Alzheimer's disease Maternal Grandmother    Cancer Maternal Grandfather        Cancer - NOS   Breast cancer Other        maternal great aunt     SOCIAL HISTORY:  Social History   Socioeconomic History   Marital status: Married    Spouse name: Not on file   Number of children: 1   Years of education: Not on file   Highest education level: Not on file  Occupational History    Employer: [employer]   Tobacco Use   Smoking status: Never   Smokeless tobacco: Never  Vaping Use   Vaping Use: Never used  Substance and Sexual Activity   Alcohol use: No   Drug use: No   Sexual activity: Not Currently  Other Topics Concern   Not on file  Social History Narrative   Not on file   Social Determinants of Health   Financial Resource Strain: Not on file  Food Insecurity: Not on file  Transportation Needs: Not on file  Physical Activity: Not on file  Stress: Not on file  Social Connections: Not on file  Intimate Partner Violence: Not At Risk (08/07/2021)   Humiliation, Afraid, Rape, and Kick questionnaire    Fear of Current or Ex-Partner: No    Emotionally Abused: No    Physically Abused: No    Sexually Abused: No     OBSERVATIONS/OBJECTIVE:  BP 124/65 (BP Location: Right Wrist, Patient Position: Sitting)   Pulse 86   Temp 98.2 F (36.8 C) (Temporal)   Resp 18   Ht _0  (1.6 m)   Wt 196 lb 9.6 oz (89.2 kg)   SpO2 100%   BMI 34.83 kg/m  GENERAL: Patient is a well appearing female in no acute distress HEENT:  Sclerae anicteric.  Oropharynx clear and moist. No ulcerations or evidence of oropharyngeal candidiasis. Neck is supple.  NODES:  No cervical, supraclavicular, or axillary lymphadenopathy palpated.  BREAST EXAM:  Deferred. LUNGS:  Clear to auscultation bilaterally.  No wheezes or rhonchi. HEART:  Regular rate and rhythm. No murmur appreciated. ABDOMEN:  Soft, nontender.  Positive, normoactive bowel sounds. No organomegaly palpated. MSK:  No focal spinal tenderness to palpation. Full range of motion bilaterally in the upper extremities. EXTREMITIES:  No peripheral edema.   SKIN:  Clear with no obvious rashes or skin changes. No nail dyscrasia. NEURO:  Nonfocal. Well oriented.  Appropriate affect.   LABORATORY DATA:  None for this visit.  DIAGNOSTIC IMAGING:  None for this visit.      ASSESSMENT AND PLAN:  Ms.. Grimme is a pleasant 66 y.o. female with Stage IB  left breast invasive lobular carcinoma, ER+/PR+/HER2-, diagnosed in 06/2021, treated with lumpectomy, adjuvant radiation therapy, and anti-estrogen therapy with Letrozole beginning in 11/2021.  She presents to the  [facility]vivorship Clinic for our initial meeting and routine follow-up post-completion of treatment for breast cancer.    1. Stage IB left breast cancer:  Ms. Cotham is continuing to recover from definitive treatment for breast cancer. She will follow-up with her medical oncologist, Dr. Lindi Adie in 6 months with history and physical exam per surveillance protocol.  She will continue her anti-estrogen therapy with Letrozole. Thus far, she is tolerating the Letrozole well, with minimal side effects. She was instructed to make Dr. Lindi Adie or myself aware if she begins to experience any worsening side effects of the medication and I could see her back in clinic to help manage those side effects, as needed. Her mammogram is due 05/2022; orders placed today.   Today, a comprehensive survivorship care plan and treatment summary was reviewed with the patient today detailing her breast cancer diagnosis, treatment course, potential late/long-term effects of treatment, appropriate follow-up care with recommendations for the future, and patient education resources.  A copy of this summary, along with a letter will be sent to the patient's primary care provider via mail/fax/In Basket message after today's visit.    2. Bone health:  Given Ms. Alicia age/history of breast cancer and her current treatment regimen including anti-estrogen therapy with Letrozole, she is at risk for bone demineralization.  She last undewent bone density testing in 2022 with her PCP.  I recommended repeat in 2024.  She was given education on specific activities to promote bone health.  3. Cancer screening:  Due to Ms. Coyne history and her age, she should receive screening for skin cancers, colon cancer, and gynecologic cancers.  The  information and recommendations are listed on the patient's comprehensive care plan/treatment summary and were reviewed in detail with the patient.    4. Health maintenance and wellness promotion: Ms. Rogoff was encouraged to consume 5-7 servings of fruits and vegetables per day. We reviewed the "Nutrition Rainbow" handout.  She was also encouraged to engage in moderate to vigorous exercise for 30 minutes per day most days of the week. We discussed the [employer] fitness program, which is designed for cancer survivors to help them become more physically fit after cancer treatments.  She was instructed to limit her alcohol consumption and continue to abstain from tobacco use.     5. Support services/counseling: It is not uncommon for this period of the patient's cancer care trajectory to be one of many emotions and stressors.  She was given information regarding our available services and encouraged to contact me with any questions or for help enrolling in any of our support group/programs.    Follow up instructions:    -Return to cancer center in 6 months for f/u with Dr. Lindi Adie  -Mammogram due in 05/2022 -Follow up with surgery 1 year -She is welcome to return back to the Survivorship Clinic at any time; no additional follow-up needed at this time.  -Consider referral back to survivorship as a long-term survivor for continued surveillance  The patient was provided an opportunity to ask questions and all were answered. The patient agreed with the plan and demonstrated an understanding of the instructions.   Total encounter time:40 minutes*in face-to-face visit time, chart review, lab review, care coordination, order entry, and documentation of the encounter time.    Wilber Bihari, NP 01/28/22 3:22 PM Medical Oncology and Hematology [facility] [place], [place] [address] Tel. [phone]    Fax. [phone]  *Total Encounter Time as defined by the  Centers for Medicare and Medicaid Services includes, in addition to the face-to-face time of a patient visit (documented in the note above) non-face-to-face time: obtaining and reviewing outside history, ordering and reviewing medications, tests or procedures, care coordination (communications with other health care professionals or caregivers) and documentation in the medical record.

## 2022-05-18 ENCOUNTER — Ambulatory Visit: Payer: No Typology Code available for payment source | Attending: Surgery

## 2022-05-18 VITALS — Wt 194.5 lb

## 2022-05-18 DIAGNOSIS — Z483 Aftercare following surgery for neoplasm: Secondary | ICD-10-CM | POA: Insufficient documentation

## 2022-05-18 NOTE — Therapy (Signed)
OUTPATIENT PHYSICAL THERAPY SOZO SCREENING NOTE   Patient Name: Caitlin Mcneil MRN: [id number] DOB:April 19, 1956, 66 y.o., female Today's Date: 05/18/2022  PCP: Kathalene Frames, MD REFERRING PROVIDER: Erroll Luna, MD   PT End of Session - 05/18/22 1012     Visit Number 1   # unchanged due to screen only   PT Start Time 1009    PT Stop Time 1014    PT Time Calculation (min) 5 min    Activity Tolerance Patient tolerated treatment well    Behavior During Therapy [facility] for tasks assessed/performed             Past Medical History:  Diagnosis Date   Anxiety    Arthritis    hands   Bipolar 1 disorder ([place])    Breast cancer ([place]) 03/30/2013   right breast   Breast cancer ([place]) 07/02/2021   Left Breast   Contact lens/glasses fitting    wears contacts or glasses   Depression    Diabetes mellitus without complication ([place])    type 2   History of radiation therapy 110/10/14-12/26/14   right breast, 60.4Gy   Personal history of radiation therapy 2014   right breast   Past Surgical History:  Procedure Laterality Date   ABDOMINAL HYSTERECTOMY  1996   BREAST BIOPSY Right 03/30/2013   Ductal carcinoma in situ/calcifications,loq   BREAST LUMPECTOMY Right 05/02/2013   BREAST LUMPECTOMY WITH NEEDLE LOCALIZATION AND AXILLARY SENTINEL LYMPH NODE BX Right 05/02/2013   Procedure: BREAST LUMPECTOMY WITH NEEDLE LOCALIZATION AND AXILLARY SENTINEL LYMPH NODE BX;  Surgeon: Joyice Faster. Cornett, MD;  Location: [facility];  Service: General;  Laterality: Right;   BREAST LUMPECTOMY WITH RADIOACTIVE SEED AND SENTINEL LYMPH NODE BIOPSY Left 08/28/2021   Procedure: LEFT BREAST LUMPECTOMY WITH RADIOACTIVE SEED AND SENTINEL LYMPH NODE BIOPSY;  Surgeon: Erroll Luna, MD;  Location: [facility];  Service: General;  Laterality: Left;   FOOT SURGERY  2002   lt hammer toe-reconstruction   HARDWARE REMOVAL  2002   lt  foot hardware out, pt thinks she still has a pin in her left ankle    WISDOM TOOTH EXTRACTION     Patient Active Problem List   Diagnosis Date Noted   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, estrogen receptor positive ([place]) 07/13/2021   Bipolar I disorder, most recent episode depressed ([place]) 12/25/2014   Overdose 12/23/2014   Hot flashes not due to menopause 03/14/2014   History of breast cancer 08/29/2013   Breast cancer of lower-outer quadrant of right female breast ([place]) 04/05/2013    REFERRING DIAG: left breast cancer at risk for lymphedema  THERAPY DIAG: Aftercare following surgery for neoplasm  PERTINENT HISTORY:  Left lumpectomy (x2 at same time) on 08/28/2021 showed invasive lobular carcinoma and grade 1 DCIS in the upper outer quadrant, LCIS in the inferior quadrant, and axillary lymph nodes negative for carcinoma. Rt breast cancer with lumpectomy and SLNB  PRECAUTIONS: bilateral UE Lymphedema risk, (only screening Lt) None  SUBJECTIVE: Pt here for baseline measurement for SOZO screen.   PAIN:  Are you having pain? No  SOZO SCREENING: Patient was assessed today using the SOZO machine to determine the baseline lymphedema index score. She will continue SOZO screenings. These are done every 3 months for 2 years post operatively followed by every 6 months for 2 years, and then annually.    L-DEX FLOWSHEETS - 05/18/22 1000       L-DEX LYMPHEDEMA  SCREENING   Measurement Type Unilateral    L-DEX MEASUREMENT EXTREMITY Upper Extremity    POSITION  Standing    DOMINANT SIDE Right    At Risk Side Left    BASELINE SCORE (UNILATERAL) -1.6    L-DEX SCORE (UNILATERAL) -4.7    VALUE CHANGE (UNILAT) -3.1               Otelia Limes, PTA 05/18/2022, 10:13 AM

## 2022-05-29 IMAGING — MG MM PLC BREAST LOC DEV 1ST LESION INC MAMMO GUIDE*L*
8 of 11 series · 8 of 15 positions shown · non-contrast
Comparison: Previous exam(s).

CLINICAL DATA: Patient presents for radioactive seed localization a
left breast invasive lobular carcinoma as well as 2 additional left
breast lesions, both with atypical lobular hyperplasia/lobular
neoplasia, 1 of which also demonstrated lobular carcinoma in-situ.

EXAM:
MAMMOGRAPHIC GUIDED RADIOACTIVE SEED LOCALIZATION OF THE LEFT
BREAST: 3 RADIOACTIVE SEEDS PLACED

[L LM (1 of 5)]
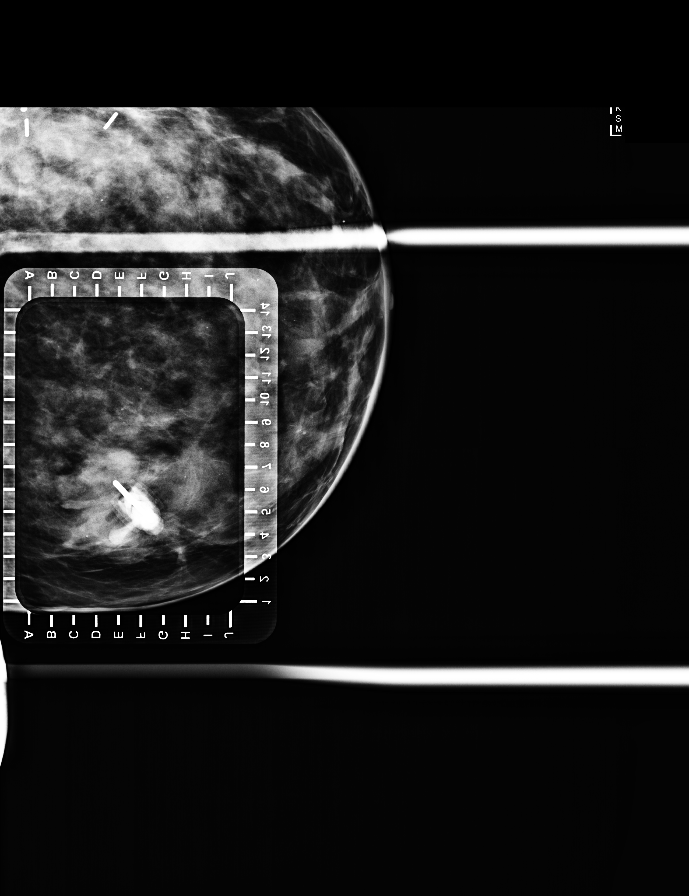

[L LM (2 of 5)]
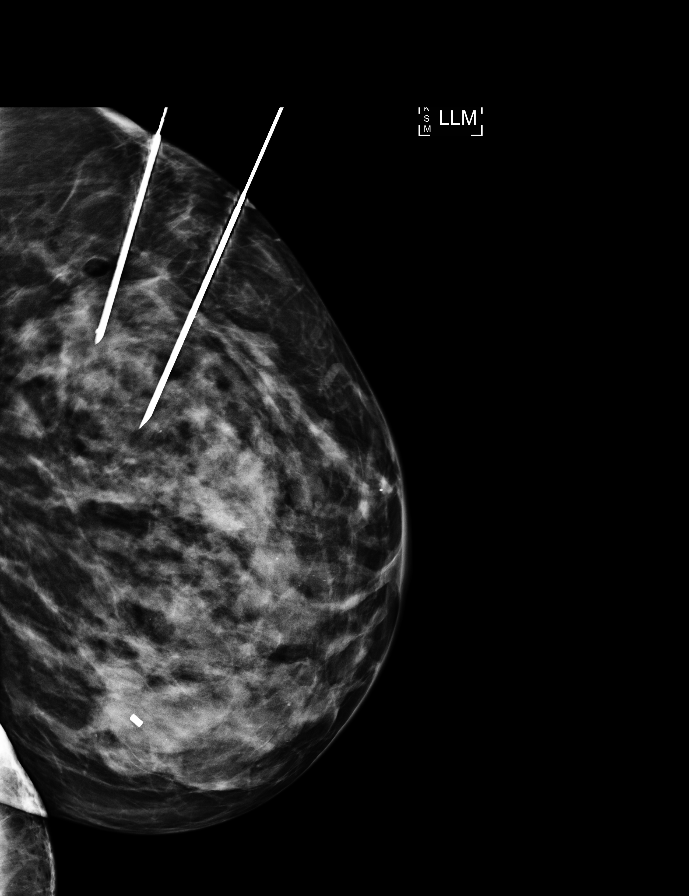

[L LM (3 of 5)]
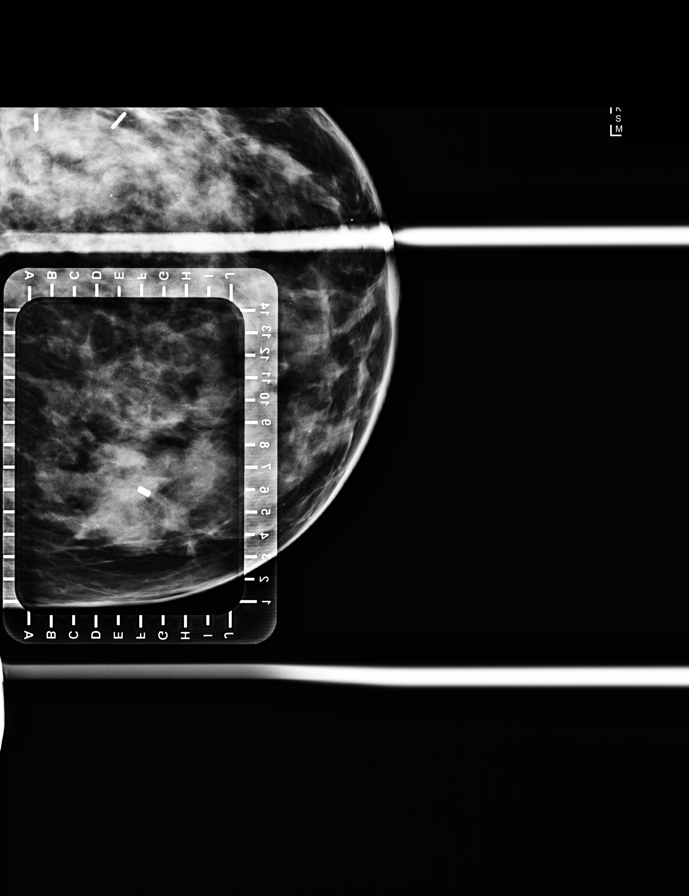

[L CC (1 of 3)]
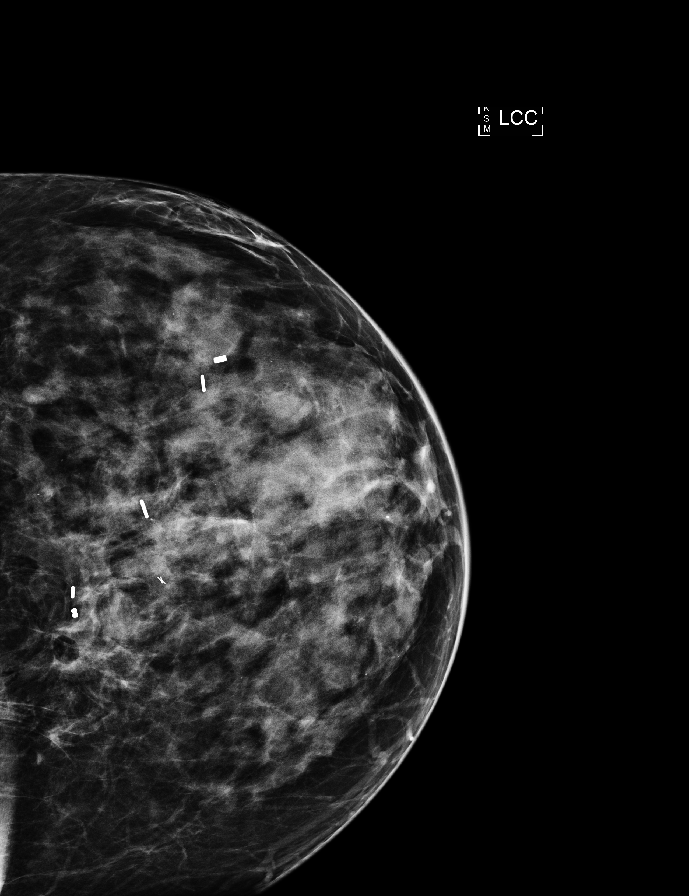

[L LM (4 of 5)]
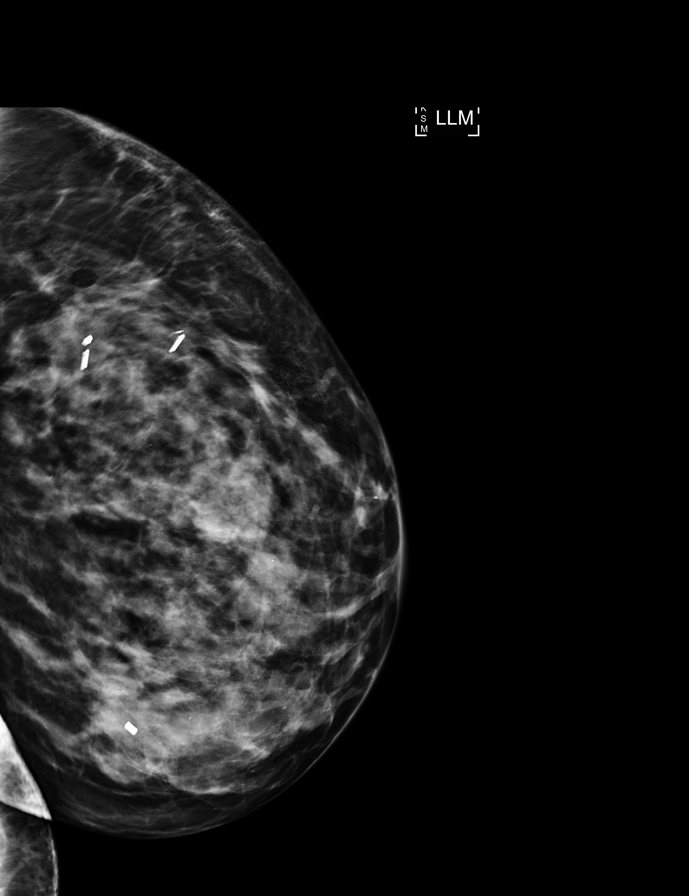

[L CC (2 of 3)]
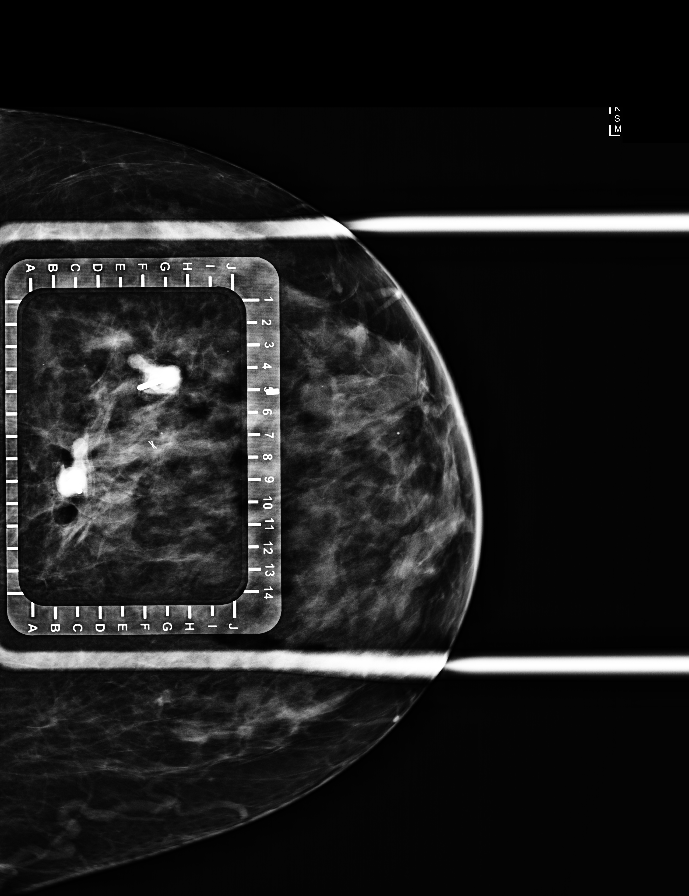

[L LM (5 of 5)]
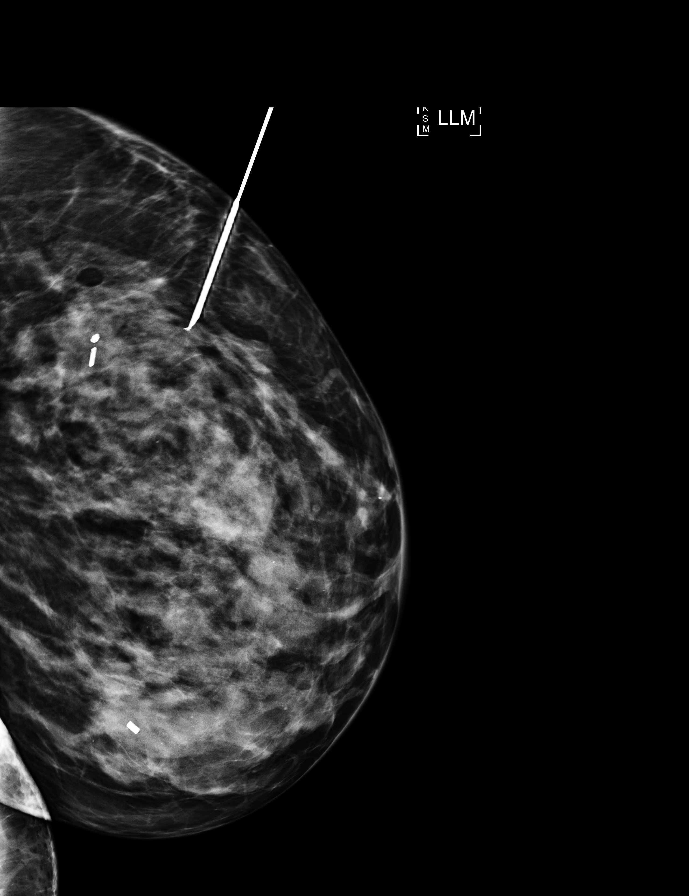

[L CC (3 of 3)]
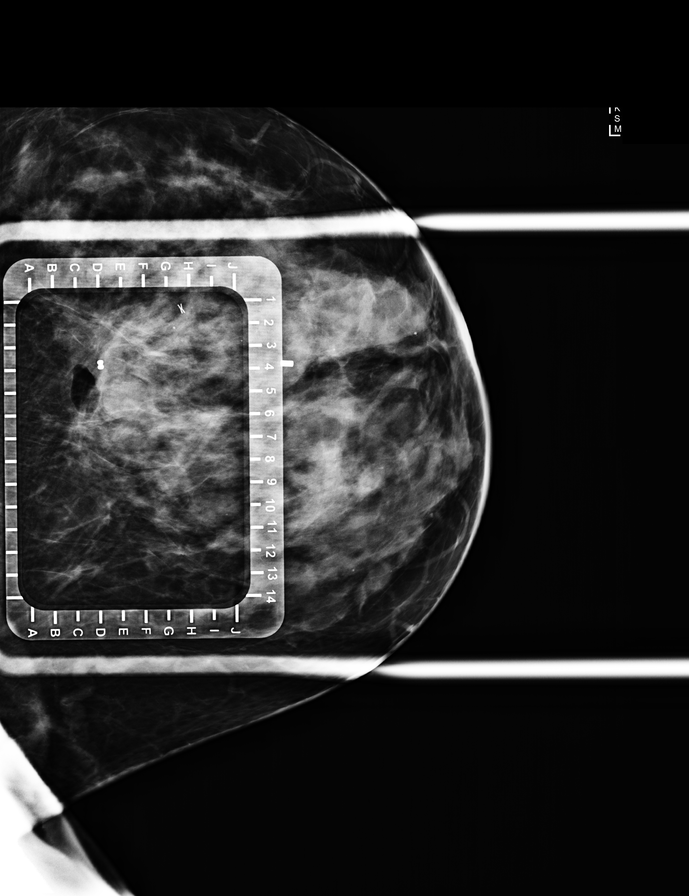

[8 of 15 positions shown; findings below may reference images not displayed]

FINDINGS: Patient presents for radioactive seed localization prior to surgical
excision. I met with the patient and we discussed the procedure of
seed localization including benefits and alternatives. We discussed
the high likelihood of a successful procedure. We discussed the
risks of the procedure including infection, bleeding, tissue injury
and further surgery. We discussed the low dose of radioactivity
involved in the procedure. Informed, written consent was given.

The usual time-out protocol was performed immediately prior to the
procedure.

Seed #1: Ribbon shaped biopsy clip, invasive lobular carcinoma.

Using mammographic guidance, sterile technique, 1% lidocaine and an
5-70G radioactive seed, the ribbon shaped biopsy clip was localized
using a superior approach. The follow-up mammogram images confirm
the seed in the expected location and were marked for Dr. Moalla.

Follow-up survey of the patient confirms presence of the radioactive
seed.

Order number of 5-70G seed:  [id number].

Total activity:  0.245 millicuries reference Date: 06/25/2021

Seed #2: Dumbbell shaped biopsy clip, lobular neoplasia with
atypical lobular hyperplasia.

Using mammographic guidance, sterile technique, 1% lidocaine and an
5-70G radioactive seed, the dumbbell shaped biopsy clip was
localized using a superior approach. The follow-up mammogram images
confirm the seed in the expected location and were marked for Dr.
Moalla.

Follow-up survey of the patient confirms presence of the radioactive
seed.

Order number of 5-70G seed:  [id number].

Total activity:  0.255 millicuries reference Date: 06/25/2021

Seed #3: Cylinder shaped biopsy clip, lobular neoplasia, lobular
carcinoma in situ.

Using mammographic guidance, sterile technique, 1% lidocaine and an
5-70G radioactive seed, the dumbbell shaped biopsy clip was
localized using a lateral approach. The follow-up mammogram images
confirm the seed in the expected location and were marked for Dr.
Moalla.

Follow-up survey of the patient confirms presence of the radioactive
seed.

Order number of 5-70G seed:  [id number].

Total activity:  0.245 millicuries reference Date: 06/25/2021

The patient tolerated the procedure well and was released from the
[REDACTED]. She was given instructions regarding seed removal.
IMPRESSION: Radioactive seed localization the left breast. Three radioactive
seeds placed as detailed above. No apparent complications.

## 2022-05-31 IMAGING — MG MM BREAST SURGICAL SPECIMEN
1 series · 1 of 1 positions shown · non-contrast
Comparison: Previous exam(s).

CLINICAL DATA: Surgical specimen following radioactive seed
localization left breast lesions.

EXAM:
SPECIMEN RADIOGRAPH OF THE LEFT BREAST

[L]
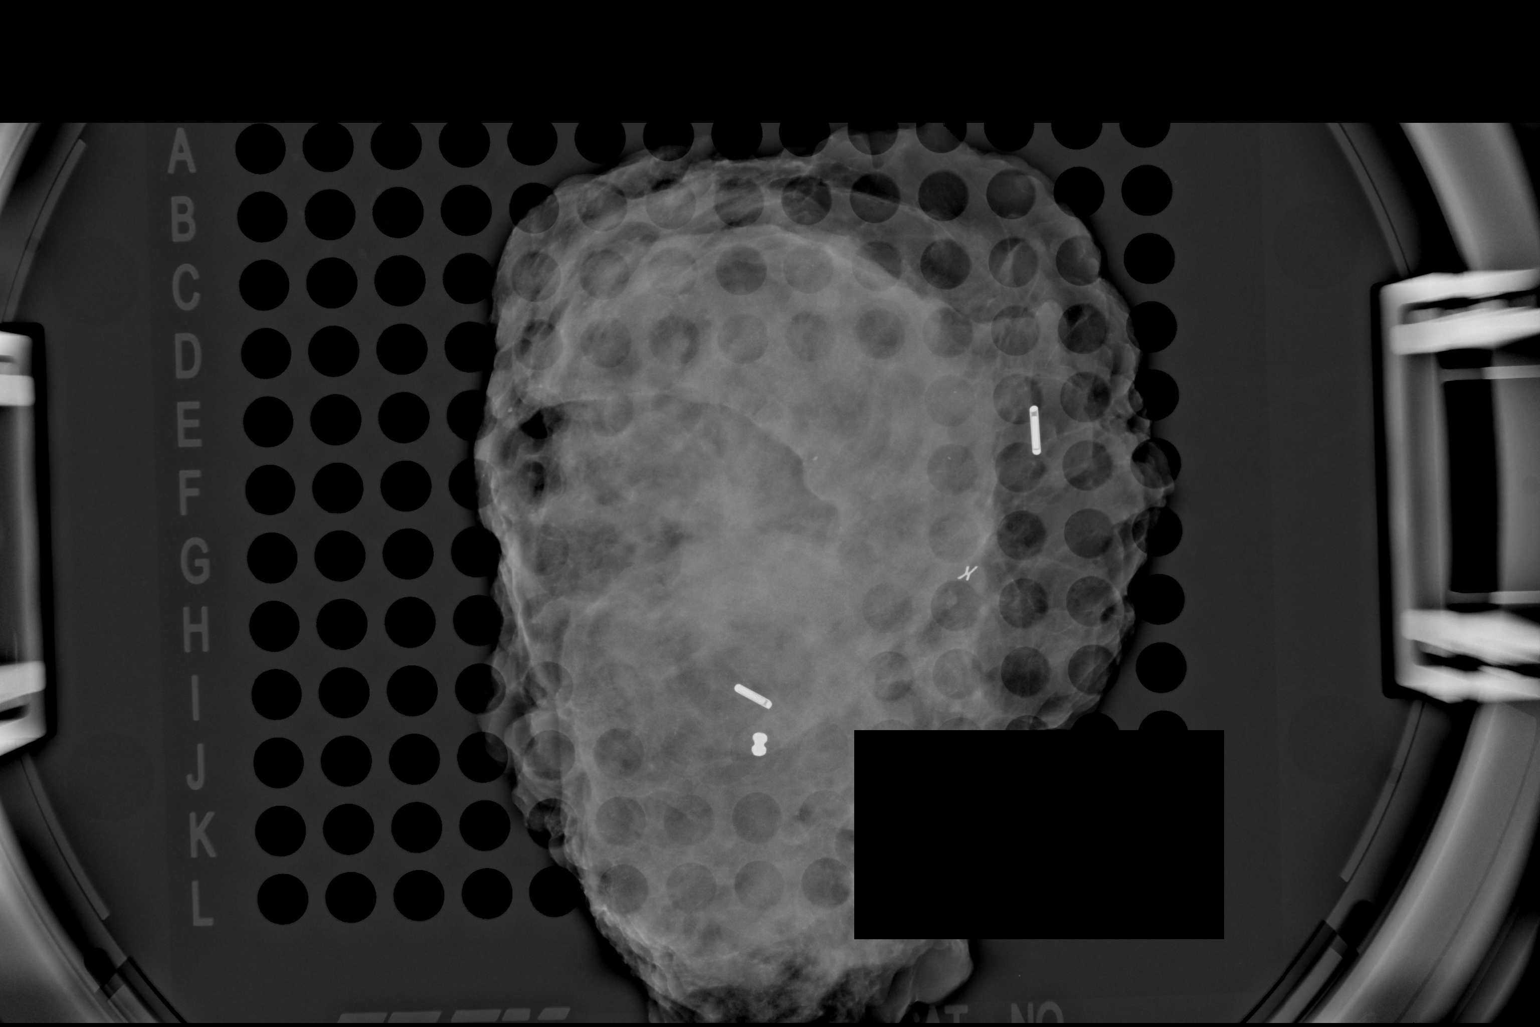

[1 of 1 positions shown; findings below may reference images not displayed]

FINDINGS: Status post excision of the left breast. The dumbbell shaped biopsy
clip and it's localizing radioactive seed, as well as the X shaped
biopsy clip and it's localizing seed, are present within the
specimen.
IMPRESSION: Specimen radiograph of the left breast.

## 2022-05-31 IMAGING — MG MM BREAST SURGICAL SPECIMEN
1 series · 1 of 1 positions shown · non-contrast
Comparison: Previous exam(s).

CLINICAL DATA: Assess surgical specimen after radioactive seed
localization of a left breast lesion. Two other left breast lesions
were in a separate specimen.

EXAM:
SPECIMEN RADIOGRAPH OF THE LEFT BREAST

[L]
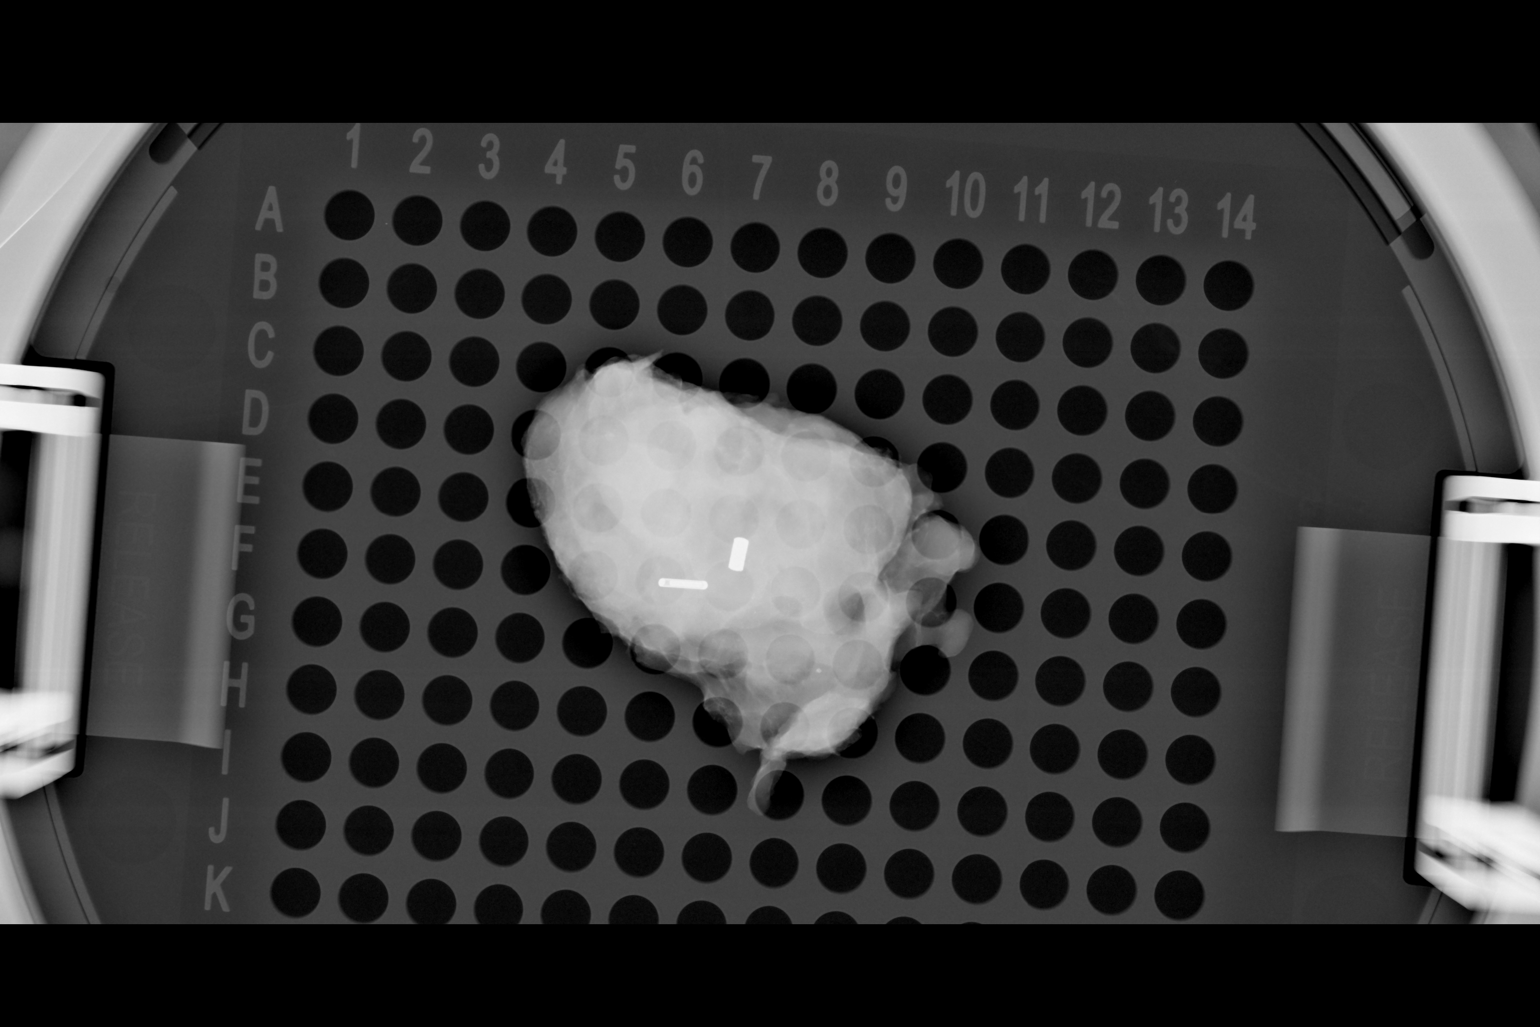

[1 of 1 positions shown; findings below may reference images not displayed]

FINDINGS: Status post excision of the left breast. The radioactive seed and
the cylinder shaped biopsy marker clip are present, completely
intact, and were marked for pathology.
IMPRESSION: Specimen radiograph of the left breast.

## 2022-06-08 ENCOUNTER — Ambulatory Visit
Admission: RE | Admit: 2022-06-08 | Discharge: 2022-06-08 | Disposition: A | Discharge status: Home / Self Care | Payer: No Typology Code available for payment source | Source: Ambulatory Visit | Attending: Adult Health | Admitting: Adult Health

## 2022-06-08 DIAGNOSIS — C50511 Malignant neoplasm of lower-outer quadrant of right female breast: Secondary | ICD-10-CM

## 2022-06-08 DIAGNOSIS — C50012 Malignant neoplasm of nipple and areola, left female breast: Secondary | ICD-10-CM

## 2022-06-22 ENCOUNTER — Other Ambulatory Visit: Payer: Self-pay | Admitting: Obstetrics and Gynecology

## 2022-06-22 DIAGNOSIS — Z853 Personal history of malignant neoplasm of breast: Secondary | ICD-10-CM

## 2022-07-13 ENCOUNTER — Ambulatory Visit
Admission: RE | Admit: 2022-07-13 | Discharge: 2022-07-13 | Disposition: A | Discharge status: Home / Self Care | Payer: No Typology Code available for payment source | Source: Ambulatory Visit | Attending: Obstetrics and Gynecology | Admitting: Obstetrics and Gynecology

## 2022-07-13 DIAGNOSIS — Z853 Personal history of malignant neoplasm of breast: Secondary | ICD-10-CM

## 2022-07-13 MED ORDER — GADOPICLENOL 0.5 MMOL/ML IV SOLN
9.0000 mL | Freq: Once | INTRAVENOUS | Status: AC | PRN
  Administered 2022-07-13: 9 mL via INTRAVENOUS

## 2022-07-23 NOTE — Progress Notes (Signed)
Patient Care Team: Kathalene Frames, MD as PCP - General (Internal Medicine) Nicholas Lose, MD as Consulting Physician (Hematology and Oncology) Kyung Rudd, MD as Consulting Physician (Radiation Oncology) Erroll Luna, MD as Consulting Physician (General Surgery)  DIAGNOSIS:  Encounter Diagnosis  Name Primary?   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, estrogen receptor positive ([place])  07/02/2021 Initial Diagnosis   Left Breast Palpable lesion: 3.2 cm cyst, 2 adj masses 0.6 cm, and 0.7 cm Biopsy 07/02/21: ILC Grade 1, ER: 90%, PR 100%, Her 2: Neg, Ki 67: 2%   07/02/2021 Cancer Staging   Staging form: Breast, AJCC 8th Edition - Clinical stage from 07/02/2021: Stage IB (cT2, cN0, cM0, G1, ER+, PR+, HER2-) - Signed by Gardenia Phlegm, NP on 01/28/2022 Stage prefix: Initial diagnosis Histologic grading system: 3 grade system   10/09/2021 - 11/06/2021 Radiation Therapy   Site Technique Total Dose (Gy) Dose per Fx (Gy) Completed Fx Beam Energies  Breast, Left: Breast_L 3D 42.56/42.56 2.66 16/16 10X  Breast, Left: Breast_L_Bst 3D 8/8 2 4/4 10X      Surgery   A. BREAST, LEFT UPPER OUTER QUADRANT, LUMPECTOMY:  -  Invasive lobular carcinoma, presenting as few isolated tumor cells,  focally within 1 mm of the medial margin (slide A2).  -  Ductal carcinoma in situ, grade I (low), solid pattern, 5 mm focus, 1  mm from inferior margin.  -  Lobular neoplasia (/LCIS).  -  Prior biopsy  site changes.  -  Microcalcifications present.   B. BREAST, LEFT INFERIOR, LUMPECTOMY:  -  No invasive carcinoma identified.  -  Lobular neoplasia (LCIS).  -  Prior biopsy stie changes.  -  Microcalcifications present.   C. LYMPH NODE, LEFT AXILLARY, SENTINEL, EXCISION:  -  Lymph node negative for malignancy (0/1).   D. LYMPH NODE, LEFT AXILLARY, SENTINEL, EXCISION:  -  Lymph node negative for malignancy (0/1).   E. LYMPH NODE, LEFT AXILLARY, SENTINEL, EXCISION:  -  Lymph node negative for malignancy (0/1).    12/03/2021 -  Anti-estrogen oral therapy   Letrozole daily x 7 years     CHIEF COMPLIANT:  Follow-up right breast cancer   INTERVAL HISTORY: Caitlin Mcneil is a 66 y.o. with above-mentioned history of right breast cancer. She presents to the clinic for a follow-up.     ALLERGIES:  is allergic to carbamazepine, penicillins, aspirin, ciprofloxacin, and ibuprofen.  MEDICATIONS:  Current Outpatient Medications  Medication Sig Dispense Refill   acetaminophen (TYLENOL) 650 MG CR tablet Take 1,300 mg by mouth daily.     Apoaequorin (PREVAGEN) 10 MG CAPS Take 10 mg by mouth daily.     B Complex Vitamins (VITAMIN B COMPLEX) TABS Take 1 tablet by mouth every evening.     BD PEN NEEDLE NANO 2ND GEN 32G X 4 MM MISC      carboxymethylcellulose (REFRESH PLUS) 0.5 % SOLN Place 1 drop into both eyes daily.     cetirizine (ZYRTEC) 10 MG tablet Take 10 mg by  mouth daily.     Dulaglutide 1.5 MG/0.5ML SOPN Inject 1.5 mg into the skin once a week.  Pt takes medication on  Saturday.     fluticasone (FLONASE) 50 MCG/ACT nasal spray Place 2 sprays into both nostrils daily. (Patient not taking: Reported on 01/23/2022)     Guaifenesin 1200 MG TB12 Take 1,200 mg by mouth daily.     hydrOXYzine (ATARAX) 25 MG tablet Take 50 mg by mouth at bedtime.     Insulin Pen Needle (BD PEN NEEDLE NANO U/F) 32G X 4 MM MISC See admin instructions.     letrozole (FEMARA) 2.5 MG tablet Take 1 tablet (2.5 mg  total) by mouth daily. 90 tablet 4   melatonin 1 MG TABS tablet Take 2 mg by mouth at bedtime.     Multiple Vitamins-Minerals (MULTIVITAMIN ADULT EXTRA C PO) Take 1 tablet by mouth daily.     Omega-3 Fatty Acids (FISH OIL PO) Take 1 capsule by mouth every evening.     pregabalin (LYRICA) 100 MG capsule Take 100 mg by mouth 2 (two) times daily.     Probiotic Product (PROBIOTIC PO) Take 1 capsule by mouth daily.     QUEtiapine (SEROQUEL) 100 MG tablet Take 1 tablet (100 mg total) by mouth at bedtime. 30 tablet 0   rosuvastatin (CRESTOR) 20 MG tablet Take 20 mg by mouth at bedtime.     temazepam (RESTORIL) 15 MG capsule Take 1 capsule (15 mg total) by mouth at bedtime as needed for sleep. (Patient taking differently: Take 15 mg by mouth at bedtime.) 30 capsule 0   venlafaxine XR (EFFEXOR-XR) 75 MG 24 hr capsule Take 1 capsule (75 mg total) by mouth daily with breakfast. 90 capsule 3   No current facility-administered medications for this visit.    PHYSICAL EXAMINATION: ECOG PERFORMANCE STATUS: 1 - Symptomatic but completely ambulatory  Vitals:   07/28/22 1406  BP: (!) 135/94  Pulse: 85  Temp: 98.4 F (36.9 C)  SpO2: 97%   Filed Weights   07/28/22 1406  Weight: 197 lb 8 oz (89.6 kg)    BREAST: No palpable masses or nodules in either right or left breasts. No palpable axillary supraclavicular or infraclavicular adenopathy no breast tenderness or nipple discharge. (exam performed in the presence of a chaperone)  LABORATORY DATA:  I have reviewed the data as listed    Latest Ref Rng & Units 08/25/2021   11:49 AM 12/24/2014    3:45 AM 12/23/2014    1:40 PM  CMP  Glucose 70 - 99 mg/dL 122  82  104   BUN 8 - 23 mg/dL _0 Creatinine 0.44 - 1.00 mg/dL 0.73  0.68  0.67   Sodium 135 - 145 mmol/L 140  143  140   Potassium 3.5 - 5.1 mmol/L 4.3  4.3  4.2   Chloride 98 - 111 mmol/L 105  109  106   CO2 22 - 32 mmol/L _1 Calcium 8.9 - 10.3 mg/dL 9.3  8.7  8.9   Total  Protein 6.5 - 8.1 g/dL   6.7   Total Bilirubin 0.3 - 1.2 mg/dL   0.6   Alkaline Phos 38 - 126 U/L   44   AST 15 - 41 U/L   15   ALT 14 - 54 U/L   12     Lab Results  Component Value Date   WBC 7.1 08/25/2021   HGB 13.4  08/25/2021   HCT 40.7 08/25/2021   MCV 90.6 08/25/2021   PLT 152 08/25/2021   NEUTROABS 6.3 09/18/2014    ASSESSMENT & PLAN:  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  No role of Oncotype DX testing because of the small size 2. adjuvant radiation 10/10/2021-11/06/2021 3.  Adjuvant antiestrogen therapy with letrozole to start 12/03/2021   She has tapered off and discontinued estradiol patches. Severe hot flashes: I sent a prescription for Effexor 37.5 mg daily today.  She is doing significantly better with Effexor with regards to the hot flashes.    Letrozole toxicities: Hot flashes: Have improved since she is on Effexor.  She will try switching the time of the day that she takes medications if it makes it any better.  Breast cancer surveillance: Mammogram 06/08/2022: Benign postsurgical changes breast density category C Breast MRI 07/14/2022: Left breast surgical changes with mild ill-defined non-mass enhancement.  59-monthfollow-up breast MRI recommended  Return to clinic in 1 year for follow-up   No orders of the defined types were placed in this encounter.  The patient has a good understanding of the overall plan. she agrees with it. she will call with any problems that may develop before the next visit  here. Total time spent: 30 mins including face to face time and time spent for planning, charting and co-ordination of care   VHarriette Ohara MD 07/28/22    I DGardiner Coinsam acting as a sEducation administratorfor [employer] I have reviewed the above documentation for accuracy and completeness, and I agree with the above.

## 2022-07-28 ENCOUNTER — Other Ambulatory Visit: Payer: Self-pay

## 2022-07-28 ENCOUNTER — Inpatient Hospital Stay
Payer: No Typology Code available for payment source | Attending: Hematology and Oncology | Admitting: Hematology and Oncology

## 2022-07-28 VITALS — BP 135/94 | HR 85 | Temp 98.4°F | Wt 197.5 lb

## 2022-07-28 DIAGNOSIS — Z800 Family history of malignant neoplasm of digestive organs: Secondary | ICD-10-CM | POA: Diagnosis not present

## 2022-07-28 DIAGNOSIS — Z923 Personal history of irradiation: Secondary | ICD-10-CM | POA: Diagnosis not present

## 2022-07-28 DIAGNOSIS — Z170 Estrogen receptor positive status [ER+]: Secondary | ICD-10-CM | POA: Diagnosis not present

## 2022-07-28 DIAGNOSIS — Z79811 Long term (current) use of aromatase inhibitors: Secondary | ICD-10-CM | POA: Diagnosis not present

## 2022-07-28 DIAGNOSIS — C50012 Malignant neoplasm of nipple and areola, left female breast: Secondary | ICD-10-CM | POA: Diagnosis not present

## 2022-07-28 DIAGNOSIS — Z803 Family history of malignant neoplasm of breast: Secondary | ICD-10-CM | POA: Diagnosis not present

## 2022-07-28 MED ORDER — TRULICITY 3 MG/0.5ML ~~LOC~~ SOAJ: 3.0000 mg | SUBCUTANEOUS | Status: DC

## 2022-07-28 MED ORDER — TELMISARTAN 20 MG PO TABS: 20.0000 mg | ORAL_TABLET | Freq: Every day | ORAL | Status: AC

## 2022-07-28 MED ORDER — LETROZOLE 2.5 MG PO TABS: 2.5000 mg | ORAL_TABLET | Freq: Every day | ORAL | 4 refills | Status: DC

## 2022-07-28 NOTE — Assessment & Plan Note (Signed)
07/02/2021: Left Breast Palpable lesion: 3.2 cm cyst, 2 adj masses 0.6 cm, and 0.7 cm Biopsy 07/02/21: ILC Grade 1, ER: 90%, PR 100%, Her 2: Neg, Ki 67: 2%   08/28/2021: Left lumpectomy: Grade 1 through 2 invasive lobular cancer presenting as few isolated tumor cells, grade 1 DCIS, LCIS, 0/3 lymph nodes negative (tumor size 4 mm) ER 90%, PR 100%, HER2 negative, Ki-67 2%   Treatment plan: 1.  No role of Oncotype DX testing because of the small size 2. adjuvant radiation 10/10/2021-11/06/2021 3.  Adjuvant antiestrogen therapy with letrozole to start 12/03/2021   She has tapered off and discontinued estradiol patches. Severe hot flashes: I sent a prescription for Effexor 37.5 mg daily today.  She is doing significantly better with Effexor with regards to the hot flashes.    Letrozole toxicities:   Breast cancer surveillance: Breast exam 07/28/2022: Benign Mammogram 06/08/2022: Benign postsurgical changes breast density category C Breast MRI 07/14/2022: Left breast surgical changes with mild ill-defined non-mass enhancement.  43-monthfollow-up breast MRI recommended

## 2022-07-31 ENCOUNTER — Telehealth: Payer: Self-pay | Admitting: Hematology and Oncology

## 2022-07-31 NOTE — Telephone Encounter (Signed)
Scheduled appointment per 1/2 los. Patient is aware. 

## 2022-09-07 ENCOUNTER — Ambulatory Visit: Payer: No Typology Code available for payment source

## 2022-09-29 ENCOUNTER — Other Ambulatory Visit: Payer: Self-pay | Admitting: Adult Health

## 2022-09-29 DIAGNOSIS — C50511 Malignant neoplasm of lower-outer quadrant of right female breast: Secondary | ICD-10-CM

## 2022-09-29 DIAGNOSIS — C50012 Malignant neoplasm of nipple and areola, left female breast: Secondary | ICD-10-CM

## 2022-11-09 ENCOUNTER — Ambulatory Visit: Payer: No Typology Code available for payment source | Attending: Surgery | Admitting: Physical Therapy

## 2022-11-09 DIAGNOSIS — Z483 Aftercare following surgery for neoplasm: Secondary | ICD-10-CM

## 2022-11-09 NOTE — Therapy (Signed)
OUTPATIENT PHYSICAL THERAPY SOZO SCREENING NOTE   Patient Name: Caitlin Mcneil MRN: [id number] DOB:1956-03-06, 67 y.o., female Today's Date: 11/09/2022  PCP: Emilio Aspen, MD REFERRING PROVIDER: Harriette Bouillon, MD   PT End of Session - 11/09/22 1013     Visit Number 1    PT Start Time 1000    PT Stop Time 1012    PT Time Calculation (min) 12 min    Activity Tolerance Patient tolerated treatment well    Behavior During Therapy [facility] for tasks assessed/performed             Past Medical History:  Diagnosis Date   Anxiety    Arthritis    hands   Bipolar 1 disorder (HCC)    Breast cancer (HCC) 03/30/2013   right breast   Breast cancer (HCC) 07/02/2021   Left Breast   Contact lens/glasses fitting    wears contacts or glasses   Depression    Diabetes mellitus without complication (HCC)    type 2   History of radiation therapy 110/10/14-12/26/14   right breast, 60.4Gy   Personal history of radiation therapy 2014   right breast   Past Surgical History:  Procedure Laterality Date   ABDOMINAL HYSTERECTOMY  1996   BREAST BIOPSY Right 03/30/2013   Ductal carcinoma in situ/calcifications,loq   BREAST LUMPECTOMY Right 05/02/2013   BREAST LUMPECTOMY WITH NEEDLE LOCALIZATION AND AXILLARY SENTINEL LYMPH NODE BX Right 05/02/2013   Procedure: BREAST LUMPECTOMY WITH NEEDLE LOCALIZATION AND AXILLARY SENTINEL LYMPH NODE BX;  Surgeon: Clovis Pu. 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, estrogen receptor positive 07/13/2021   Bipolar I disorder, most recent episode depressed 12/25/2014   Overdose 12/23/2014   Hot flashes not due to menopause 03/14/2014   History of breast cancer 08/29/2013   Breast cancer of lower-outer quadrant of right female breast 04/05/2013    REFERRING DIAG: left breast cancer at risk for lymphedema  THERAPY DIAG:  Aftercare following surgery for neoplasm  PERTINENT HISTORY: Left lumpectomy (x2 at same time) on 08/28/2021 showed invasive lobular carcinoma and grade 1 DCIS in the upper outer quadrant, LCIS in the inferior quadrant, and axillary lymph nodes negative for carcinoma. Rt breast cancer with lumpectomy and SLNB   PRECAUTIONS: left UE Lymphedema risk  SUBJECTIVE: Here for SOZO screen  PAIN:  Are you having pain? No  SOZO SCREENING: Patient was assessed today using the SOZO machine to determine the lymphedema index score. This was compared to her baseline score. It was determined that she is within the recommended range when compared to her baseline and no further action is needed at this time. She will continue SOZO screenings. These are done every 3 months for 2 years post operatively followed by every 6 months for 2 years, and then annually.   L-DEX FLOWSHEETS -  11/09/22 1000       L-DEX LYMPHEDEMA SCREENING   Measurement Type Unilateral    L-DEX MEASUREMENT EXTREMITY Upper Extremity    POSITION  Standing    DOMINANT SIDE Right    At Risk Side Left    BASELINE SCORE (UNILATERAL) -1.6    L-DEX SCORE (UNILATERAL) -2.4    VALUE CHANGE (UNILAT) -0.8             Bethann Punches, PT 11/09/22 10:15 AM

## 2022-11-12 ENCOUNTER — Other Ambulatory Visit: Payer: Self-pay

## 2022-11-12 ENCOUNTER — Telehealth: Payer: Self-pay

## 2022-11-12 DIAGNOSIS — Z170 Estrogen receptor positive status [ER+]: Secondary | ICD-10-CM

## 2022-11-12 NOTE — Telephone Encounter (Signed)
Returned Pt's all regarding breast MR. Pt asking if MR breast has been scheduled. Per last MD note:   "Breast MRI 07/14/2022: Left breast surgical changes with mild ill-defined non-mass enhancement. 66-month follow-up breast MRI recommended "  Breast MR ordered and scheduled. Telephone appt made in case there is a need to discuss results. Pt verbalized understanding.

## 2023-01-12 ENCOUNTER — Ambulatory Visit
Admission: RE | Admit: 2023-01-12 | Discharge: 2023-01-12 | Disposition: A | Discharge status: Home / Self Care | Payer: Medicare Other | Source: Ambulatory Visit | Attending: Hematology and Oncology

## 2023-01-12 DIAGNOSIS — Z170 Estrogen receptor positive status [ER+]: Secondary | ICD-10-CM

## 2023-01-12 MED ORDER — GADOPICLENOL 0.5 MMOL/ML IV SOLN
9.0000 mL | Freq: Once | INTRAVENOUS | Status: AC | PRN
  Administered 2023-01-12: 9 mL via INTRAVENOUS

## 2023-01-14 ENCOUNTER — Inpatient Hospital Stay: Payer: Medicare Other | Attending: Hematology and Oncology | Admitting: Hematology and Oncology

## 2023-01-14 DIAGNOSIS — C50012 Malignant neoplasm of nipple and areola, left female breast: Secondary | ICD-10-CM

## 2023-01-14 DIAGNOSIS — Z170 Estrogen receptor positive status [ER+]: Secondary | ICD-10-CM

## 2023-01-14 DIAGNOSIS — C50511 Malignant neoplasm of lower-outer quadrant of right female breast: Secondary | ICD-10-CM | POA: Diagnosis not present

## 2023-01-14 MED ORDER — VENLAFAXINE HCL ER 75 MG PO CP24: ORAL_CAPSULE | ORAL | 3 refills | Status: DC

## 2023-01-14 MED ORDER — LETROZOLE 2.5 MG PO TABS: 2.5000 mg | ORAL_TABLET | Freq: Every day | ORAL | 4 refills | Status: DC

## 2023-01-14 NOTE — Progress Notes (Signed)
HEMATOLOGY-ONCOLOGY TELEPHONE VISIT PROGRESS NOTE  I connected with our patient on 01/14/23 at 11:15 AM EDT by telephone and verified that I am speaking with the correct person using two identifiers.  I discussed the limitations, risks, security and privacy concerns of performing an evaluation and management service by telephone and the availability of in person appointments.  I also discussed with the patient that there may be a patient responsible charge related to this service. The patient expressed understanding and agreed to proceed.   History of Present Illness: Follow-up to discuss results of the breast MRI.  She is tolerating antiestrogen therapy extremely well.  Effexor is working well to prevent hot flashes.  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, estrogen receptor positive (HCC)  07/02/2021 Initial Diagnosis   Left Breast Palpable lesion: 3.2 cm cyst, 2 adj masses 0.6 cm, and 0.7 cm Biopsy 07/02/21: ILC Grade 1, ER: 90%, PR 100%, Her 2: Neg, Ki 67: 2%   07/02/2021 Cancer Staging   Staging form: Breast, AJCC 8th Edition - Clinical stage from 07/02/2021: Stage IB (cT2, cN0, cM0, G1, ER+, PR+, HER2-) - Signed by Loa Socks, NP on 01/28/2022 Stage prefix: Initial diagnosis Histologic grading system: 3 grade system   10/09/2021 - 11/06/2021 Radiation Therapy   Site Technique Total Dose (Gy) Dose per Fx (Gy) Completed Fx Beam Energies  Breast, Left: Breast_L 3D 42.56/42.56 2.66 16/16 10X  Breast, Left: Breast_L_Bst 3D 8/8 2 4/4 10X      Surgery   A. BREAST, LEFT UPPER OUTER QUADRANT, LUMPECTOMY:  -  Invasive lobular  carcinoma, presenting as few isolated tumor cells,  focally within 1 mm of the medial margin (slide A2).  -  Ductal carcinoma in situ, grade I (low), solid pattern, 5 mm focus, 1  mm from inferior margin.  -  Lobular neoplasia (/LCIS).  -  Prior biopsy site changes.  -  Microcalcifications present.   B. BREAST, LEFT INFERIOR, LUMPECTOMY:  -  No invasive carcinoma identified.  -  Lobular neoplasia (LCIS).  -  Prior biopsy stie changes.  -  Microcalcifications present.   C. LYMPH NODE, LEFT AXILLARY, SENTINEL, EXCISION:  -  Lymph node negative for malignancy (0/1).   D. LYMPH NODE, LEFT AXILLARY, SENTINEL, EXCISION:  -  Lymph node negative for malignancy (0/1).   E. LYMPH NODE, LEFT AXILLARY, SENTINEL, EXCISION:  -  Lymph node negative for malignancy (0/1).    12/03/2021 -  Anti-estrogen oral therapy   Letrozole daily x 7 years     REVIEW OF SYSTEMS:   Constitutional: Denies fevers, chills or abnormal weight loss All other systems were reviewed with the patient and are negative. Observations/Objective:     Assessment Plan:  Breast cancer of lower-outer quadrant of right female breast (HCC) 07/02/2021: Left Breast Palpable lesion: 3.2 cm cyst, 2 adj masses 0.6 cm, and 0.7 cm Biopsy 07/02/21: ILC Grade 1, ER: 90%, PR 100%, Her 2: Neg, Ki 67: 2%   08/28/2021: Left lumpectomy: Grade 1 through 2 invasive lobular cancer presenting as few isolated tumor cells, grade 1 DCIS, LCIS, 0/3 lymph nodes negative (tumor size 4 mm) ER 90%, PR 100%, HER2  negative, Ki-67 2%   Treatment plan: 1.  No role of Oncotype DX testing because of the small size 2. adjuvant radiation 10/10/2021-11/06/2021 3.  Adjuvant antiestrogen therapy with letrozole to start 12/03/2021   She has tapered off and discontinued estradiol patches.    Letrozole toxicities: Hot flashes: Mild. Improved with Effexor   Breast cancer surveillance: Mammogram 06/08/2022: Benign postsurgical changes breast density category  C Breast MRI 01/12/2023: Postoperative changes and appearance of fat necrosis.  I recommend that we do annual breast MRIs.  She has an appointment in January for in person follow-up.  After that we can see her once a year.    I discussed the assessment and treatment plan with the patient. The patient was provided an opportunity to ask questions and all were answered. The patient agreed with the plan and demonstrated an understanding of the instructions. The patient was advised to call back or seek an in-person evaluation if the symptoms worsen or if the condition fails to improve as anticipated.   I provided 12 minutes of non-face-to-face time during this encounter.  This includes time for charting and coordination of care   Tamsen Meek, MD

## 2023-01-14 NOTE — Assessment & Plan Note (Signed)
07/02/2021: Left Breast Palpable lesion: 3.2 cm cyst, 2 adj masses 0.6 cm, and 0.7 cm Biopsy 07/02/21: ILC Grade 1, ER: 90%, PR 100%, Her 2: Neg, Ki 67: 2%   08/28/2021: Left lumpectomy: Grade 1 through 2 invasive lobular cancer presenting as few isolated tumor cells, grade 1 DCIS, LCIS, 0/3 lymph nodes negative (tumor size 4 mm) ER 90%, PR 100%, HER2 negative, Ki-67 2%   Treatment plan: 1.  No role of Oncotype DX testing because of the small size 2. adjuvant radiation 10/10/2021-11/06/2021 3.  Adjuvant antiestrogen therapy with letrozole to start 12/03/2021   She has tapered off and discontinued estradiol patches.    Letrozole toxicities: Hot flashes: Mild. Improved with Effexor   Breast cancer surveillance: Mammogram 06/08/2022: Benign postsurgical changes breast density category C Breast MRI 01/12/2023: Postoperative changes and appearance of fat necrosis.  I recommend that we do annual breast MRIs.  She has an appointment in January for in person follow-up.  After that we can see her once a year.

## 2023-01-15 ENCOUNTER — Telehealth: Payer: No Typology Code available for payment source | Admitting: Hematology and Oncology

## 2023-02-01 ENCOUNTER — Ambulatory Visit: Payer: Medicare Other | Attending: Surgery

## 2023-02-01 VITALS — Wt 190.4 lb

## 2023-02-01 DIAGNOSIS — Z483 Aftercare following surgery for neoplasm: Secondary | ICD-10-CM | POA: Insufficient documentation

## 2023-02-01 NOTE — Therapy (Signed)
OUTPATIENT PHYSICAL THERAPY SOZO SCREENING NOTE   Patient Name: Caitlin Mcneil MRN: [id number] DOB:[birth year], 67 y.o., female Today's Date: 02/01/2023  PCP: Emilio Aspen, MD REFERRING PROVIDER: Harriette Bouillon, MD   PT End of Session - 02/01/23 1213     Visit Number 1   # unchanged due to screen only   PT Start Time 1211    PT Stop Time 1214    PT Time Calculation (min) 3 min    Activity Tolerance Patient tolerated treatment well    Behavior During Therapy [facility] for tasks assessed/performed             Past Medical History:  Diagnosis Date   Anxiety    Arthritis    hands   Bipolar 1 disorder (HCC)    Breast cancer (HCC) 03/30/2013   right breast   Breast cancer (HCC) 07/02/2021   Left Breast   Contact lens/glasses fitting    wears contacts or glasses   Depression    Diabetes mellitus without complication (HCC)    type 2   History of radiation therapy 110/10/14-12/26/14   right breast, 60.4Gy   Personal history of radiation therapy 2014   right breast   Past Surgical History:  Procedure Laterality Date   ABDOMINAL HYSTERECTOMY  1996   BREAST BIOPSY Right 03/30/2013   Ductal carcinoma in situ/calcifications,loq   BREAST LUMPECTOMY Right 05/02/2013   BREAST LUMPECTOMY WITH NEEDLE LOCALIZATION AND AXILLARY SENTINEL LYMPH NODE BX Right 05/02/2013   Procedure: BREAST LUMPECTOMY WITH NEEDLE LOCALIZATION AND AXILLARY SENTINEL LYMPH NODE BX;  Surgeon: Clovis Pu. 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, estrogen receptor positive (HCC) 07/13/2021   Bipolar I disorder, most recent episode depressed (HCC) 12/25/2014   Overdose 12/23/2014   Hot flashes not due to menopause 03/14/2014   History of breast cancer 08/29/2013   Breast cancer of lower-outer quadrant of right female breast (HCC) 04/05/2013    REFERRING DIAG: left breast cancer at risk for lymphedema  THERAPY DIAG:  Aftercare following surgery for neoplasm  PERTINENT HISTORY: Left lumpectomy (x2 at same time) on 08/28/2021 showed invasive lobular carcinoma and grade 1 DCIS in the upper outer quadrant, LCIS in the inferior quadrant, and axillary lymph nodes negative for carcinoma. Rt breast cancer with lumpectomy and SLNB   PRECAUTIONS: left UE Lymphedema risk  SUBJECTIVE: Here for SOZO screen  PAIN:  Are you having pain? No  SOZO SCREENING: Patient was assessed today using the SOZO machine to determine the lymphedema index score. This was compared to her baseline score. It was determined that she is within the recommended range when compared to her baseline and no further action is needed at this time. She will continue SOZO screenings. These are done every 3 months for 2 years post operatively followed by every 6 months  for 2 years, and then annually.   L-DEX FLOWSHEETS - 02/01/23 1200       L-DEX LYMPHEDEMA SCREENING   Measurement Type Unilateral    L-DEX MEASUREMENT EXTREMITY Upper Extremity    POSITION  Standing    DOMINANT SIDE Right    At Risk Side Left    BASELINE SCORE (UNILATERAL) -1.6    L-DEX SCORE (UNILATERAL) -4.2    VALUE CHANGE (UNILAT) -2.6             Berna Spare, PTA 02/01/23 12:18 PM

## 2023-02-23 ENCOUNTER — Other Ambulatory Visit: Payer: Self-pay | Admitting: Adult Health

## 2023-02-23 DIAGNOSIS — Z853 Personal history of malignant neoplasm of breast: Secondary | ICD-10-CM

## 2023-02-23 DIAGNOSIS — Z9889 Other specified postprocedural states: Secondary | ICD-10-CM

## 2023-05-10 ENCOUNTER — Ambulatory Visit: Payer: Medicare Other | Attending: Surgery

## 2023-05-10 VITALS — Wt 191.4 lb

## 2023-05-10 DIAGNOSIS — Z483 Aftercare following surgery for neoplasm: Secondary | ICD-10-CM | POA: Insufficient documentation

## 2023-05-10 NOTE — Therapy (Signed)
OUTPATIENT PHYSICAL THERAPY SOZO SCREENING NOTE   Patient Name: Caitlin Mcneil MRN: [id number] DOB:Apr 17, 1956, 67 y.o., female Today's Date: 05/10/2023  PCP: Emilio Aspen, MD REFERRING PROVIDER: Harriette Bouillon, MD   PT End of Session - 05/10/23 1532     Visit Number 1   # unchanged due to screen only   PT Start Time 1531    PT Stop Time 1535    PT Time Calculation (min) 4 min    Activity Tolerance Patient tolerated treatment well    Behavior During Therapy [facility] for tasks assessed/performed             Past Medical History:  Diagnosis Date   Anxiety    Arthritis    hands   Bipolar 1 disorder (HCC)    Breast cancer (HCC) 03/30/2013   right breast   Breast cancer (HCC) 07/02/2021   Left Breast   Contact lens/glasses fitting    wears contacts or glasses   Depression    Diabetes mellitus without complication (HCC)    type 2   History of radiation therapy 110/10/14-12/26/14   right breast, 60.4Gy   Personal history of radiation therapy 2014   right breast   Past Surgical History:  Procedure Laterality Date   ABDOMINAL HYSTERECTOMY  1996   BREAST BIOPSY Right 03/30/2013   Ductal carcinoma in situ/calcifications,loq   BREAST LUMPECTOMY Right 05/02/2013   BREAST LUMPECTOMY WITH NEEDLE LOCALIZATION AND AXILLARY SENTINEL LYMPH NODE BX Right 05/02/2013   Procedure: BREAST LUMPECTOMY WITH NEEDLE LOCALIZATION AND AXILLARY SENTINEL LYMPH NODE BX;  Surgeon: Clovis Pu. 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, estrogen receptor positive (HCC) 07/13/2021   Bipolar I disorder, most recent episode depressed (HCC) 12/25/2014   Overdose 12/23/2014   Hot flashes not due to menopause 03/14/2014   History of breast cancer 08/29/2013   Breast cancer of lower-outer quadrant of right female breast (HCC) 04/05/2013    REFERRING DIAG: left breast cancer at risk for lymphedema  THERAPY DIAG:  Aftercare following surgery for neoplasm  PERTINENT HISTORY: Left lumpectomy (x2 at same time) on 08/28/2021 showed invasive lobular carcinoma and grade 1 DCIS in the upper outer quadrant, LCIS in the inferior quadrant, and axillary lymph nodes negative for carcinoma. Rt breast cancer with lumpectomy and SLNB   PRECAUTIONS: left UE Lymphedema risk  SUBJECTIVE: Pt returns for her 3 month L-Dex screen.   PAIN:  Are you having pain? No  SOZO SCREENING: Patient was assessed today using the SOZO machine to determine the lymphedema index score. This was compared to her baseline score. It was determined that she is within the recommended range when compared to her baseline and no further action is needed at this time. She will continue SOZO screenings. These are done every 3 months for 2 years post operatively  followed by every 6 months for 2 years, and then annually.   L-DEX FLOWSHEETS - 05/10/23 1500       L-DEX LYMPHEDEMA SCREENING   Measurement Type Unilateral    L-DEX MEASUREMENT EXTREMITY Upper Extremity    POSITION  Standing    DOMINANT SIDE Right    At Risk Side Left    BASELINE SCORE (UNILATERAL) -1.6    L-DEX SCORE (UNILATERAL) -2.3    VALUE CHANGE (UNILAT) -0.7             Berna Spare, PTA 05/10/23 3:35 PM

## 2023-06-11 ENCOUNTER — Ambulatory Visit
Admission: RE | Admit: 2023-06-11 | Discharge: 2023-06-11 | Disposition: A | Discharge status: Home / Self Care | Payer: Medicare Other | Source: Ambulatory Visit | Attending: Adult Health | Admitting: Adult Health

## 2023-06-11 DIAGNOSIS — Z9889 Other specified postprocedural states: Secondary | ICD-10-CM

## 2023-06-11 DIAGNOSIS — Z853 Personal history of malignant neoplasm of breast: Secondary | ICD-10-CM

## 2023-07-29 ENCOUNTER — Inpatient Hospital Stay: Payer: Medicare Other | Attending: Hematology and Oncology | Admitting: Adult Health

## 2023-07-29 ENCOUNTER — Encounter: Payer: Self-pay | Admitting: Adult Health

## 2023-07-29 VITALS — BP 136/94 | HR 80 | Temp 98.0°F | Resp 20 | Wt 193.4 lb

## 2023-07-29 DIAGNOSIS — R232 Flushing: Secondary | ICD-10-CM | POA: Insufficient documentation

## 2023-07-29 DIAGNOSIS — Z923 Personal history of irradiation: Secondary | ICD-10-CM | POA: Insufficient documentation

## 2023-07-29 DIAGNOSIS — Z170 Estrogen receptor positive status [ER+]: Secondary | ICD-10-CM | POA: Insufficient documentation

## 2023-07-29 DIAGNOSIS — Z79811 Long term (current) use of aromatase inhibitors: Secondary | ICD-10-CM | POA: Diagnosis not present

## 2023-07-29 DIAGNOSIS — Z800 Family history of malignant neoplasm of digestive organs: Secondary | ICD-10-CM | POA: Diagnosis not present

## 2023-07-29 DIAGNOSIS — C50012 Malignant neoplasm of nipple and areola, left female breast: Secondary | ICD-10-CM | POA: Insufficient documentation

## 2023-07-29 DIAGNOSIS — Z803 Family history of malignant neoplasm of breast: Secondary | ICD-10-CM | POA: Insufficient documentation

## 2023-07-29 DIAGNOSIS — Z7985 Long-term (current) use of injectable non-insulin antidiabetic drugs: Secondary | ICD-10-CM | POA: Diagnosis not present

## 2023-07-29 DIAGNOSIS — E119 Type 2 diabetes mellitus without complications: Secondary | ICD-10-CM | POA: Diagnosis not present

## 2023-07-29 DIAGNOSIS — Z79899 Other long term (current) drug therapy: Secondary | ICD-10-CM | POA: Diagnosis not present

## 2023-07-29 DIAGNOSIS — Z1239 Encounter for other screening for malignant neoplasm of breast: Secondary | ICD-10-CM | POA: Diagnosis not present

## 2023-07-29 DIAGNOSIS — C50511 Malignant neoplasm of lower-outer quadrant of right female breast: Secondary | ICD-10-CM

## 2023-07-29 MED ORDER — LETROZOLE 2.5 MG PO TABS: 2.5000 mg | ORAL_TABLET | Freq: Every day | ORAL | 4 refills | Status: AC

## 2023-07-29 NOTE — Assessment & Plan Note (Signed)
 07/02/2021: Left Breast Palpable lesion: 3.2 cm cyst, 2 adj masses 0.6 cm, and 0.7 cm Biopsy 07/02/21: ILC Grade 1, ER: 90%, PR 100%, Her 2: Neg, Ki 67: 2%   08/28/2021: Left lumpectomy: Grade 1 through 2 invasive lobular cancer presenting as few isolated tumor cells, grade 1 DCIS, LCIS, 0/3 lymph nodes negative (tumor size 4 mm) ER 90%, PR 100%, HER2 negative, Ki-67 2%   Treatment plan: 1.  No role of Oncotype DX testing because of the small size 2. adjuvant radiation 10/10/2021-11/06/2021 3.  Adjuvant antiestrogen therapy with letrozole  to start 12/03/2021   She has tapered off and discontinued estradiol  patches. Severe hot flashes: Effexor  75 mg a day  Invasive Lobular Carcinoma On Letrozole  with no reported side effects. No new breast changes. Mammogram in November 2024 showed no new or recurrent breast carcinoma. Breast MRI in June 2024 showed benign fat necrosis. -Continue Letrozole . -Continue Effexor  for hot flashes. -Order annual breast MRI for June 2025. -Physical exam today showed no signs of breast cancer recurrence.  Diabetes Medication recently changed, but not effectively controlling appetite or weight. -Continue current medication regimen. -Follow-up with primary care provider.  Bone Health Due for bone density scan in November 2025.  Return to clinic in 6 months for follow-up with Dr. Gudena

## 2023-07-29 NOTE — Progress Notes (Signed)
 [facility] Cancer Follow up:    Caitlin Mcneil LABOR, MD [address] [place] [address]   DIAGNOSIS:  Cancer Staging  Breast cancer of lower-outer quadrant of right female breast [facility]) Staging form: Breast, AJCC 7th Edition - Clinical: Stage 0 (Tis (DCIS), N0, cM0) - Unsigned Specimen type: Core Needle Biopsy Histopathologic type: 9932 Laterality: Right Staging comments: Staged at breast conference 04/05/13.  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, estrogen receptor positive (HCC) Staging form: Breast, AJCC 8th Edition - Clinical stage from 07/02/2021: Stage IB (cT2, cN0, cM0, G1, ER+, PR+, HER2-) - Signed by Caitlin Morna Pickle, NP on 01/28/2022 Stage prefix: Initial diagnosis Histologic grading system: 3 grade system   SUMMARY OF ONCOLOGIC HISTORY: 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, estrogen receptor positive (HCC)  07/02/2021 Initial Diagnosis   Left Breast Palpable lesion: 3.2 cm cyst, 2 adj masses 0.6 cm, and 0.7 cm Biopsy 07/02/21: ILC Grade 1, ER: 90%, PR 100%, Her 2: Neg, Ki 67: 2%   07/02/2021 Cancer Staging   Staging form: Breast, AJCC 8th Edition - Clinical stage from 07/02/2021: Stage IB (cT2, cN0, cM0, G1, ER+, PR+, HER2-) - Signed by Caitlin Morna Pickle, NP on 01/28/2022 Stage prefix: Initial diagnosis Histologic grading system: 3 grade system   10/09/2021 - 11/06/2021 Radiation Therapy   Site Technique Total  Dose (Gy) Dose per Fx (Gy) Completed Fx Beam Energies  Breast, Left: Breast_L 3D 42.56/42.56 2.66 16/16 10X  Breast, Left: Breast_L_Bst 3D 8/8 2 4/4 10X      Surgery   A. BREAST, LEFT UPPER OUTER QUADRANT, LUMPECTOMY:  -  Invasive lobular carcinoma, presenting as few isolated tumor cells,  focally within 1 mm of the medial margin (slide A2).  -  Ductal carcinoma in situ, grade I (low), solid pattern, 5 mm focus, 1  mm from inferior margin.  -  Lobular neoplasia (/LCIS).  -  Prior biopsy site changes.  -  Microcalcifications present.   B. BREAST, LEFT INFERIOR, LUMPECTOMY:  -  No invasive carcinoma identified.  -  Lobular neoplasia (LCIS).  -  Prior biopsy stie changes.  -  Microcalcifications present.   C. LYMPH NODE, LEFT AXILLARY, SENTINEL, EXCISION:  -  Lymph node negative for malignancy (0/1).   D. LYMPH NODE, LEFT AXILLARY, SENTINEL, EXCISION:  -  Lymph node negative for malignancy (0/1).   E. LYMPH NODE, LEFT AXILLARY, SENTINEL, EXCISION:  -  Lymph node negative for malignancy (0/1).    12/03/2021 -  Anti-estrogen oral therapy   Letrozole  daily x 7 years     CURRENT THERAPY: Letrozole   INTERVAL HISTORY:  Discussed the use of AI scribe software for clinical note transcription with the patient, who gave verbal consent to proceed.  Caitlin Mcneil 68 y.o. female with a history of invasive lobular carcinoma, is currently on letrozole  for breast cancer management. She reports no changes or issues with her breasts. The most recent mammogram in November showed no new or recurrent breast carcinoma.  The patient reports joint pain, specifically in the area between two bones where she received an injection due to the absence of cartilage. She questions if this could be a side effect of the letrozole  or simply age-related. However, she does not report any generalized joint issues, which are typically associated with letrozole  or anastrozole.  The patient also has diabetes and  is currently on Ozempic 2.5 once a week. She reports that her weight is not well controlled, despite the appetite-suppressing effects of the medication. She also reports experiencing hot flashes, primarily in the evenings. She is on Effexor , which she believes helps manage these symptoms, although it does not completely eliminate them.  The patient has recently started exercising, using a treadmill for 20 minutes a day. She reports no fatigue or discomfort from this new regimen. She is also on Prevagen 40, and takes Tylenol  as needed for pain.   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  MEDICAL HISTORY: Past Medical History:  Diagnosis Date   Anxiety    Arthritis    hands   Bipolar 1 disorder (HCC)    Breast cancer (HCC) 03/30/2013   right breast   Breast cancer (HCC) 07/02/2021   Left Breast   Contact lens/glasses fitting    wears contacts or glasses   Depression    Diabetes mellitus without complication (HCC)    type 2   History of radiation therapy 110/10/14-12/26/14   right breast, 60.4Gy   Personal history of radiation therapy 2014   right breast    SURGICAL HISTORY: Past Surgical History:  Procedure Laterality Date   ABDOMINAL HYSTERECTOMY  1996   BREAST BIOPSY Right 03/30/2013   Ductal carcinoma in situ/calcifications,loq   BREAST LUMPECTOMY Right 05/02/2013   BREAST LUMPECTOMY WITH NEEDLE LOCALIZATION AND AXILLARY SENTINEL LYMPH NODE BX Right 05/02/2013   Procedure: BREAST LUMPECTOMY WITH NEEDLE  LOCALIZATION AND AXILLARY SENTINEL LYMPH NODE BX;  Surgeon: Caitlin LABOR. Cornett, MD;  Location: [facility];  Service: General;  Laterality: Right;   BREAST LUMPECTOMY WITH RADIOACTIVE SEED AND SENTINEL LYMPH NODE BIOPSY Left 08/28/2021   Procedure: LEFT BREAST LUMPECTOMY WITH RADIOACTIVE SEED AND SENTINEL LYMPH NODE BIOPSY;  Surgeon: Vanderbilt Debby, MD;  Location: [facility];  Service: General;  Laterality: Left;   FOOT SURGERY  2002   lt hammer toe-reconstruction   HARDWARE REMOVAL  2002   lt  foot hardware out, pt thinks she still has a pin in her left ankle   WISDOM TOOTH EXTRACTION      SOCIAL HISTORY: Social History   Socioeconomic History   Marital status: Married    Spouse name: Not on file   Number of children: 1   Years of education: Not on file   Highest education level: Not on file  Occupational History    Employer: [employer]  Tobacco Use   Smoking status: Never   Smokeless tobacco: Never  Vaping Use   Vaping status: Never Used  Substance and Sexual Activity   Alcohol use: No   Drug use: No  Sexual activity: Not Currently  Other Topics Concern   Not on file  Social History Narrative   Not on file   Social Drivers of Health   Financial Resource Strain: Not on file  Food Insecurity: Not on file  Transportation Needs: Not on file  Physical Activity: Not on file  Stress: Not on file  Social Connections: Not on file  Intimate Partner Violence: Not At Risk (08/07/2021)   Humiliation, Afraid, Rape, and Kick questionnaire    Fear of Current or Ex-Partner: No    Emotionally Abused: No    Physically Abused: No    Sexually Abused: No    FAMILY HISTORY: Family History  Problem Relation Age of Onset   Colon cancer Father 39   Alzheimer's disease Mother    Alzheimer's disease Maternal Grandmother    Cancer Maternal Grandfather        Cancer - NOS   Breast cancer Other        maternal great aunt    Review of Systems  Constitutional:  Negative  for appetite change, chills, fatigue, fever and unexpected weight change.  HENT:   Negative for hearing loss, lump/mass and trouble swallowing.   Eyes:  Negative for eye problems and icterus.  Respiratory:  Negative for chest tightness, cough and shortness of breath.   Cardiovascular:  Negative for chest pain, leg swelling and palpitations.  Gastrointestinal:  Negative for abdominal distention, abdominal pain, constipation, diarrhea, nausea and vomiting.  Endocrine: Negative for hot flashes.  Genitourinary:  Negative for difficulty urinating.   Musculoskeletal:  Negative for arthralgias.  Skin:  Negative for itching and rash.  Neurological:  Negative for dizziness, extremity weakness, headaches and numbness.  Hematological:  Negative for adenopathy. Does not bruise/bleed easily.  Psychiatric/Behavioral:  Negative for depression. The patient is not nervous/anxious.       PHYSICAL EXAMINATION    Vitals:   07/29/23 1346  BP: (!) 136/94  Pulse: 80  Resp: 20  Temp: 98 F (36.7 C)  SpO2: 99%    Physical Exam  LABORATORY DATA:  CBC    Component Value Date/Time   WBC 7.1 08/25/2021 1149   RBC 4.49 08/25/2021 1149   HGB 13.4 08/25/2021 1149   HGB 13.1 09/18/2014 1342   HCT 40.7 08/25/2021 1149   HCT 39.8 09/18/2014 1342   PLT 152 08/25/2021 1149   PLT 140 (L) 09/18/2014 1342   MCV 90.6 08/25/2021 1149   MCV 93.0 09/18/2014 1342   MCH 29.8 08/25/2021 1149   MCHC 32.9 08/25/2021 1149   RDW 13.0 08/25/2021 1149   RDW 12.5 09/18/2014 1342   LYMPHSABS 1.1 09/18/2014 1342   MONOABS 0.4 09/18/2014 1342   EOSABS 0.0 09/18/2014 1342   BASOSABS 0.0 09/18/2014 1342    CMP     Component Value Date/Time   NA 140 08/25/2021 1149   NA 141 09/18/2014 1342   K 4.3 08/25/2021 1149   K 4.1 09/18/2014 1342   CL 105 08/25/2021 1149   CO2 30 08/25/2021 1149   CO2 27 09/18/2014 1342   GLUCOSE 122 (H) 08/25/2021 1149   GLUCOSE 87 09/18/2014 1342   BUN 21 08/25/2021 1149   BUN  17.3 09/18/2014 1342   CREATININE 0.73 08/25/2021 1149   CREATININE 0.7 09/18/2014 1342   CALCIUM 9.3 08/25/2021 1149   CALCIUM 9.0 09/18/2014 1342   PROT 6.7 12/23/2014 1340   PROT 6.9 09/18/2014 1342   ALBUMIN 3.9 12/23/2014 1340   ALBUMIN 4.2 09/18/2014 1342  AST 15 12/23/2014 1340   AST 16 09/18/2014 1342   ALT 12 (L) 12/23/2014 1340   ALT 9 09/18/2014 1342   ALKPHOS 44 12/23/2014 1340   ALKPHOS 48 09/18/2014 1342   BILITOT 0.6 12/23/2014 1340   BILITOT 0.80 09/18/2014 1342   GFRNONAA >60 08/25/2021 1149   GFRAA >60 12/24/2014 0345        ASSESSMENT and THERAPY PLAN:   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  No role of Oncotype DX testing because of the small size 2. adjuvant radiation 10/10/2021-11/06/2021 3.  Adjuvant antiestrogen therapy with letrozole  to start 12/03/2021   She has tapered off and discontinued estradiol  patches. Severe hot flashes: Effexor  75 mg a day  Invasive Lobular Carcinoma On Letrozole  with no reported side effects. No new breast changes. Mammogram in November 2024 showed no new or recurrent breast carcinoma. Breast MRI in June 2024 showed benign fat necrosis. -Continue Letrozole . -Continue Effexor  for hot flashes. -Order annual breast MRI for June 2025. -Physical exam today showed no signs of breast cancer recurrence.  Diabetes Medication recently changed, but not effectively controlling appetite or weight. -Continue current medication regimen. -Follow-up with primary care  provider.  Bone Health Due for bone density scan in November 2025.  Return to clinic in 6 months for follow-up with Dr. Gudena   All questions were answered. The patient knows to call the clinic with any problems, questions or concerns. We can certainly see the patient much sooner if necessary.  Total encounter time:20 minutes*in face-to-face visit time, chart review, lab review, care coordination, order entry, and documentation of the encounter time.    Morna Kendall, NP 07/29/23 4:24 PM Medical Oncology and Hematology [facility] [address] [place], [place] [address] Tel. [phone]    Fax. [phone]  *Total Encounter Time as defined by the Centers for Medicare and Medicaid Services includes, in addition to the face-to-face time of a patient visit (documented in the note above) non-face-to-face time: obtaining and reviewing outside history, ordering and reviewing medications, tests or procedures, care coordination (communications with other health care professionals or caregivers) and documentation in the medical record.

## 2023-08-09 ENCOUNTER — Ambulatory Visit: Payer: Medicare Other | Attending: Surgery

## 2023-08-09 VITALS — Wt 187.4 lb

## 2023-08-09 DIAGNOSIS — Z483 Aftercare following surgery for neoplasm: Secondary | ICD-10-CM | POA: Insufficient documentation

## 2023-08-09 NOTE — Therapy (Signed)
 OUTPATIENT PHYSICAL THERAPY SOZO SCREENING NOTE   Patient Name: Caitlin Mcneil MRN: [id number] DOB:1956-04-12, 68 y.o., female Today's Date: 08/09/2023  PCP: [name] LABOR, MD REFERRING PROVIDER: Vanderbilt Ned, MD   PT End of Session - 08/09/23 1531     Visit Number 1   # unchanged due to screen only   PT Start Time 1528    PT Stop Time 1532    PT Time Calculation (min) 4 min    Activity Tolerance Patient tolerated treatment well    Behavior During Therapy [facility] for tasks assessed/performed             Past Medical History:  Diagnosis Date   Anxiety    Arthritis    hands   Bipolar 1 disorder (HCC)    Breast cancer (HCC) 03/30/2013   right breast   Breast cancer (HCC) 07/02/2021   Left Breast   Contact lens/glasses fitting    wears contacts or glasses   Depression    Diabetes mellitus without complication (HCC)    type 2   History of radiation therapy 110/10/14-12/26/14   right breast, 60.4Gy   Personal history of radiation therapy 2014   right breast   Past Surgical History:  Procedure Laterality Date   ABDOMINAL HYSTERECTOMY  1996   BREAST BIOPSY Right 03/30/2013   Ductal carcinoma in situ/calcifications,loq   BREAST LUMPECTOMY Right 05/02/2013   BREAST LUMPECTOMY WITH NEEDLE LOCALIZATION AND AXILLARY SENTINEL LYMPH NODE BX Right 05/02/2013   Procedure: BREAST LUMPECTOMY WITH NEEDLE LOCALIZATION AND AXILLARY SENTINEL LYMPH NODE BX;  Surgeon: Ned LABOR. 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, estrogen receptor positive (HCC) 07/13/2021   Bipolar I disorder, most recent episode depressed (HCC) 12/25/2014   Overdose 12/23/2014   Hot flashes not due to menopause 03/14/2014   History of breast cancer 08/29/2013   Breast cancer of lower-outer quadrant of right female breast (HCC) 04/05/2013    REFERRING DIAG: left breast cancer at risk for lymphedema  THERAPY DIAG: Aftercare following surgery for neoplasm  PERTINENT HISTORY: Left lumpectomy (x2 at same time) on 08/28/2021 showed invasive lobular carcinoma and grade 1 DCIS in the upper outer quadrant, LCIS in the inferior quadrant, and axillary lymph nodes negative for carcinoma. Rt breast cancer with lumpectomy and SLNB   PRECAUTIONS: left UE Lymphedema risk  SUBJECTIVE: Pt returns for her last 3 month L-Dex screen.   PAIN:  Are you having pain? No  SOZO SCREENING: Patient was assessed today using the SOZO machine to determine the lymphedema index score. This was compared to her baseline score. It was determined that she is within the recommended range when compared to her baseline and no further action is needed at this time. She will continue SOZO screenings. These are done every 3 months for 2 years post operatively  followed by every 6 months for 2 years, and then annually.   L-DEX FLOWSHEETS - 08/09/23 1500       L-DEX LYMPHEDEMA SCREENING   Measurement Type Unilateral    L-DEX MEASUREMENT EXTREMITY Upper Extremity    POSITION  Standing    DOMINANT SIDE Right    At Risk Side Left    BASELINE SCORE (UNILATERAL) -1.6    L-DEX SCORE (UNILATERAL) -4.1    VALUE CHANGE (UNILAT) -2.5            P: Begin 6 month L-Dex screens next.   Berwyn Knights, PTA 08/09/23 3:33 PM

## 2024-01-13 ENCOUNTER — Ambulatory Visit
Admission: RE | Admit: 2024-01-13 | Discharge: 2024-01-13 | Disposition: A | Discharge status: Home / Self Care | Payer: Medicare Other | Source: Ambulatory Visit | Attending: Adult Health | Admitting: Adult Health

## 2024-01-13 DIAGNOSIS — Z170 Estrogen receptor positive status [ER+]: Secondary | ICD-10-CM

## 2024-01-13 DIAGNOSIS — C50012 Malignant neoplasm of nipple and areola, left female breast: Secondary | ICD-10-CM

## 2024-01-13 DIAGNOSIS — Z1239 Encounter for other screening for malignant neoplasm of breast: Secondary | ICD-10-CM

## 2024-01-13 DIAGNOSIS — C50511 Malignant neoplasm of lower-outer quadrant of right female breast: Secondary | ICD-10-CM

## 2024-01-13 MED ORDER — GADOPICLENOL 0.5 MMOL/ML IV SOLN
8.0000 mL | Freq: Once | INTRAVENOUS | Status: AC | PRN
Start: 2024-01-13 — End: 2024-01-13
  Administered 2024-01-13: 8 mL via INTRAVENOUS

## 2024-01-14 ENCOUNTER — Ambulatory Visit: Payer: Self-pay

## 2024-01-14 NOTE — Telephone Encounter (Addendum)
 Called pt per Np message below. Pt verbalized understanding.----- Message from Conway Dennis sent at 01/13/2024  3:15 PM EDT ----- MR negative, please give patient good news.  ----- Message ----- From: Interface, Rad Results In Sent: 01/13/2024   1:20 PM EDT To: Percival Brace, NP

## 2024-01-18 ENCOUNTER — Other Ambulatory Visit: Payer: Self-pay | Admitting: Hematology and Oncology

## 2024-01-18 DIAGNOSIS — C50012 Malignant neoplasm of nipple and areola, left female breast: Secondary | ICD-10-CM

## 2024-01-18 DIAGNOSIS — Z170 Estrogen receptor positive status [ER+]: Secondary | ICD-10-CM

## 2024-01-26 ENCOUNTER — Ambulatory Visit: Payer: Medicare Other | Admitting: Hematology and Oncology

## 2024-01-26 NOTE — Assessment & Plan Note (Signed)
 07/02/2021: Left Breast Palpable lesion: 3.2 cm cyst, 2 adj masses 0.6 cm, and 0.7 cm Biopsy 07/02/21: ILC Grade 1, ER: 90%, PR 100%, Her 2: Neg, Ki 67: 2%   08/28/2021: Left lumpectomy: Grade 1 through 2 invasive lobular cancer presenting as few isolated tumor cells, grade 1 DCIS, LCIS, 0/3 lymph nodes negative (tumor size 4 mm) ER 90%, PR 100%, HER2 negative, Ki-67 2%   Treatment plan: 1.  No role of Oncotype DX testing because of the small size 2. adjuvant radiation 10/10/2021-11/06/2021 3.  Adjuvant antiestrogen therapy with letrozole  to start 12/03/2021   She has tapered off and discontinued estradiol  patches.    Letrozole  toxicities: Hot flashes: Mild. Improved with Effexor    Breast cancer surveillance: Mammogram 06/11/2023: Benign postsurgical changes breast density category C Breast MRI 01/13/2024: Postoperative changes and appearance of fat necrosis.  I recommend that we do annual breast MRIs.  RTC in 1 year

## 2024-01-27 ENCOUNTER — Inpatient Hospital Stay: Attending: Hematology and Oncology | Admitting: Hematology and Oncology

## 2024-01-27 VITALS — BP 123/74 | HR 79 | Temp 98.0°F | Resp 17 | Ht 63.0 in | Wt 193.6 lb

## 2024-01-27 DIAGNOSIS — R232 Flushing: Secondary | ICD-10-CM | POA: Insufficient documentation

## 2024-01-27 DIAGNOSIS — Z79899 Other long term (current) drug therapy: Secondary | ICD-10-CM | POA: Insufficient documentation

## 2024-01-27 DIAGNOSIS — Z79811 Long term (current) use of aromatase inhibitors: Secondary | ICD-10-CM | POA: Diagnosis not present

## 2024-01-27 DIAGNOSIS — Z803 Family history of malignant neoplasm of breast: Secondary | ICD-10-CM | POA: Insufficient documentation

## 2024-01-27 DIAGNOSIS — Z800 Family history of malignant neoplasm of digestive organs: Secondary | ICD-10-CM | POA: Insufficient documentation

## 2024-01-27 DIAGNOSIS — Z923 Personal history of irradiation: Secondary | ICD-10-CM | POA: Insufficient documentation

## 2024-01-27 DIAGNOSIS — C50511 Malignant neoplasm of lower-outer quadrant of right female breast: Secondary | ICD-10-CM | POA: Insufficient documentation

## 2024-01-27 DIAGNOSIS — Z170 Estrogen receptor positive status [ER+]: Secondary | ICD-10-CM | POA: Insufficient documentation

## 2024-01-27 NOTE — Progress Notes (Signed)
 Patient Care Team: [name] LABOR, MD as PCP - General (Internal Medicine) Odean Potts, MD as Consulting Physician (Hematology and Oncology) Dewey Rush, MD as Consulting Physician (Radiation Oncology) Vanderbilt Ned, MD as Consulting Physician (General Surgery)  DIAGNOSIS:  Encounter Diagnosis  Name Primary?   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, estrogen receptor positive (HCC)  07/02/2021 Initial Diagnosis   Left Breast Palpable lesion: 3.2 cm cyst, 2 adj masses 0.6 cm, and 0.7 cm Biopsy 07/02/21: ILC Grade 1, ER: 90%, PR 100%, Her 2: Neg, Ki 67: 2%   07/02/2021 Cancer Staging   Staging form: Breast, AJCC 8th Edition - Clinical stage from 07/02/2021: Stage IB (cT2, cN0, cM0, G1, ER+, PR+, HER2-) - Signed by Crawford Morna Pickle, NP on 01/28/2022 Stage prefix: Initial diagnosis Histologic grading system: 3 grade system   10/09/2021 - 11/06/2021 Radiation Therapy   Site Technique Total Dose (Gy) Dose per Fx (Gy) Completed Fx Beam Energies  Breast, Left: Breast_L 3D 42.56/42.56 2.66 16/16 10X  Breast, Left: Breast_L_Bst 3D 8/8 2 4/4 10X      Surgery   A. BREAST, LEFT UPPER OUTER QUADRANT, LUMPECTOMY:  -  Invasive lobular carcinoma, presenting as few isolated tumor cells,  focally within 1 mm of the medial margin (slide A2).  -  Ductal carcinoma in situ, grade I (low), solid pattern, 5 mm focus, 1  mm from inferior margin.  -  Lobular neoplasia (/LCIS).  -  Prior biopsy site  changes.  -  Microcalcifications present.   B. BREAST, LEFT INFERIOR, LUMPECTOMY:  -  No invasive carcinoma identified.  -  Lobular neoplasia (LCIS).  -  Prior biopsy stie changes.  -  Microcalcifications present.   C. LYMPH NODE, LEFT AXILLARY, SENTINEL, EXCISION:  -  Lymph node negative for malignancy (0/1).   D. LYMPH NODE, LEFT AXILLARY, SENTINEL, EXCISION:  -  Lymph node negative for malignancy (0/1).   E. LYMPH NODE, LEFT AXILLARY, SENTINEL, EXCISION:  -  Lymph node negative for malignancy (0/1).    12/03/2021 -  Anti-estrogen oral therapy   Letrozole  daily x 7 years     CHIEF COMPLIANT: Follow-up on letrozole  therapy  HISTORY OF PRESENT ILLNESS:   History of Present Illness Caitlin Mcneil is a 68 year old female on letrozole  therapy who presents for a follow-up visit.  She has been on letrozole  for two and a half years, experiencing manageable hot flashes primarily in the evening. She is concerned about potential skin thinning and its implications on her colon, especially regarding colonoscopy procedures.  There is a significant family history of colon cancer, with her father and grandfather having died from it, and her brother having had it but is now fine. She has been undergoing colonoscopies every five years since age 89 due to this history, with the last one revealing a single polyp.     ALLERGIES:  is allergic to carbamazepine, penicillins, aspirin, ciprofloxacin, and ibuprofen.  MEDICATIONS:  Current Outpatient Medications  Medication Sig Dispense Refill   acetaminophen  (TYLENOL )  325 MG tablet Take 650 mg by mouth every 6 (six) hours as needed for moderate pain (pain score 4-6).     Apoaequorin (PREVAGEN) 10 MG CAPS Take 40 mg by mouth daily.     B Complex Vitamins (VITAMIN B COMPLEX) TABS Take 1 tablet by mouth every evening.     cetirizine (ZYRTEC) 10 MG tablet Take 10 mg by mouth daily as needed.     fluticasone (FLONASE) 50 MCG/ACT nasal spray Place 2  sprays into both nostrils daily as needed.     Guaifenesin 1200 MG TB12 Take 1,200 mg by mouth daily as needed.     hydrOXYzine  (ATARAX ) 25 MG tablet Take 50 mg by mouth at bedtime.     letrozole  (FEMARA ) 2.5 MG tablet Take 1 tablet (2.5 mg total) by mouth daily. 90 tablet 4   melatonin 1 MG TABS tablet Take 2 mg by mouth at bedtime.     MOUNJARO 5 MG/0.5ML Pen Inject 5 mg into the skin once a week.     Multiple Vitamins-Minerals (MULTIVITAMIN ADULT EXTRA C PO) Take 1 tablet by mouth daily.     pregabalin  (LYRICA ) 100 MG capsule Take 100 mg by mouth 2 (two) times daily.     QUEtiapine  (SEROQUEL ) 100 MG tablet Take 1 tablet (100 mg total) by mouth at bedtime. 30 tablet 0   rosuvastatin (CRESTOR) 20 MG tablet Take 20 mg by mouth at bedtime.     telmisartan  (MICARDIS ) 20 MG tablet Take 1 tablet (20 mg total) by mouth daily.     temazepam  (RESTORIL ) 15 MG capsule Take 1 capsule (15 mg total) by mouth at bedtime as needed for sleep. 30 capsule 0   venlafaxine  XR (EFFEXOR -XR) 75 MG 24 hr capsule TAKE 1 CAPSULE BY MOUTH DAILY WITH BREAKFAST 90 capsule 3   BD PEN NEEDLE NANO 2ND GEN 32G X 4 MM MISC  (Patient not taking: Reported on 01/27/2024)     carboxymethylcellulose (REFRESH PLUS) 0.5 % SOLN Place 1 drop into both eyes daily. (Patient not taking: Reported on 01/27/2024)     No current facility-administered medications for this visit.    PHYSICAL EXAMINATION: ECOG PERFORMANCE STATUS: 1 - Symptomatic but completely ambulatory  Vitals:   01/27/24 1410  BP: 123/74  Pulse: 79  Resp: 17  Temp: 98 F (36.7 C)  SpO2: 98%   Filed Weights   01/27/24 1410  Weight: 193 lb 9.6 oz (87.8 kg)      LABORATORY DATA:  I have reviewed the data as listed    Latest Ref Rng & Units 08/25/2021   11:49 AM 12/24/2014    3:45 AM 12/23/2014    1:40 PM  CMP  Glucose 70 - 99 mg/dL 877  82  895   BUN 8 - 23 mg/dL 21  19  21    Creatinine 0.44 - 1.00 mg/dL 9.26  9.31  9.32   Sodium 135 - 145 mmol/L 140  143  140    Potassium 3.5 - 5.1 mmol/L 4.3  4.3  4.2   Chloride 98 - 111 mmol/L 105  109  106   CO2 22 - 32 mmol/L 30  26  28    Calcium 8.9 - 10.3 mg/dL 9.3  8.7  8.9   Total Protein 6.5 - 8.1 g/dL   6.7   Total Bilirubin 0.3 - 1.2 mg/dL   0.6   Alkaline Phos 38 - 126 U/L   44   AST 15 - 41 U/L   15  ALT 14 - 54 U/L   12     Lab Results  Component Value Date   WBC 7.1 08/25/2021   HGB 13.4 08/25/2021   HCT 40.7 08/25/2021   MCV 90.6 08/25/2021   PLT 152 08/25/2021   NEUTROABS 6.3 09/18/2014    ASSESSMENT & PLAN:  Breast cancer of lower-outer quadrant of right female breast (HCC) 07/02/2021: Left Breast Palpable lesion: 3.2 cm cyst, 2 adj masses 0.6 cm, and 0.7 cm Biopsy 07/02/21: ILC Grade 1, ER: 90%, PR 100%, Her 2: Neg, Ki 67: 2%   08/28/2021: Left lumpectomy: Grade 1 through 2 invasive lobular cancer presenting as few isolated tumor cells, grade 1 DCIS, LCIS, 0/3 lymph nodes negative (tumor size 4 mm) ER 90%, PR 100%, HER2 negative, Ki-67 2%   Treatment plan: 1.  No role of Oncotype DX testing because of the small size 2. adjuvant radiation 10/10/2021-11/06/2021 3.  Adjuvant antiestrogen therapy with letrozole  to start 12/03/2021   She has tapered off and discontinued estradiol  patches.    Letrozole  toxicities: Hot flashes: Mild. Improved with Effexor    Breast cancer surveillance: Mammogram 06/11/2023: Benign postsurgical changes breast density category C Breast MRI 01/13/2024: Postoperative changes and appearance of fat necrosis.  I recommend that we do annual breast MRIs.  Will need to send a prescription for Xanax prior to her breast MRI.  RTC in 1 year      Orders Placed This Encounter  Procedures   MR BREAST BILATERAL W WO CONTRAST INC CAD    Due 01/15/2025   M[facility] EPIC PF: IK:Fjophwjwu neoplasm of lower-outer quadrant of right breast of female, estrogen receptor positive    Standing Status:   Future    Expected Date:   01/15/2025    Expiration Date:   01/26/2025    If  indicated for the ordered procedure, I authorize the administration of contrast media per Radiology protocol:   Yes    What is the patient's sedation requirement?:   No Sedation    Does the patient have a pacemaker or implanted devices?:   No    Radiology Contrast Protocol - do NOT remove file path:   [email or web address]    Preferred imaging location?:   [facility] (table limit-550lbs)    Release to patient:   Immediate   The patient has a good understanding of the overall plan. she agrees with it. she will call with any problems that may develop before the next visit here. Total time spent: 30 mins including face to face time and time spent for planning, charting and co-ordination of care   Viinay K Scott Fix, MD 01/27/24

## 2024-02-03 ENCOUNTER — Other Ambulatory Visit: Payer: Self-pay | Admitting: Hematology and Oncology

## 2024-02-03 DIAGNOSIS — Z9889 Other specified postprocedural states: Secondary | ICD-10-CM

## 2024-02-07 ENCOUNTER — Ambulatory Visit: Payer: Medicare Other | Attending: Surgery

## 2024-02-07 VITALS — Wt 193.0 lb

## 2024-02-07 DIAGNOSIS — Z483 Aftercare following surgery for neoplasm: Secondary | ICD-10-CM | POA: Insufficient documentation

## 2024-02-07 NOTE — Therapy (Signed)
 OUTPATIENT PHYSICAL THERAPY SOZO SCREENING NOTE   Patient Name: Caitlin Mcneil MRN: [id number] DOB:[birth year], 68 y.o., female Today's Date: 02/07/2024  PCP: [name] LABOR, MD REFERRING PROVIDER: Vanderbilt Ned, MD   PT End of Session - 02/07/24 1508     Visit Number 1   # unchanged due to screen only   PT Start Time 1507    PT Stop Time 1511    PT Time Calculation (min) 4 min    Activity Tolerance Patient tolerated treatment well    Behavior During Therapy [facility] for tasks assessed/performed          Past Medical History:  Diagnosis Date   Anxiety    Arthritis    hands   Bipolar 1 disorder (HCC)    Breast cancer (HCC) 03/30/2013   right breast   Breast cancer (HCC) 07/02/2021   Left Breast   Contact lens/glasses fitting    wears contacts or glasses   Depression    Diabetes mellitus without complication (HCC)    type 2   History of radiation therapy 110/10/14-12/26/14   right breast, 60.4Gy   Personal history of radiation therapy 2014   right breast   Past Surgical History:  Procedure Laterality Date   ABDOMINAL HYSTERECTOMY  1996   BREAST BIOPSY Right 03/30/2013   Ductal carcinoma in situ/calcifications,loq   BREAST LUMPECTOMY Right 05/02/2013   BREAST LUMPECTOMY WITH NEEDLE LOCALIZATION AND AXILLARY SENTINEL LYMPH NODE BX Right 05/02/2013   Procedure: BREAST LUMPECTOMY WITH NEEDLE LOCALIZATION AND AXILLARY SENTINEL LYMPH NODE BX;  Surgeon: Ned LABOR. 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, estrogen receptor positive (HCC) 07/13/2021   Bipolar I disorder, most recent episode depressed (HCC) 12/25/2014   Overdose 12/23/2014   Hot flashes not due to menopause 03/14/2014   History of breast cancer 08/29/2013   Breast cancer of lower-outer quadrant of right female breast (HCC) 04/05/2013    REFERRING DIAG: left breast cancer at risk for lymphedema  THERAPY DIAG: Aftercare following surgery for neoplasm  PERTINENT HISTORY: Left lumpectomy (x2 at same time) on 08/28/2021 showed invasive lobular carcinoma and grade 1 DCIS in the upper outer quadrant, LCIS in the inferior quadrant, and axillary lymph nodes negative for carcinoma. Rt breast cancer with lumpectomy and SLNB   PRECAUTIONS: left UE Lymphedema risk  SUBJECTIVE: Pt returns for her first 6 month L-Dex screen.   PAIN:  Are you having pain? No  SOZO SCREENING: Patient was assessed today using the SOZO machine to determine the lymphedema index score. This was compared to her baseline score. It was determined that she is within the recommended range when compared to her baseline and no further action is needed at this time. She will continue SOZO screenings. These are done every 3 months for 2 years post operatively followed by every  6 months for 2 years, and then annually.   L-DEX FLOWSHEETS - 02/07/24 1500       L-DEX LYMPHEDEMA SCREENING   Measurement Type Unilateral    L-DEX MEASUREMENT EXTREMITY Upper Extremity    POSITION  Standing    DOMINANT SIDE Right    At Risk Side Left    BASELINE SCORE (UNILATERAL) -1.6    L-DEX SCORE (UNILATERAL) -3.7    VALUE CHANGE (UNILAT) -2.1         P: Cont 6 month L-Dex screens next.   Berwyn Knights, PTA 02/07/24 3:09 PM

## 2024-06-13 ENCOUNTER — Ambulatory Visit
Admission: RE | Admit: 2024-06-13 | Discharge: 2024-06-13 | Disposition: A | Discharge status: Home / Self Care | Source: Ambulatory Visit | Attending: Hematology and Oncology | Admitting: Hematology and Oncology

## 2024-06-13 DIAGNOSIS — Z9889 Other specified postprocedural states: Secondary | ICD-10-CM

## 2024-07-28 ENCOUNTER — Inpatient Hospital Stay: Payer: Medicare Other | Attending: Adult Health | Admitting: Adult Health

## 2024-07-28 ENCOUNTER — Encounter: Payer: Self-pay | Admitting: Adult Health

## 2024-07-28 VITALS — BP 116/73 | HR 77 | Temp 97.7°F | Resp 18 | Wt 176.5 lb

## 2024-07-28 DIAGNOSIS — C50511 Malignant neoplasm of lower-outer quadrant of right female breast: Secondary | ICD-10-CM | POA: Diagnosis not present

## 2024-07-28 DIAGNOSIS — Z803 Family history of malignant neoplasm of breast: Secondary | ICD-10-CM | POA: Insufficient documentation

## 2024-07-28 DIAGNOSIS — R232 Flushing: Secondary | ICD-10-CM | POA: Diagnosis not present

## 2024-07-28 DIAGNOSIS — Z923 Personal history of irradiation: Secondary | ICD-10-CM | POA: Insufficient documentation

## 2024-07-28 DIAGNOSIS — Z800 Family history of malignant neoplasm of digestive organs: Secondary | ICD-10-CM | POA: Diagnosis not present

## 2024-07-28 DIAGNOSIS — Z170 Estrogen receptor positive status [ER+]: Secondary | ICD-10-CM | POA: Insufficient documentation

## 2024-07-28 DIAGNOSIS — Z79811 Long term (current) use of aromatase inhibitors: Secondary | ICD-10-CM | POA: Insufficient documentation

## 2024-07-28 DIAGNOSIS — Z79899 Other long term (current) drug therapy: Secondary | ICD-10-CM | POA: Insufficient documentation

## 2024-07-28 DIAGNOSIS — C50012 Malignant neoplasm of nipple and areola, left female breast: Secondary | ICD-10-CM | POA: Diagnosis not present

## 2024-07-28 MED ORDER — ALPRAZOLAM 0.25 MG PO TABS: ORAL_TABLET | ORAL | 0 refills | Status: AC

## 2024-07-28 NOTE — Progress Notes (Signed)
 [facility] Cancer Follow up:    [name] LABOR, MD [address] [place] [address]   DIAGNOSIS: Cancer Staging  Breast cancer of lower-outer quadrant of right female breast [facility]) Staging form: Breast, AJCC 7th Edition - Clinical: Stage 0 (Tis (DCIS), N0, cM0) - Unsigned Specimen type: Core Needle Biopsy Histopathologic type: 9932 Laterality: Right Staging comments: Staged at breast conference 04/05/13.  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, estrogen receptor positive (HCC) Staging form: Breast, AJCC 8th Edition - Clinical stage from 07/02/2021: Stage IB (cT2, cN0, cM0, G1, ER+, PR+, HER2-) - Signed by Crawford Morna Pickle, NP on 01/28/2022 Stage prefix: Initial diagnosis Histologic grading system: 3 grade system    SUMMARY OF ONCOLOGIC HISTORY: 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, estrogen receptor positive (HCC)  07/02/2021 Initial Diagnosis   Left Breast Palpable lesion: 3.2 cm cyst, 2 adj masses 0.6 cm, and 0.7 cm Biopsy 07/02/21: ILC Grade 1, ER: 90%, PR 100%, Her 2: Neg, Ki 67: 2%   07/02/2021 Cancer Staging   Staging form: Breast, AJCC 8th Edition - Clinical stage from 07/02/2021: Stage IB (cT2, cN0, cM0, G1, ER+, PR+, HER2-) - Signed by Crawford Morna Pickle, NP on 01/28/2022 Stage prefix: Initial diagnosis Histologic grading system: 3 grade system   10/09/2021 - 11/06/2021 Radiation Therapy   Site Technique Total  Dose (Gy) Dose per Fx (Gy) Completed Fx Beam Energies  Breast, Left: Breast_L 3D 42.56/42.56 2.66 16/16 10X  Breast, Left: Breast_L_Bst 3D 8/8 2 4/4 10X      Surgery   A. BREAST, LEFT UPPER OUTER QUADRANT, LUMPECTOMY:  -  Invasive lobular carcinoma, presenting as few isolated tumor cells,  focally within 1 mm of the medial margin (slide A2).  -  Ductal carcinoma in situ, grade I (low), solid pattern, 5 mm focus, 1  mm from inferior margin.  -  Lobular neoplasia (/LCIS).  -  Prior biopsy site changes.  -  Microcalcifications present.   B. BREAST, LEFT INFERIOR, LUMPECTOMY:  -  No invasive carcinoma identified.  -  Lobular neoplasia (LCIS).  -  Prior biopsy stie changes.  -  Microcalcifications present.   C. LYMPH NODE, LEFT AXILLARY, SENTINEL, EXCISION:  -  Lymph node negative for malignancy (0/1).   D. LYMPH NODE, LEFT AXILLARY, SENTINEL, EXCISION:  -  Lymph node negative for malignancy (0/1).   E. LYMPH NODE, LEFT AXILLARY, SENTINEL, EXCISION:  -  Lymph node negative for malignancy (0/1).    12/03/2021 -  Anti-estrogen oral therapy   Letrozole  daily x 7 years     CURRENT THERAPY: Letrozole   INTERVAL HISTORY:  Discussed the use of AI scribe software for clinical note transcription with the patient, who gave verbal consent to proceed.  History of Present Illness CARMENCITA CUSIC is a 69 year old female with left breast invasive lobular carcinoma, stage 1B, ERPR+, HER2-, status post lumpectomy and adjuvant radiation, currently on adjuvant letrozole , with prior right breast stage 1A carcinoma, presenting for routine oncology surveillance.  Her most recent mammogram on June 13, 2024, showed no evidence of malignancy, with breast density category C. She is on intensified surveillance with alternating mammogram and breast MRI because of recurrent breast cancer. She has significant anxiety with MRI and uses alprazolam  before imaging, typically one dose an hour before and  another before leaving home.  She had bone density testing in December 2025 showing only very slight decrease. She had a complete hysterectomy and is unsure about her ovarian status. She has recent weight loss and is increasing treadmill exercise but had an episode of left knee instability. She undergoes regular colonoscopies due to family history of colon cancer.     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  MEDICAL HISTORY: Past Medical History:  Diagnosis Date   Anxiety    Arthritis    hands   Bipolar 1 disorder (HCC)    Breast cancer (HCC) 03/30/2013   right breast   Breast cancer (HCC) 07/02/2021   Left Breast   Contact lens/glasses fitting    wears contacts or glasses   Depression    Diabetes mellitus without complication (HCC)    type 2   History of radiation therapy 110/10/14-12/26/14   right breast, 60.4Gy   Personal history of radiation therapy 2014   right breast    SURGICAL HISTORY: Past Surgical History:  Procedure Laterality Date   ABDOMINAL HYSTERECTOMY  1996   BREAST BIOPSY Right 03/30/2013   Ductal carcinoma in situ/calcifications,loq   BREAST LUMPECTOMY Right 05/02/2013   BREAST LUMPECTOMY Left 2023   x2   BREAST LUMPECTOMY WITH NEEDLE LOCALIZATION AND AXILLARY SENTINEL LYMPH NODE BX Right 05/02/2013   Procedure: BREAST LUMPECTOMY WITH NEEDLE LOCALIZATION AND AXILLARY SENTINEL LYMPH NODE BX;  Surgeon: Debby LABOR. Cornett, MD;  Location: [facility];  Service: General;   Laterality: Right;   BREAST LUMPECTOMY WITH RADIOACTIVE SEED AND SENTINEL LYMPH NODE BIOPSY Left 08/28/2021   Procedure: LEFT BREAST LUMPECTOMY WITH RADIOACTIVE SEED AND SENTINEL LYMPH NODE BIOPSY;  Surgeon: Vanderbilt Debby, MD;  Location: [facility];  Service: General;  Laterality: Left;   FOOT SURGERY  2002   lt hammer toe-reconstruction   HARDWARE REMOVAL  2002   lt  foot hardware out, pt thinks she still has a pin in her left ankle   WISDOM TOOTH EXTRACTION      SOCIAL HISTORY: Social History   Socioeconomic History   Marital status: Married    Spouse name: Not on file   Number of children: 1   Years of education: Not on file   Highest education level: Not on file  Occupational History    Employer: [employer]  Tobacco Use   Smoking status: Never   Smokeless tobacco: Never  Vaping Use   Vaping status: Never Used  Substance and Sexual Activity   Alcohol use: No   Drug use: No   Sexual activity: Not Currently  Other Topics Concern   Not on file  Social History Narrative   Not on file   Social Drivers of Health  Tobacco Use: Low Risk (07/28/2024)   Patient History    Smoking Tobacco Use: Never    Smokeless Tobacco Use: Never    Passive Exposure: Not on file  Financial Resource Strain: Not on file  Food Insecurity: Not on file  Transportation Needs: Not on file  Physical Activity: Not on file  Stress: Not on file  Social Connections: Not on file  Intimate Partner Violence: Not At Risk (08/07/2021)   Humiliation, Afraid, Rape, and Kick questionnaire    Fear of Current or Ex-Partner: No    Emotionally Abused: No    Physically Abused: No    Sexually Abused: No  Depression (PHQ2-9): Low Risk (01/27/2024)   Depression (PHQ2-9)    PHQ-2 Score: 0  Alcohol Screen: Not on file  Housing: Not on file  Utilities: Not on file  Health Literacy: Not on file    FAMILY HISTORY: Family History  Problem Relation Age of Onset   Colon cancer Father 34   Alzheimer's disease  Mother    Alzheimer's disease Maternal Grandmother    Cancer Maternal Grandfather        Cancer - NOS   Breast cancer Other        maternal great aunt    Review of Systems  Constitutional:  Negative for appetite change, chills, fatigue, fever and unexpected weight change.  HENT:   Negative for hearing loss, lump/mass and trouble swallowing.   Eyes:  Negative for eye problems and icterus.  Respiratory:  Negative for chest tightness, cough and shortness of breath.   Cardiovascular:  Negative for chest pain, leg swelling and palpitations.  Gastrointestinal:  Negative for abdominal distention, abdominal pain, constipation, diarrhea, nausea and vomiting.  Endocrine: Negative for hot flashes.  Genitourinary:  Negative for difficulty urinating.   Musculoskeletal:  Negative for arthralgias.  Skin:  Negative for itching and rash.  Neurological:  Negative for dizziness, extremity weakness, headaches and numbness.  Hematological:  Negative for adenopathy. Does not bruise/bleed easily.  Psychiatric/Behavioral:  Negative for depression. The patient is not nervous/anxious.       PHYSICAL EXAMINATION    Vitals:   07/28/24 1424  BP: 116/73  Pulse: 77  Resp: 18  Temp: 97.7 F (36.5 C)  SpO2: 96%    Physical Exam Constitutional:      General: She is not in acute distress.    Appearance: Normal appearance. She is not toxic-appearing.  HENT:     Head: Normocephalic and atraumatic.     Mouth/Throat:     Mouth: Mucous membranes are moist.     Pharynx: Oropharynx is clear. No oropharyngeal exudate or posterior oropharyngeal erythema.  Eyes:     General: No scleral icterus. Cardiovascular:     Rate and Rhythm: Normal rate and regular rhythm.     Pulses: Normal pulses.     Heart sounds: Normal heart sounds.  Pulmonary:     Effort: Pulmonary effort is normal.     Breath sounds: Normal breath sounds.  Chest:     Comments: Right breast s/p lumpectomy and radiation, no sign of local  recurrence; left breast s/p lumpectomy and radiation, no sign of local recurrence Abdominal:     General: Abdomen is flat. Bowel sounds are normal. There is no distension.     Palpations: Abdomen is soft.     Tenderness: There is no abdominal tenderness.  Musculoskeletal:        General: No swelling.     Cervical back: Neck supple.  Lymphadenopathy:  Cervical: No cervical adenopathy.     Upper Body:     Right upper body: No supraclavicular or axillary adenopathy.     Left upper body: No supraclavicular or axillary adenopathy.  Skin:    General: Skin is warm and dry.     Findings: No rash.  Neurological:     General: No focal deficit present.     Mental Status: She is alert.  Psychiatric:        Mood and Affect: Mood normal.        Behavior: Behavior normal.    ASSESSMENT and THERAPY PLAN:   No problem-specific Assessment & Plan notes found for this encounter.   Assessment and Plan Assessment & Plan Stage IB invasive lobular carcinoma of the left breast, ERPR positive, HER2 negative, status post lumpectomy and adjuvant radiation, on adjuvant letrozole  therapy No evidence of recurrence. Tolerating letrozole  with mild hot flashes. Bone health monitoring needed due to aromatase inhibitor therapy. - Ordered breast MRI for June 2026 for intensified surveillance. - Prescribed alprazolam  for MRI-related anxiety, to be used with a driver present. - Advised continuation of letrozole  until therapy duration clarified. - Requested bone density test results from gynecologist for bone health monitoring. - Reinforced importance of healthy diet and exercise for cancer survivorship.  Stage 1A breast cancer of the right breast, status post lumpectomy and radiation Remote history of stage 1A breast cancer with no recurrence on current surveillance. - Continue routine surveillance with annual mammogram and alternating breast MRI as indicated.  RTC in 6 months for labs and f/u with Dr.  Gudena.  All questions were answered. The patient knows to call the clinic with any problems, questions or concerns. We can certainly see the patient much sooner if necessary.  Total encounter time:30 minutes*in face-to-face visit time, chart review, lab review, care coordination, order entry, and documentation of the encounter time.    Morna Kendall, NP 07/28/2024 2:58 PM Medical Oncology and Hematology [facility] [address] [place], [place] [address] Tel. [phone]    Fax. [phone]  *Total Encounter Time as defined by the Centers for Medicare and Medicaid Services includes, in addition to the face-to-face time of a patient visit (documented in the note above) non-face-to-face time: obtaining and reviewing outside history, ordering and reviewing medications, tests or procedures, care coordination (communications with other health care professionals or caregivers) and documentation in the medical record.

## 2024-08-07 ENCOUNTER — Ambulatory Visit: Attending: Surgery

## 2024-08-07 VITALS — Wt 173.2 lb

## 2024-08-07 DIAGNOSIS — Z483 Aftercare following surgery for neoplasm: Secondary | ICD-10-CM | POA: Insufficient documentation

## 2024-08-07 NOTE — Therapy (Signed)
 " OUTPATIENT PHYSICAL THERAPY SOZO SCREENING NOTE   Patient Name: Caitlin Mcneil MRN: [id number] DOB:06-10-1956, 69 y.o., female Today's Date: 08/07/2024  PCP: [name] LABOR, MD REFERRING PROVIDER: Vanderbilt Ned, MD   PT End of Session - 08/07/24 1505     Visit Number 1   # unchanged due to screen only   PT Start Time 1504    PT Stop Time 1508    PT Time Calculation (min) 4 min    Activity Tolerance Patient tolerated treatment well    Behavior During Therapy [facility] for tasks assessed/performed          Past Medical History:  Diagnosis Date   Anxiety    Arthritis    hands   Bipolar 1 disorder (HCC)    Breast cancer (HCC) 03/30/2013   right breast   Breast cancer (HCC) 07/02/2021   Left Breast   Contact lens/glasses fitting    wears contacts or glasses   Depression    Diabetes mellitus without complication (HCC)    type 2   History of radiation therapy 110/10/14-12/26/14   right breast, 60.4Gy   Personal history of radiation therapy 2014   right breast   Past Surgical History:  Procedure Laterality Date   ABDOMINAL HYSTERECTOMY  1996   BREAST BIOPSY Right 03/30/2013   Ductal carcinoma in situ/calcifications,loq   BREAST LUMPECTOMY Right 05/02/2013   BREAST LUMPECTOMY Left 2023   x2   BREAST LUMPECTOMY WITH NEEDLE LOCALIZATION AND AXILLARY SENTINEL LYMPH NODE BX Right 05/02/2013   Procedure: BREAST LUMPECTOMY WITH NEEDLE LOCALIZATION AND AXILLARY SENTINEL LYMPH NODE BX;  Surgeon: Ned LABOR. Cornett, MD;  Location: [facility];  Service: General;  Laterality: Right;   BREAST LUMPECTOMY WITH RADIOACTIVE SEED AND SENTINEL LYMPH NODE BIOPSY Left 08/28/2021   Procedure: LEFT BREAST LUMPECTOMY WITH RADIOACTIVE SEED AND SENTINEL LYMPH NODE BIOPSY;  Surgeon: Vanderbilt Ned, MD;  Location: [facility] OR;  Service: General;  Laterality: Left;   FOOT SURGERY  2002   lt hammer toe-reconstruction   HARDWARE REMOVAL  2002   lt  foot hardware out, pt thinks she  still has a pin in her left ankle   WISDOM TOOTH EXTRACTION     Patient Active Problem List   Diagnosis Date Noted   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, estrogen receptor positive (HCC) 07/13/2021   Bipolar I disorder, most recent episode depressed (HCC) 12/25/2014   Overdose 12/23/2014   Hot flashes not due to menopause 03/14/2014   History of breast cancer 08/29/2013   Breast cancer of lower-outer quadrant of right female breast (HCC) 04/05/2013    REFERRING DIAG: left breast cancer at risk for lymphedema  THERAPY DIAG: Aftercare following surgery for neoplasm  PERTINENT HISTORY: Left lumpectomy (x2 at same time) on 08/28/2021 showed invasive lobular carcinoma and grade 1 DCIS in the upper outer quadrant, LCIS in the inferior quadrant, and axillary lymph nodes negative for carcinoma. Rt breast cancer with lumpectomy and SLNB in 2014  PRECAUTIONS: left UE Lymphedema risk  SUBJECTIVE: Pt returns for her 6 month L-Dex screen.   PAIN:  Are you having pain? No  SOZO SCREENING: Patient was assessed today using the SOZO machine to determine the lymphedema index score. This was compared to her baseline score. It was determined that she is within the recommended range when compared to her baseline and no further action is needed at this time. She will continue SOZO screenings. These are done every  3 months for 2 years post operatively followed by every 6 months for 2 years, and then annually.   L-DEX FLOWSHEETS - 08/07/24 1500       L-DEX LYMPHEDEMA SCREENING   Measurement Type Unilateral    L-DEX MEASUREMENT EXTREMITY Upper Extremity    POSITION  Standing    DOMINANT SIDE Right    At Risk Side Left    BASELINE SCORE (UNILATERAL) -1.6    L-DEX SCORE (UNILATERAL) -2.1    VALUE CHANGE (UNILAT) -0.5         P: Cont 6 month L-Dex screens until 08/2025.   Berwyn Knights, PTA 08/07/2024 3:08 PM    "

## 2025-01-15 ENCOUNTER — Other Ambulatory Visit

## 2025-01-29 ENCOUNTER — Ambulatory Visit: Admitting: Hematology and Oncology

## 2025-02-05 ENCOUNTER — Ambulatory Visit: Attending: Surgery
